# Patient Record
Sex: Female | Born: 1990 | Race: White | Hispanic: No | Smoking: Current every day smoker
Health system: Southern US, Community
[De-identification: ages and names within clinical notes are randomized; demographics above are authoritative.]

## PROBLEM LIST (undated history)

## (undated) ENCOUNTER — Inpatient Hospital Stay (HOSPITAL_COMMUNITY): Payer: Self-pay

## (undated) DIAGNOSIS — N390 Urinary tract infection, site not specified: Secondary | ICD-10-CM

## (undated) DIAGNOSIS — N898 Other specified noninflammatory disorders of vagina: Secondary | ICD-10-CM

## (undated) DIAGNOSIS — R519 Headache, unspecified: Secondary | ICD-10-CM

## (undated) DIAGNOSIS — Z3490 Encounter for supervision of normal pregnancy, unspecified, unspecified trimester: Secondary | ICD-10-CM

## (undated) DIAGNOSIS — O021 Missed abortion: Secondary | ICD-10-CM

## (undated) DIAGNOSIS — F419 Anxiety disorder, unspecified: Secondary | ICD-10-CM

## (undated) DIAGNOSIS — F32A Depression, unspecified: Secondary | ICD-10-CM

## (undated) DIAGNOSIS — R51 Headache: Secondary | ICD-10-CM

## (undated) HISTORY — DX: Missed abortion: O02.1

## (undated) HISTORY — PX: DILATION AND CURETTAGE OF UTERUS: SHX78

## (undated) HISTORY — DX: Depression, unspecified: F32.A

## (undated) HISTORY — DX: Other specified noninflammatory disorders of vagina: N89.8

## (undated) HISTORY — DX: Urinary tract infection, site not specified: N39.0

## (undated) HISTORY — DX: Encounter for supervision of normal pregnancy, unspecified, unspecified trimester: Z34.90

---
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
## 2011-04-16 ENCOUNTER — Emergency Department (HOSPITAL_COMMUNITY)
Admission: EM | Admit: 2011-04-16 | Discharge: 2011-04-17 | Disposition: A | Discharge status: Home / Self Care | Payer: Medicaid Other | Attending: Emergency Medicine | Admitting: Emergency Medicine

## 2011-04-16 DIAGNOSIS — N12 Tubulo-interstitial nephritis, not specified as acute or chronic: Secondary | ICD-10-CM | POA: Insufficient documentation

## 2011-04-16 DIAGNOSIS — F17200 Nicotine dependence, unspecified, uncomplicated: Secondary | ICD-10-CM | POA: Insufficient documentation

## 2011-04-16 LAB — DIFFERENTIAL
Basophils Absolute: 0 10*3/uL (ref 0.0–0.1)
Basophils Relative: 0 % (ref 0–1)
Eosinophils Absolute: 0.1 10*3/uL (ref 0.0–0.7)
Eosinophils Relative: 1 % (ref 0–5)
Lymphocytes Relative: 37 % (ref 12–46)
Lymphs Abs: 3.7 10*3/uL (ref 0.7–4.0)
Monocytes Absolute: 0.8 10*3/uL (ref 0.1–1.0)
Neutro Abs: 5.4 10*3/uL (ref 1.7–7.7)
Neutrophils Relative %: 54 % (ref 43–77)

## 2011-04-16 LAB — CBC
Hemoglobin: 14.6 g/dL (ref 12.0–15.0)
MCV: 87.7 fL (ref 78.0–100.0)
Platelets: 217 10*3/uL (ref 150–400)
RBC: 5.11 MIL/uL (ref 3.87–5.11)
RDW: 13.4 % (ref 11.5–15.5)
WBC: 10.1 10*3/uL (ref 4.0–10.5)

## 2011-04-16 LAB — BASIC METABOLIC PANEL
CO2: 24 mEq/L (ref 19–32)
Chloride: 103 mEq/L (ref 96–112)
Creatinine, Ser: 0.79 mg/dL (ref 0.50–1.10)
Potassium: 3.8 mEq/L (ref 3.5–5.1)
Sodium: 140 mEq/L (ref 135–145)

## 2011-04-16 LAB — URINALYSIS, ROUTINE W REFLEX MICROSCOPIC
Bilirubin Urine: NEGATIVE
Glucose, UA: NEGATIVE mg/dL
Ketones, ur: NEGATIVE mg/dL
Nitrite: POSITIVE — AB
Specific Gravity, Urine: 1.025 (ref 1.005–1.030)
pH: 7 (ref 5.0–8.0)

## 2011-04-16 LAB — PREGNANCY, URINE: Preg Test, Ur: NEGATIVE

## 2011-04-16 LAB — URINE MICROSCOPIC-ADD ON

## 2011-04-16 MED ORDER — CIPROFLOXACIN HCL 500 MG PO TABS: 500.0000 mg | ORAL_TABLET | Freq: Two times a day (BID) | ORAL | Status: AC

## 2011-04-16 MED ORDER — HYDROCODONE-ACETAMINOPHEN 5-325 MG PO TABS
1.0000 | ORAL_TABLET | Freq: Once | ORAL | Status: AC
  Administered 2011-04-16: 1 via ORAL

## 2011-04-16 MED ORDER — ONDANSETRON HCL 4 MG/2ML IJ SOLN
4.0000 mg | Freq: Once | INTRAMUSCULAR | Status: AC
  Administered 2011-04-16: 4 mg via INTRAVENOUS
  Filled 2011-04-16: qty 2

## 2011-04-16 MED ORDER — KETOROLAC TROMETHAMINE 30 MG/ML IJ SOLN
30.0000 mg | Freq: Once | INTRAMUSCULAR | Status: AC
  Administered 2011-04-16: 30 mg via INTRAVENOUS
  Filled 2011-04-16: qty 1

## 2011-04-16 MED ORDER — HYDROCODONE-ACETAMINOPHEN 5-325 MG PO TABS
1.0000 | ORAL_TABLET | Freq: Once | ORAL | Status: DC
  Filled 2011-04-16 (×2): qty 1

## 2011-04-16 MED ORDER — ONDANSETRON HCL 4 MG PO TABS: 4.0000 mg | ORAL_TABLET | Freq: Four times a day (QID) | ORAL | Status: AC

## 2011-04-16 MED ORDER — DEXTROSE 5 % IV SOLN
1.0000 g | Freq: Once | INTRAVENOUS | Status: AC
  Administered 2011-04-16: 21:00:00 via INTRAVENOUS
  Filled 2011-04-16: qty 10

## 2011-04-16 MED ORDER — SODIUM CHLORIDE 0.9 % IV BOLUS (SEPSIS)
1000.0000 mL | Freq: Once | INTRAVENOUS | Status: AC
  Administered 2011-04-16: 1000 mL via INTRAVENOUS

## 2011-04-16 MED ORDER — HYDROCODONE-ACETAMINOPHEN 5-325 MG PO TABS: 2.0000 | ORAL_TABLET | ORAL | Status: AC | PRN

## 2011-04-16 NOTE — ED Notes (Signed)
Patient was given a sprite to drink. Patient tolerated  Well.

## 2011-04-16 NOTE — ED Provider Notes (Signed)
History     CSN: [id number] Arrival date & time: 04/16/2011  7:54 PM   First MD Initiated Contact with Patient 04/16/11 2026      Chief Complaint  Patient presents with  . Abdominal Pain  . Flank Pain  . Dysuria    (Consider location/radiation/quality/duration/timing/severity/associated sxs/prior treatment) HPI Comments: Patient presents with one week of suprapubic pain that has been coming and going but has become more persistent in the past couple days. Today she first noticed some dysuria and pain radiating up to her left flank. She endorses nausea but has not had any vomiting. She denies any vaginal bleeding or discharge, and her last menstrual period was earlier this month. She has implanon birth control.  She is sexually active and uses protection occasionally. Denies any chest pain, shortness of breath, fever. Her abdominal pain is localized to the suprapubic region. There no provoking or palliative factors. She is using Tylenol at home for the pain.  The history is provided by the patient.    History reviewed. No pertinent past medical history.  History reviewed. No pertinent past surgical history.  No family history on file.  History  Substance Use Topics  . Smoking status: Current Everyday Smoker -- 0.5 packs/day  . Smokeless tobacco: Not on file  . Alcohol Use: No    OB History    Grav Para Term Preterm Abortions TAB SAB Ect Mult Living                  Review of Systems  Constitutional: Positive for chills, activity change and appetite change. Negative for fever.  HENT: Negative for congestion and rhinorrhea.   Respiratory: Negative for cough, chest tightness and shortness of breath.   Cardiovascular: Negative for chest pain.  Gastrointestinal: Positive for nausea and abdominal pain. Negative for vomiting and diarrhea.  Genitourinary: Positive for dysuria, urgency, frequency, flank pain and pelvic pain. Negative for vaginal bleeding and vaginal discharge.    Musculoskeletal: Positive for back pain.  Skin: Negative for rash.  Neurological: Negative for headaches.    Allergies  Codeine  Home Medications   Current Outpatient Rx  Name Route Sig Dispense Refill  . CIPROFLOXACIN HCL 500 MG PO TABS Oral Take 1 tablet (500 mg total) by mouth every 12 (twelve) hours. 28 tablet 0  . HYDROCODONE-ACETAMINOPHEN 5-325 MG PO TABS Oral Take 2 tablets by mouth every 4 (four) hours as needed for pain. 10 tablet 0  . ONDANSETRON HCL 4 MG PO TABS Oral Take 1 tablet (4 mg total) by mouth every 6 (six) hours. 12 tablet 0    BP 125/64  Pulse 88  Temp(Src) 98.2 F (36.8 C) (Oral)  Resp 18  Ht 5\' 3"  (1.6 m)  Wt 135 lb (61.236 kg)  BMI 23.91 kg/m2  SpO2 100%  LMP 03/05/2011  Physical Exam  Constitutional: She appears well-developed and well-nourished. No distress.  HENT:  Head: Normocephalic and atraumatic.  Mouth/Throat: Oropharynx is clear and moist. No oropharyngeal exudate.  Eyes: Conjunctivae are normal. Pupils are equal, round, and reactive to light.  Neck: Normal range of motion.  Cardiovascular: Normal rate, regular rhythm and normal heart sounds.   Pulmonary/Chest: Effort normal and breath sounds normal. No respiratory distress.  Abdominal: Soft. There is tenderness. There is no rebound and no guarding.       Suprapubic tenderness that is moderate. No guarding no rebound no pain at McBurney's point no pain or Murphy's point  Musculoskeletal: She exhibits tenderness.  Left CVA tenderness  Neurological: She is alert. No cranial nerve deficit.  Skin: Skin is warm.    ED Course  Procedures (including critical care time)  Labs Reviewed  URINALYSIS, ROUTINE W REFLEX MICROSCOPIC - Abnormal; Notable for the following:    Appearance CLOUDY (*)    Hgb urine dipstick TRACE (*)    Nitrite POSITIVE (*)    Leukocytes, UA LARGE (*)    All other components within normal limits  URINE MICROSCOPIC-ADD ON - Abnormal; Notable for the following:     Squamous Epithelial / LPF FEW (*)    Bacteria, UA MANY (*)    All other components within normal limits  PREGNANCY, URINE  CBC  DIFFERENTIAL  BASIC METABOLIC PANEL  WET PREP, GENITAL  GC/CHLAMYDIA PROBE AMP, GENITAL  URINE CULTURE   No results found.   1. Pyelonephritis       MDM  Suprapubic pain with flank pain and dysuria. Presentation is concerning for p pyelonephritis, kidney stone, urinary tract infection, cervicitis, PID.  Patient is refusing pelvic exam.  Urinalysis is grossly infected. With flank pain, will treat for pyelonephritis with IV Rocephin in the ED. She is tolerating by mouth and will be able to be discharged on by mouth antibiotics.        Glynn Octave, MD 04/16/11 [phone]

## 2011-04-16 NOTE — ED Notes (Signed)
Pt presents with abd pain, flank pain, and painful urination. Pt states painful urination started approx 2 days ago. Pt also states she is nauseated.

## 2011-04-16 NOTE — ED Notes (Signed)
Pt refusing pelvic exam.

## 2011-04-19 LAB — URINE CULTURE
Colony Count: >=100000
Culture  Setup Time: 201211191245

## 2011-04-20 NOTE — ED Notes (Signed)
Positive URNC- Treated per protocol  

## 2011-11-14 ENCOUNTER — Emergency Department (HOSPITAL_COMMUNITY)
Admission: EM | Admit: 2011-11-14 | Discharge: 2011-11-14 | Discharge status: Against Medical Advice | Payer: Self-pay | Attending: Emergency Medicine | Admitting: Emergency Medicine

## 2011-11-14 ENCOUNTER — Encounter (HOSPITAL_COMMUNITY): Payer: Self-pay

## 2011-11-14 DIAGNOSIS — R1011 Right upper quadrant pain: Secondary | ICD-10-CM | POA: Insufficient documentation

## 2011-11-14 DIAGNOSIS — R110 Nausea: Secondary | ICD-10-CM | POA: Insufficient documentation

## 2011-11-14 DIAGNOSIS — R109 Unspecified abdominal pain: Secondary | ICD-10-CM

## 2011-11-14 LAB — URINALYSIS, ROUTINE W REFLEX MICROSCOPIC
Bilirubin Urine: NEGATIVE
Glucose, UA: NEGATIVE mg/dL
Ketones, ur: NEGATIVE mg/dL
Protein, ur: NEGATIVE mg/dL
pH: 6 (ref 5.0–8.0)

## 2011-11-14 LAB — URINE MICROSCOPIC-ADD ON

## 2011-11-14 NOTE — ED Notes (Signed)
Pt instructed in getting an urine sample for lab, states upper abd pain for a month with nausea, denies vomiting or diarrhea

## 2011-11-14 NOTE — ED Notes (Signed)
Pt c/o upper abd pain x 1 month.  Says feels nauseated and has been up all night due to pain.  C/O feeling dizzy.

## 2011-11-14 NOTE — ED Notes (Signed)
Staff not aware that pt had left AMA, room empty, noted per EDP's note that he is aware of pt's leaving

## 2011-11-14 NOTE — ED Provider Notes (Signed)
History   This chart was scribed for Ann Jakes, MD by Shari Heritage. The patient was seen in room APA09/APA09. Patient's care was started at 1731.     CSN: [id number]  Arrival date & time 11/14/11  1731   First MD Initiated Contact with Patient 11/14/11 2204      Chief Complaint  Patient presents with  . Abdominal Pain    (Consider location/radiation/quality/duration/timing/severity/associated sxs/prior treatment) Patient is a 21 y.o. female presenting with abdominal pain. The history is provided by the patient. No language interpreter was used.  Abdominal Pain The primary symptoms of the illness include abdominal pain and nausea. The primary symptoms of the illness do not include fever, shortness of breath, vomiting, diarrhea or dysuria. The current episode started more than 2 days ago.  The abdominal pain began more than 2 days ago. The abdominal pain has been unchanged since its onset. The abdominal pain is located in the RUQ. The abdominal pain does not radiate. The severity of the abdominal pain is 6/10. The abdominal pain is relieved by nothing.  The patient states that she believes she is currently not pregnant. Additional symptoms associated with the illness include back pain. Symptoms associated with the illness do not include chills.   Ann Moore is a 21 y.o. female who presents to the Emergency Department complaining of constant, moderate to severe RUQ abdominal pain onset 1 month ago with associated nausea and dizziness onset several days ago. Patient ranks pain as 6/10. Patient says pain does not radiate. Patient has had no episodes of emesis or diarrhea. Patient has also experienced HA and back pain. Patient denies diarrhea, vomiting, fever, sore throat, congestion, SOB, chest pain, neck pain, rash, no problems with bleeding easily. Patient's LMS was 11/07/2011. Patient is a current everyday smoker. Patient is allergic to hydrocodone.  History reviewed. No  pertinent past medical history.  History reviewed. No pertinent past surgical history.  No family history on file.  History  Substance Use Topics  . Smoking status: Current Everyday Smoker -- 0.5 packs/day  . Smokeless tobacco: Not on file  . Alcohol Use: No    OB History    Grav Para Term Preterm Abortions TAB SAB Ect Mult Living                  Review of Systems  Constitutional: Negative for fever and chills.  HENT: Negative for congestion, sore throat and neck pain.   Eyes: Negative for visual disturbance.  Respiratory: Negative for shortness of breath.   Cardiovascular: Negative for chest pain.  Gastrointestinal: Positive for nausea and abdominal pain. Negative for vomiting and diarrhea.  Genitourinary: Negative for dysuria.  Musculoskeletal: Positive for back pain.  Skin: Negative for rash.  Neurological: Positive for headaches.   Patient is positive for HA and back pain.  Patient is negative for visual disturbance, diarrhea, vomiting, fever, sore throat, congestion, SOB, chest pain, neck pain, and rash.  Allergies  Codeine and Hydrocodone  Home Medications   Current Outpatient Rx  Name Route Sig Dispense Refill  . ACETAMINOPHEN 500 MG PO TABS Oral Take 1,000 mg by mouth as needed. For pain    . ETONOGESTREL 68 MG [place] IMPL Subcutaneous Inject 1 each into the skin once.      BP 136/87  Pulse 108  Temp 98.5 F (36.9 C) (Oral)  Resp 18  Ht 5\' 4"  (1.626 m)  Wt 140 lb (63.504 kg)  BMI 24.03 kg/m2  SpO2 100%  LMP  11/07/2011  Physical Exam  Nursing note and vitals reviewed. Constitutional: She is oriented to person, place, and time. She appears well-developed and well-nourished.  HENT:  Head: Normocephalic and atraumatic.  Eyes: Conjunctivae and EOM are normal. Pupils are equal, round, and reactive to light.  Neck: Normal range of motion. Neck supple.  Cardiovascular: Normal rate, regular rhythm and normal heart sounds.   No murmur  heard. Pulmonary/Chest: Effort normal and breath sounds normal. No respiratory distress. She has no wheezes. She has no rales.  Abdominal: Soft. Bowel sounds are normal. There is tenderness (RUQ). There is guarding.  Musculoskeletal: Normal range of motion.  Neurological: She is alert and oriented to person, place, and time.  Skin: Skin is warm and dry.  Psychiatric: She has a normal mood and affect.    ED Course  Procedures (including critical care time) DIAGNOSTIC STUDIES: Oxygen Saturation is 100% on room air, normal by my interpretation.    COORDINATION OF CARE: 10:12PM- Patient informed of current plan for treatment and evaluation and agrees with plan at this time. Patient has tenderness in RUQ above gallbladder. Patient is leaving ED against medical advice.   Results for orders placed during the hospital encounter of 11/14/11  URINALYSIS, ROUTINE W REFLEX MICROSCOPIC      Component Value Range   Color, Urine YELLOW  YELLOW   APPearance CLEAR  CLEAR   Specific Gravity, Urine 1.025  1.005 - 1.030   pH 6.0  5.0 - 8.0   Glucose, UA NEGATIVE  NEGATIVE mg/dL   Hgb urine dipstick SMALL (*) NEGATIVE   Bilirubin Urine NEGATIVE  NEGATIVE   Ketones, ur NEGATIVE  NEGATIVE mg/dL   Protein, ur NEGATIVE  NEGATIVE mg/dL   Urobilinogen, UA 0.2  0.0 - 1.0 mg/dL   Nitrite NEGATIVE  NEGATIVE   Leukocytes, UA NEGATIVE  NEGATIVE  PREGNANCY, URINE      Component Value Range   Preg Test, Ur NEGATIVE  NEGATIVE  URINE MICROSCOPIC-ADD ON      Component Value Range   Squamous Epithelial / LPF MANY (*) RARE   WBC, UA 0-2  <3 WBC/hpf   RBC / HPF 0-2  <3 RBC/hpf   Bacteria, UA FEW (*) RARE   No results found.   1. Abdominal pain       MDM  Patient insists on leaving AMA stated the patient at the concern is for acute cholecystitis she's had the upper quadrant abdominal pain for a month and now has a sniffing and tenderness in right upper quadrant with guarding symptoms could be related to  gallstones and now is gone into acute cholecystitis patient does know that she could die from this insists that she needs to go home which would come back another day. Was unable to talk patient into staying.      I personally performed the services described in this documentation, which was scribed in my presence. The recorded information has been reviewed and considered.     Ann Jakes, MD 11/14/11 2226

## 2011-11-14 NOTE — ED Notes (Signed)
MD at bedside. 

## 2011-11-18 ENCOUNTER — Encounter (HOSPITAL_COMMUNITY): Payer: Self-pay | Admitting: *Deleted

## 2011-11-18 ENCOUNTER — Emergency Department (HOSPITAL_COMMUNITY)
Admission: EM | Admit: 2011-11-18 | Discharge: 2011-11-18 | Disposition: A | Discharge status: Home / Self Care | Payer: Self-pay | Attending: Emergency Medicine | Admitting: Emergency Medicine

## 2011-11-18 DIAGNOSIS — R110 Nausea: Secondary | ICD-10-CM | POA: Insufficient documentation

## 2011-11-18 DIAGNOSIS — F17200 Nicotine dependence, unspecified, uncomplicated: Secondary | ICD-10-CM | POA: Insufficient documentation

## 2011-11-18 DIAGNOSIS — R197 Diarrhea, unspecified: Secondary | ICD-10-CM | POA: Insufficient documentation

## 2011-11-18 DIAGNOSIS — R109 Unspecified abdominal pain: Secondary | ICD-10-CM | POA: Insufficient documentation

## 2011-11-18 LAB — COMPREHENSIVE METABOLIC PANEL
ALT: 9 U/L (ref 0–35)
AST: 16 U/L (ref 0–37)
Alkaline Phosphatase: 89 U/L (ref 39–117)
CO2: 24 mEq/L (ref 19–32)
Calcium: 10.1 mg/dL (ref 8.4–10.5)
GFR calc Af Amer: >90 mL/min (ref >90)
GFR calc non Af Amer: >90 mL/min (ref >90)
Glucose, Bld: 98 mg/dL (ref 70–99)
Potassium: 3.6 mEq/L (ref 3.5–5.1)
Sodium: 136 mEq/L (ref 135–145)
Total Protein: 7.8 g/dL (ref 6.0–8.3)

## 2011-11-18 LAB — CBC
Hemoglobin: 15.1 g/dL — ABNORMAL HIGH (ref 12.0–15.0)
Platelets: 218 10*3/uL (ref 150–400)
RBC: 5.11 MIL/uL (ref 3.87–5.11)

## 2011-11-18 MED ORDER — ONDANSETRON 8 MG PO TBDP
8.0000 mg | ORAL_TABLET | Freq: Once | ORAL | Status: AC
  Administered 2011-11-18: 8 mg via ORAL
  Filled 2011-11-18: qty 1

## 2011-11-18 MED ORDER — OXYCODONE-ACETAMINOPHEN 5-325 MG PO TABS: 1.0000 | ORAL_TABLET | ORAL | Status: AC | PRN

## 2011-11-18 MED ORDER — OXYCODONE-ACETAMINOPHEN 5-325 MG PO TABS
2.0000 | ORAL_TABLET | Freq: Once | ORAL | Status: AC
  Administered 2011-11-18: 2 via ORAL
  Filled 2011-11-18: qty 2

## 2011-11-18 NOTE — Discharge Instructions (Signed)

## 2011-11-18 NOTE — ED Provider Notes (Signed)
History     CSN: [id number]  Arrival date & time 11/18/11  1309   First MD Initiated Contact with Patient 11/18/11 1504      Chief Complaint  Patient presents with  . Abdominal Pain     Patient is a 21 y.o. female presenting with abdominal pain. The history is provided by the patient.  Abdominal Pain The primary symptoms of the illness include abdominal pain, nausea and diarrhea. The primary symptoms of the illness do not include fever, shortness of breath, vomiting, hematemesis, hematochezia, dysuria, vaginal discharge or vaginal bleeding. The current episode started more than 2 days ago. The onset of the illness was gradual. The problem has been gradually worsening.  Symptoms associated with the illness do not include urgency, frequency or back pain.  pt reports abdominal pain for one month Not worse with food No h/o abdominal surgery Just developed diarrhea in past 24 hours Recent ER evaluation but left before full workup could be completed No cp/sob No cough  PMH - none History reviewed. No pertinent past surgical history.  No family history on file.  History  Substance Use Topics  . Smoking status: Current Everyday Smoker -- 0.5 packs/day  . Smokeless tobacco: Not on file  . Alcohol Use: No    OB History    Grav Para Term Preterm Abortions TAB SAB Ect Mult Living                  Review of Systems  Constitutional: Negative for fever.  Respiratory: Negative for shortness of breath.   Gastrointestinal: Positive for nausea, abdominal pain and diarrhea. Negative for vomiting, hematochezia and hematemesis.  Genitourinary: Negative for dysuria, urgency, frequency, vaginal bleeding and vaginal discharge.  Musculoskeletal: Negative for back pain.  All other systems reviewed and are negative.    Allergies  Codeine and Hydrocodone  Home Medications   Current Outpatient Rx  Name Route Sig Dispense Refill  . ACETAMINOPHEN 500 MG PO TABS Oral Take 1,000 mg by  mouth as needed. For pain    . ETONOGESTREL 68 MG [place] IMPL Subcutaneous Inject 1 each into the skin once.      BP 134/91  Pulse 105  Temp 98.2 F (36.8 C) (Oral)  Resp 16  Ht 5\' 4"  (1.626 m)  Wt 140 lb (63.504 kg)  BMI 24.03 kg/m2  SpO2 100%  LMP 11/07/2011  Physical Exam CONSTITUTIONAL: Well developed/well nourished HEAD AND FACE: Normocephalic/atraumatic EYES: EOMI/PERRL, no icterus ENMT: Mucous membranes moist NECK: supple no meningeal signs SPINE:entire spine nontender CV: S1/S2 noted, no murmurs/rubs/gallops noted LUNGS: Lungs are clear to auscultation bilaterally, no apparent distress ABDOMEN: soft, nontender, no rebound or guarding GU:no cva tenderness NEURO: Pt is awake/alert, moves all extremitiesx4 EXTREMITIES: pulses normal, full ROM SKIN: warm, color normal PSYCH: no abnormalities of mood noted  ED Course  Procedures   Labs Reviewed  CBC - Abnormal; Notable for the following:    Hemoglobin 15.1 (*)     All other components within normal limits  COMPREHENSIVE METABOLIC PANEL  LIPASE, BLOOD   Pt well appearing, abdomen soft with no focal tenderness on repeat exam, no vomiting abd pain for one month, doubt acute abd process.  Advised PCP followup and may need outpatient ultrasound but I doubt acute cholecystitis at this time  The patient appears reasonably screened and/or stabilized for discharge and I doubt any other medical condition or other [facility] requiring further screening, evaluation, or treatment in the ED at this time prior to discharge.  MDM  Nursing notes including past medical history and social history reviewed and considered in documentation All labs/vitals reviewed and considered Previous records reviewed and considered - recent ED evaluation, concern for cholecystitis at that time         Joya Gaskins, MD 11/18/11 1702

## 2011-11-18 NOTE — ED Notes (Signed)
Abdominal pain x 1 mo. Was here Tuesday for same but had to leave AMA , per pt. PT states diarrhea began yesterday. Denies vomiting but states nausea at times. NAD.

## 2013-01-03 ENCOUNTER — Telehealth: Payer: Self-pay | Admitting: Advanced Practice Midwife

## 2013-01-03 NOTE — Telephone Encounter (Signed)
Pt states period started last Thursday, usually does not have period on her BC, lo loestrin FE. Pt states was given abx for dental work thinks might have caused bleeding. Pt states given prescription for megace in the past. Pt informed to have pharmacy to send RX request. Pt verbalized understanding.

## 2013-01-03 NOTE — Telephone Encounter (Signed)
Error 01/03/13. sn

## 2013-01-06 ENCOUNTER — Other Ambulatory Visit: Payer: Self-pay | Admitting: *Deleted

## 2013-01-07 ENCOUNTER — Other Ambulatory Visit: Payer: Self-pay | Admitting: *Deleted

## 2013-01-08 MED ORDER — MEGESTROL ACETATE 40 MG PO TABS: 40.0000 mg | ORAL_TABLET | Freq: Every day | ORAL | Status: DC

## 2013-01-08 MED ORDER — MEGESTROL ACETATE 40 MG PO TABS: ORAL_TABLET | ORAL | Status: DC

## 2013-02-15 ENCOUNTER — Encounter (HOSPITAL_COMMUNITY): Payer: Self-pay | Admitting: Emergency Medicine

## 2013-02-15 ENCOUNTER — Emergency Department (HOSPITAL_COMMUNITY)
Admission: EM | Admit: 2013-02-15 | Discharge: 2013-02-15 | Disposition: A | Discharge status: Home / Self Care | Payer: Medicaid Other | Attending: Emergency Medicine | Admitting: Emergency Medicine

## 2013-02-15 DIAGNOSIS — R21 Rash and other nonspecific skin eruption: Secondary | ICD-10-CM | POA: Insufficient documentation

## 2013-02-15 DIAGNOSIS — J029 Acute pharyngitis, unspecified: Secondary | ICD-10-CM | POA: Insufficient documentation

## 2013-02-15 DIAGNOSIS — T7840XA Allergy, unspecified, initial encounter: Secondary | ICD-10-CM

## 2013-02-15 DIAGNOSIS — F17200 Nicotine dependence, unspecified, uncomplicated: Secondary | ICD-10-CM | POA: Insufficient documentation

## 2013-02-15 DIAGNOSIS — Z79899 Other long term (current) drug therapy: Secondary | ICD-10-CM | POA: Insufficient documentation

## 2013-02-15 MED ORDER — PREDNISONE 50 MG PO TABS
60.0000 mg | ORAL_TABLET | Freq: Once | ORAL | Status: AC
  Administered 2013-02-15: 60 mg via ORAL
  Filled 2013-02-15: qty 1

## 2013-02-15 MED ORDER — DIPHENHYDRAMINE HCL 25 MG PO CAPS
25.0000 mg | ORAL_CAPSULE | Freq: Once | ORAL | Status: AC
  Administered 2013-02-15: 25 mg via ORAL
  Filled 2013-02-15: qty 1

## 2013-02-15 MED ORDER — FAMOTIDINE 20 MG PO TABS
20.0000 mg | ORAL_TABLET | Freq: Once | ORAL | Status: AC
  Administered 2013-02-15: 20 mg via ORAL
  Filled 2013-02-15: qty 1

## 2013-02-15 MED ORDER — PREDNISONE 10 MG PO TABS: ORAL_TABLET | ORAL | Status: DC

## 2013-02-15 MED ORDER — FAMOTIDINE 20 MG PO TABS: 20.0000 mg | ORAL_TABLET | Freq: Two times a day (BID) | ORAL | Status: DC

## 2013-02-15 NOTE — ED Provider Notes (Signed)
CSN: [id number]     Arrival date & time 02/15/13  1149 History   First MD Initiated Contact with Patient 02/15/13 1218     Chief Complaint  Patient presents with  . Rash   (Consider location/radiation/quality/duration/timing/severity/associated sxs/prior Treatment) Patient is a 22 y.o. female presenting with rash. The history is provided by the patient.  Rash Location:  Full body Quality: itchiness   Severity:  Moderate Onset quality:  Gradual Duration:  4 days Timing:  Constant Progression:  Worsening Chronicity:  New Relieved by:  Nothing Associated symptoms: sore throat   Associated symptoms: no abdominal pain, no fever, no headaches, no nausea, no shortness of breath, not vomiting and not wheezing    Ann Moore is a 22 y.o. female who presents to the ED with a rash. She started a few days ago with a sore throat. She woke yesterday with itching and mild rash and swelling of her face. She took Benadryl and it helped some. Today the rash is worse and has spread to the arms and trunk and upper legs. She has had allergic reactions in the past but the Benadryl usually is all she needs.   History reviewed. No pertinent past medical history. History reviewed. No pertinent past surgical history. History reviewed. No pertinent family history. History  Substance Use Topics  . Smoking status: Current Every Day Smoker -- 0.50 packs/day  . Smokeless tobacco: Not on file  . Alcohol Use: No   OB History   Grav Para Term Preterm Abortions TAB SAB Ect Mult Living                 Review of Systems  Constitutional: Negative for fever and chills.  HENT: Positive for sore throat. Negative for congestion, trouble swallowing, neck pain and dental problem.   Eyes: Negative for redness.  Respiratory: Negative for shortness of breath and wheezing.   Gastrointestinal: Negative for nausea, vomiting and abdominal pain.  Genitourinary: Negative for frequency.  Musculoskeletal: Negative for  back pain.  Skin: Positive for rash.  Neurological: Negative for headaches.  Psychiatric/Behavioral: The patient is not nervous/anxious.     Allergies  Codeine and Hydrocodone  Home Medications   Current Outpatient Rx  Name  Route  Sig  Dispense  Refill  . megestrol (MEGACE) 40 MG tablet   Oral   Take 1 tablet (40 mg total) by mouth daily.   60 tablet   2   . Norethindrone-Ethinyl Estradiol-Fe Biphas (LO LOESTRIN FE) 1 MG-10 MCG / 10 MCG tablet   Oral   Take 1 tablet by mouth daily.          BP 127/92  Pulse 82  Temp(Src) 98 F (36.7 C) (Oral)  Resp 18  SpO2 100% Physical Exam  Nursing note and vitals reviewed. Constitutional: She is oriented to person, place, and time. She appears well-developed and well-nourished. No distress.  HENT:  Head: Normocephalic and atraumatic.  Mouth/Throat: Uvula is midline and mucous membranes are normal. Posterior oropharyngeal erythema present.  Eyes: Conjunctivae and EOM are normal.  Neck: Neck supple.  Cardiovascular: Normal rate, regular rhythm and normal heart sounds.   Pulmonary/Chest: Effort normal and breath sounds normal. She has no wheezes.  Abdominal: Soft. There is no tenderness.  Musculoskeletal: Normal range of motion.  Neurological: She is alert and oriented to person, place, and time. No cranial nerve deficit.  Skin:  Minimal facial swelling, raised red rash face, neck, arms and trunk.   Psychiatric: She has a normal  mood and affect. Her behavior is normal.    ED Course: Dr. Jerelyn Charles in to examine the patient. Will treat as allergic reaction.  Procedures  MDM  22 y.o. female with allergic dermatitis. Will treat with prednisone, Pepcid and she will continue Benadryl. She is stable for discharge home without any immediate complications. Vital signs stable  O2 SAT 100% on R/A.  Discussed with the patient and all questioned fully answered. She will return if any problems arise.    Medication List    TAKE these  medications       famotidine 20 MG tablet  Commonly known as:  PEPCID  Take 1 tablet (20 mg total) by mouth 2 (two) times daily.     predniSONE 10 MG tablet  Commonly known as:  DELTASONE  Take 2 tablets by mouth twice a day starting 02/16/13.      ASK your doctor about these medications       LO LOESTRIN FE 1 MG-10 MCG / 10 MCG tablet  Generic drug:  Norethindrone-Ethinyl Estradiol-Fe Biphas  Take 1 tablet by mouth daily.     megestrol 40 MG tablet  Commonly known as:  MEGACE  Take 1 tablet (40 mg total) by mouth daily.           [place], [place] 02/15/13 [phone]

## 2013-02-15 NOTE — ED Notes (Signed)
Pt presents with rash that spreads throughout entire body. Pt first notice red spots on her abdomen 3-4 days ago. Pt states symptoms has progressively worsened.

## 2013-02-16 NOTE — ED Provider Notes (Signed)
Medical screening examination/treatment/procedure(s) were conducted as a shared visit with non-physician practitioner(s) and myself.  I personally evaluated the patient during the encounter  Allergic appearing. Home with steroids, H1 and H2 blockade. No signs of anaphylaxis  Lyanne Co, MD 02/16/13 [phone]

## 2013-02-17 LAB — CULTURE, GROUP A STREP

## 2013-04-28 ENCOUNTER — Encounter (HOSPITAL_COMMUNITY): Payer: Self-pay | Admitting: Emergency Medicine

## 2013-04-28 ENCOUNTER — Emergency Department (HOSPITAL_COMMUNITY)
Admission: EM | Admit: 2013-04-28 | Discharge: 2013-04-28 | Disposition: A | Discharge status: Home / Self Care | Payer: Medicaid Other | Attending: Emergency Medicine | Admitting: Emergency Medicine

## 2013-04-28 DIAGNOSIS — R112 Nausea with vomiting, unspecified: Secondary | ICD-10-CM | POA: Insufficient documentation

## 2013-04-28 DIAGNOSIS — F17200 Nicotine dependence, unspecified, uncomplicated: Secondary | ICD-10-CM | POA: Insufficient documentation

## 2013-04-28 DIAGNOSIS — Z79899 Other long term (current) drug therapy: Secondary | ICD-10-CM | POA: Insufficient documentation

## 2013-04-28 DIAGNOSIS — R109 Unspecified abdominal pain: Secondary | ICD-10-CM

## 2013-04-28 DIAGNOSIS — N898 Other specified noninflammatory disorders of vagina: Secondary | ICD-10-CM | POA: Insufficient documentation

## 2013-04-28 LAB — CBC WITH DIFFERENTIAL/PLATELET
Basophils Absolute: 0 10*3/uL (ref 0.0–0.1)
Basophils Relative: 0 % (ref 0–1)
Eosinophils Relative: 2 % (ref 0–5)
HCT: 42.8 % (ref 36.0–46.0)
MCHC: 33.2 g/dL (ref 30.0–36.0)
MCV: 89.5 fL (ref 78.0–100.0)
Monocytes Absolute: 0.8 10*3/uL (ref 0.1–1.0)
Monocytes Relative: 10 % (ref 3–12)
Neutrophils Relative %: 52 % (ref 43–77)
RBC: 4.78 MIL/uL (ref 3.87–5.11)
RDW: 13.3 % (ref 11.5–15.5)

## 2013-04-28 LAB — BASIC METABOLIC PANEL
Calcium: 10.1 mg/dL (ref 8.4–10.5)
Creatinine, Ser: 0.81 mg/dL (ref 0.50–1.10)
GFR calc Af Amer: >90 mL/min (ref >90)
GFR calc non Af Amer: >90 mL/min (ref >90)

## 2013-04-28 LAB — URINALYSIS, ROUTINE W REFLEX MICROSCOPIC
Glucose, UA: NEGATIVE mg/dL
Ketones, ur: NEGATIVE mg/dL
Leukocytes, UA: NEGATIVE
Protein, ur: NEGATIVE mg/dL
Urobilinogen, UA: 0.2 mg/dL (ref 0.0–1.0)

## 2013-04-28 LAB — URINE MICROSCOPIC-ADD ON

## 2013-04-28 LAB — HCG, SERUM, QUALITATIVE: Preg, Serum: NEGATIVE

## 2013-04-28 MED ORDER — PROMETHAZINE HCL 25 MG PO TABS: 25.0000 mg | ORAL_TABLET | Freq: Four times a day (QID) | ORAL | Status: DC | PRN

## 2013-04-28 NOTE — ED Notes (Signed)
Nausea x2 weeks. Lower abd cramping x 1 week. Wants blood preg test. States urine preg test is always negative. Nad. No s/s of pain at this time

## 2013-04-28 NOTE — ED Provider Notes (Signed)
CSN: [id number]     Arrival date & time 04/28/13  1611 History   First MD Initiated Contact with Patient 04/28/13 1629     Chief Complaint  Patient presents with  . Abdominal Cramping   (Consider location/radiation/quality/duration/timing/severity/associated sxs/prior Treatment) Patient is a 22 y.o. female presenting with cramps. The history is provided by the patient.  Abdominal Cramping Associated symptoms include abdominal pain. Pertinent negatives include no chest pain, no headaches and no shortness of breath.   patient with complaint of lower abdominal intermittent crampy abdominal pain for a week and a half and nausea without vomiting for a week and a half. Patient is concerned that she's pregnant. Patient is gravida 1 para 1 from previous delivery. Last menstrual period was last week of October. Patient is requesting a blood pregnancy test because the urine once have been negative. Patient admits to a slight vaginal discharge but no vaginal pain no significant pelvic pain. Also admits to some to sure you. The abdominal crampy pain is 2/10 nonradiating. Made worse or better by anything.  History reviewed. No pertinent past medical history. History reviewed. No pertinent past surgical history. History reviewed. No pertinent family history. History  Substance Use Topics  . Smoking status: Current Every Day Smoker -- 0.50 packs/day  . Smokeless tobacco: Not on file  . Alcohol Use: Yes     Comment: occ   OB History   Grav Para Term Preterm Abortions TAB SAB Ect Mult Living                 Review of Systems  Constitutional: Negative for fever.  HENT: Negative for congestion.   Eyes: Negative for redness.  Respiratory: Negative for shortness of breath.   Cardiovascular: Negative for chest pain.  Gastrointestinal: Positive for nausea and abdominal pain. Negative for vomiting and diarrhea.  Endocrine: Negative for polydipsia and polyuria.  Genitourinary: Positive for dysuria and  vaginal discharge. Negative for vaginal bleeding and vaginal pain.  Musculoskeletal: Negative for back pain.  Skin: Negative for rash.  Neurological: Negative for headaches.  Hematological: Does not bruise/bleed easily.  Psychiatric/Behavioral: Negative for confusion.    Allergies  Hydrocodone  Home Medications   Current Outpatient Rx  Name  Route  Sig  Dispense  Refill  . megestrol (MEGACE) 40 MG tablet   Oral   Take 1 tablet (40 mg total) by mouth daily.   60 tablet   2   . promethazine (PHENERGAN) 25 MG tablet   Oral   Take 1 tablet (25 mg total) by mouth every 6 (six) hours as needed for nausea or vomiting.   12 tablet   0    BP 131/89  Pulse 79  Temp(Src) 97.8 F (36.6 C) (Oral)  Resp 17  SpO2 100%  LMP 03/25/2013 Physical Exam  Nursing note and vitals reviewed. Constitutional: She is oriented to person, place, and time. She appears well-developed and well-nourished. No distress.  HENT:  Head: Normocephalic and atraumatic.  Mouth/Throat: Oropharynx is clear and moist.  Eyes: Conjunctivae and EOM are normal. Pupils are equal, round, and reactive to light.  Neck: Normal range of motion. Neck supple.  Cardiovascular: Normal rate, regular rhythm and normal heart sounds.   No murmur heard. Pulmonary/Chest: Effort normal and breath sounds normal. No respiratory distress.  Abdominal: Soft. Bowel sounds are normal. She exhibits no mass. There is no tenderness. There is no guarding.  Musculoskeletal: Normal range of motion. She exhibits no edema.  Neurological: She is alert and oriented  to person, place, and time. No cranial nerve deficit. She exhibits abnormal muscle tone. Coordination normal.  Skin: Skin is warm. No rash noted. No erythema.    ED Course  Procedures (including critical care time) Labs Review Labs Reviewed  URINALYSIS, ROUTINE W REFLEX MICROSCOPIC - Abnormal; Notable for the following:    APPearance HAZY (*)    Hgb urine dipstick TRACE (*)     All other components within normal limits  URINE MICROSCOPIC-ADD ON - Abnormal; Notable for the following:    Squamous Epithelial / LPF MANY (*)    Bacteria, UA MANY (*)    All other components within normal limits  URINE CULTURE  CBC WITH DIFFERENTIAL  BASIC METABOLIC PANEL  HCG, SERUM, QUALITATIVE   Results for orders placed during the hospital encounter of 04/28/13  URINALYSIS, ROUTINE W REFLEX MICROSCOPIC      Result Value Range   Color, Urine YELLOW  YELLOW   APPearance HAZY (*) CLEAR   Specific Gravity, Urine 1.025  1.005 - 1.030   pH 6.0  5.0 - 8.0   Glucose, UA NEGATIVE  NEGATIVE mg/dL   Hgb urine dipstick TRACE (*) NEGATIVE   Bilirubin Urine NEGATIVE  NEGATIVE   Ketones, ur NEGATIVE  NEGATIVE mg/dL   Protein, ur NEGATIVE  NEGATIVE mg/dL   Urobilinogen, UA 0.2  0.0 - 1.0 mg/dL   Nitrite NEGATIVE  NEGATIVE   Leukocytes, UA NEGATIVE  NEGATIVE  CBC WITH DIFFERENTIAL      Result Value Range   WBC 7.9  4.0 - 10.5 K/uL   RBC 4.78  3.87 - 5.11 MIL/uL   Hemoglobin 14.2  12.0 - 15.0 g/dL   HCT 16.1  09.6 - 04.5 %   MCV 89.5  78.0 - 100.0 fL   MCH 29.7  26.0 - 34.0 pg   MCHC 33.2  30.0 - 36.0 g/dL   RDW 40.9  81.1 - 91.4 %   Platelets 211  150 - 400 K/uL   Neutrophils Relative % 52  43 - 77 %   Neutro Abs 4.1  1.7 - 7.7 K/uL   Lymphocytes Relative 37  12 - 46 %   Lymphs Abs 2.9  0.7 - 4.0 K/uL   Monocytes Relative 10  3 - 12 %   Monocytes Absolute 0.8  0.1 - 1.0 K/uL   Eosinophils Relative 2  0 - 5 %   Eosinophils Absolute 0.1  0.0 - 0.7 K/uL   Basophils Relative 0  0 - 1 %   Basophils Absolute 0.0  0.0 - 0.1 K/uL  BASIC METABOLIC PANEL      Result Value Range   Sodium 140  135 - 145 mEq/L   Potassium 4.0  3.5 - 5.1 mEq/L   Chloride 101  96 - 112 mEq/L   CO2 28  19 - 32 mEq/L   Glucose, Bld 94  70 - 99 mg/dL   BUN 7  6 - 23 mg/dL   Creatinine, Ser 7.82  0.50 - 1.10 mg/dL   Calcium 95.6  8.4 - 21.3 mg/dL   GFR calc non Af Amer >90  >90 mL/min   GFR calc Af Amer  >90  >90 mL/min  HCG, SERUM, QUALITATIVE      Result Value Range   Preg, Serum NEGATIVE  NEGATIVE  URINE MICROSCOPIC-ADD ON      Result Value Range   Squamous Epithelial / LPF MANY (*) RARE   WBC, UA 3-6  <3 WBC/hpf   RBC /  HPF 0-2  <3 RBC/hpf   Bacteria, UA MANY (*) RARE    Imaging Review No results found.  EKG Interpretation   None       MDM   1. Abdominal pain    Patient with crampy abdominal pain and some nausea for the past one to 2 weeks. Patient is concerned that she is pregnant states last menstrual period was the end of October. Patient also made mention of a slight discharge. No severe abdominal pain no back pain no dysuria.  Workup here without any significant findings no leukocytosis pregnancy test was done as a serum pregnancy test because she doesn't trust urine it was negative. Urinalysis was some contamination but no evidence urinary tract infection. Patient refused a pelvic exam that was offered. Patient will followup with her doctor also given referral to OB/GYN plus she states she has an OB/GYN she can followup with. Patient nontoxic no acute distress abdomen was soft nontender no significant findings on abdominal exam. Clinically can not completely rule out pelvic infection but since patient refuses pelvic examination.   a  Shelda Jakes, MD 04/28/13 [phone]

## 2013-04-30 LAB — URINE CULTURE: Colony Count: 70000

## 2013-05-01 NOTE — ED Notes (Signed)
+   Urine No treatment needed per Ann Moore

## 2013-05-29 HISTORY — PX: DILATION AND CURETTAGE OF UTERUS: SHX78

## 2013-06-04 ENCOUNTER — Emergency Department (HOSPITAL_COMMUNITY)
Admission: EM | Admit: 2013-06-04 | Discharge: 2013-06-04 | Disposition: A | Discharge status: Home / Self Care | Payer: Medicaid Other | Attending: Emergency Medicine | Admitting: Emergency Medicine

## 2013-06-04 ENCOUNTER — Encounter (HOSPITAL_COMMUNITY): Payer: Self-pay | Admitting: Emergency Medicine

## 2013-06-04 DIAGNOSIS — Z79899 Other long term (current) drug therapy: Secondary | ICD-10-CM | POA: Insufficient documentation

## 2013-06-04 DIAGNOSIS — R6889 Other general symptoms and signs: Secondary | ICD-10-CM

## 2013-06-04 DIAGNOSIS — R5383 Other fatigue: Secondary | ICD-10-CM

## 2013-06-04 DIAGNOSIS — B86 Scabies: Secondary | ICD-10-CM

## 2013-06-04 DIAGNOSIS — R5381 Other malaise: Secondary | ICD-10-CM | POA: Insufficient documentation

## 2013-06-04 DIAGNOSIS — IMO0001 Reserved for inherently not codable concepts without codable children: Secondary | ICD-10-CM | POA: Insufficient documentation

## 2013-06-04 DIAGNOSIS — Z3202 Encounter for pregnancy test, result negative: Secondary | ICD-10-CM | POA: Insufficient documentation

## 2013-06-04 DIAGNOSIS — F17200 Nicotine dependence, unspecified, uncomplicated: Secondary | ICD-10-CM | POA: Insufficient documentation

## 2013-06-04 DIAGNOSIS — J111 Influenza due to unidentified influenza virus with other respiratory manifestations: Secondary | ICD-10-CM | POA: Insufficient documentation

## 2013-06-04 DIAGNOSIS — R52 Pain, unspecified: Secondary | ICD-10-CM | POA: Insufficient documentation

## 2013-06-04 LAB — POCT PREGNANCY, URINE: PREG TEST UR: NEGATIVE

## 2013-06-04 MED ORDER — OSELTAMIVIR PHOSPHATE 75 MG PO CAPS: 75.0000 mg | ORAL_CAPSULE | Freq: Two times a day (BID) | ORAL | Status: DC

## 2013-06-04 MED ORDER — PERMETHRIN 5 % EX CREA: TOPICAL_CREAM | CUTANEOUS | Status: DC

## 2013-06-04 NOTE — Discharge Instructions (Signed)
Scabies Scabies are small bugs (mites) that burrow under the skin and cause red bumps and severe itching. These bugs can only be seen with a microscope. Scabies are highly contagious. They can spread easily from person to person by direct contact. They are also spread through sharing clothing or linens that have the scabies mites living in them. It is not unusual for an entire family to become infected through shared towels, clothing, or bedding.  HOME CARE INSTRUCTIONS   Your caregiver may prescribe a cream or lotion to kill the mites. If cream is prescribed, massage the cream into the entire body from the neck to the bottom of both feet. Also massage the cream into the scalp and face if your child is less than 22 year old. Avoid the eyes and mouth. Do not wash your hands after application.  Leave the cream on for 8 to 12 hours. Your child should bathe or shower after the 8 to 12 hour application period. Sometimes it is helpful to apply the cream to your child right before bedtime.  One treatment is usually effective and will eliminate approximately 95% of infestations. For severe cases, your caregiver may decide to repeat the treatment in 1 week. Everyone in your household should be treated with one application of the cream.  New rashes or burrows should not appear within 24 to 48 hours after successful treatment. However, the itching and rash may last for 2 to 4 weeks after successful treatment. Your caregiver may prescribe a medicine to help with the itching or to help the rash go away more quickly.  Scabies can live on clothing or linens for up to 3 days. All of your child's recently used clothing, towels, stuffed toys, and bed linens should be washed in hot water and then dried in a dryer for at least 20 minutes on high heat. Items that cannot be washed should be enclosed in a plastic bag for at least 3 days.  To help relieve itching, bathe your child in a cool bath or apply cool washcloths to the  affected areas.  Your child may return to school after treatment with the prescribed cream. SEEK MEDICAL CARE IF:   The itching persists longer than 4 weeks after treatment.  The rash spreads or becomes infected. Signs of infection include red blisters or yellow-tan crust. Document Released: 05/15/2005 Document Revised: 08/07/2011 Document Reviewed: 09/23/2008 [facility] Patient Information 2014 [place], [place].   Viral Infections A viral infection can be caused by different types of viruses.Most viral infections are not serious and resolve on their own. However, some infections may cause severe symptoms and may lead to further complications. SYMPTOMS Viruses can frequently cause:  Minor sore throat.  Aches and pains.  Headaches.  Runny nose.  Different types of rashes.  Watery eyes.  Tiredness.  Cough.  Loss of appetite.  Gastrointestinal infections, resulting in nausea, vomiting, and diarrhea. These symptoms do not respond to antibiotics because the infection is not caused by bacteria. However, you might catch a bacterial infection following the viral infection. This is sometimes called a "superinfection." Symptoms of such a bacterial infection may include:  Worsening sore throat with pus and difficulty swallowing.  Swollen neck glands.  Chills and a high or persistent fever.  Severe headache.  Tenderness over the sinuses.  Persistent overall ill feeling (malaise), muscle aches, and tiredness (fatigue).  Persistent cough.  Yellow, green, or brown mucus production with coughing. HOME CARE INSTRUCTIONS   Only take over-the-counter or prescription medicines  for pain, discomfort, diarrhea, or fever as directed by your caregiver.  Drink enough water and fluids to keep your urine clear or pale yellow. Sports drinks can provide valuable electrolytes, sugars, and hydration.  Get plenty of rest and maintain proper nutrition. Soups and broths with crackers or rice  are fine. SEEK IMMEDIATE MEDICAL CARE IF:   You have severe headaches, shortness of breath, chest pain, neck pain, or an unusual rash.  You have uncontrolled vomiting, diarrhea, or you are unable to keep down fluids.  You or your child has an oral temperature above 102 F (38.9 C), not controlled by medicine.  Your baby is older than 3 months with a rectal temperature of 102 F (38.9 C) or higher.  Your baby is 473 months old or younger with a rectal temperature of 100.4 F (38 C) or higher. MAKE SURE YOU:   Understand these instructions.  Will watch your condition.  Will get help right away if you are not doing well or get worse. Document Released: 02/22/2005 Document Revised: 08/07/2011 Document Reviewed: 09/19/2010 [facility] Patient Information 2014 [place], [place].

## 2013-06-04 NOTE — ED Provider Notes (Signed)
CSN: [id number]     Arrival date & time 06/04/13  1937 History   First MD Initiated Contact with Patient 06/04/13 2011     Chief Complaint  Patient presents with  . Rash   (Consider location/radiation/quality/duration/timing/severity/associated sxs/prior Treatment) HPI Comments: Ann Moore is a 23 y.o. Female presenting with 2 complaints.  She reports a 2 week history of pruritic rash along her waistline of abdomen and back,  On her wrists and groin creases.  Her son has a similar rash, although his is worse,  And he was just seen at a local urgent care and diagnosed with scabies.  Additionally,  He has flu like symptoms and tested positive for the flu today.  Ann Moore reports have generalized body aches, dry cough,  Nausea starting just today along with fatigue and mild sore throat.  She has felt feverish without measured temperature, denies abdominal pain or diarrhea.  She has had no medicines prior to arrival for these symptoms.  She denies sob, chest pain, headache.     The history is provided by the patient.    History reviewed. No pertinent past medical history. History reviewed. No pertinent past surgical history. History reviewed. No pertinent family history. History  Substance Use Topics  . Smoking status: Current Every Day Smoker -- 0.50 packs/day    Types: Cigarettes  . Smokeless tobacco: Not on file  . Alcohol Use: Yes     Comment: occ   OB History   Grav Para Term Preterm Abortions TAB SAB Ect Mult Living                 Review of Systems  Constitutional: Positive for fever.  HENT: Positive for sore throat. Negative for congestion.   Eyes: Negative.   Respiratory: Positive for cough. Negative for chest tightness, shortness of breath and wheezing.   Cardiovascular: Negative for chest pain.  Gastrointestinal: Negative for nausea and abdominal pain.  Genitourinary: Negative.   Musculoskeletal: Positive for myalgias. Negative for arthralgias, joint swelling and  neck pain.  Skin: Positive for rash. Negative for wound.  Neurological: Negative for dizziness, weakness, light-headedness, numbness and headaches.  Psychiatric/Behavioral: Negative.     Allergies  Hydrocodone  Home Medications   Current Outpatient Rx  Name  Route  Sig  Dispense  Refill  . megestrol (MEGACE) 40 MG tablet   Oral   Take 1 tablet (40 mg total) by mouth daily.   60 tablet   2   . oseltamivir (TAMIFLU) 75 MG capsule   Oral   Take 1 capsule (75 mg total) by mouth every 12 (twelve) hours.   10 capsule   0   . permethrin (ELIMITE) 5 % cream      Apply to affected area once,  Shower off after 10 hours.  May repeat x 1 in 10 days.   60 g   1   . promethazine (PHENERGAN) 25 MG tablet   Oral   Take 1 tablet (25 mg total) by mouth every 6 (six) hours as needed for nausea or vomiting.   12 tablet   0    BP 106/72  Pulse 101  Temp(Src) 98.2 F (36.8 C) (Oral)  Resp 24  Ht 5' 3.5" (1.613 m)  Wt 140 lb (63.504 kg)  BMI 24.41 kg/m2  SpO2 100%  LMP 05/03/2013 Physical Exam  Constitutional: She is oriented to person, place, and time. She appears well-developed and well-nourished.  HENT:  Head: Normocephalic and atraumatic.  Right Ear: Tympanic  membrane and ear canal normal.  Left Ear: Tympanic membrane and ear canal normal.  Nose: No mucosal edema or rhinorrhea.  Mouth/Throat: Uvula is midline and mucous membranes are normal. Posterior oropharyngeal erythema present. No oropharyngeal exudate, posterior oropharyngeal edema or tonsillar abscesses.  Eyes: Conjunctivae are normal.  Neck: Full passive range of motion without pain. Neck supple.  Cardiovascular: Normal rate and normal heart sounds.   Pulmonary/Chest: Effort normal. No respiratory distress. She has no decreased breath sounds. She has no wheezes. She has no rhonchi. She has no rales.  Abdominal: Soft. There is no tenderness.  Musculoskeletal: Normal range of motion.  Lymphadenopathy:    She has no  cervical adenopathy.  Neurological: She is alert and oriented to person, place, and time.  Skin: Skin is warm and dry. Rash noted. Rash is papular.  Papular rash along waistline, wrists, few lesions in fingerweb spaces. No drainage. No surrounding erythema.    Psychiatric: She has a normal mood and affect.    ED Course  Procedures (including critical care time) Labs Review Labs Reviewed  POCT PREGNANCY, URINE   Imaging Review No results found.  EKG Interpretation   None       MDM   1. Flu-like symptoms   2. Scabies    Pt prescribed tamiflu, permethrin.  Encouraged rest,  Fluids, motrin,  F/u with pcp for worsened sx.    Burgess AmorJulie Noreene Boreman, PA-C 06/04/13 2116

## 2013-06-04 NOTE — ED Notes (Signed)
Pt with rash x 2 weeks, c/o itching, benadryl for itching and hydrocortisone cream as well; states son dx with scabies today at Urgent Care

## 2013-06-05 NOTE — ED Provider Notes (Signed)
Medical screening examination/treatment/procedure(s) were performed by non-physician practitioner and as supervising physician I was immediately available for consultation/collaboration.  EKG Interpretation   None       Horace Wishon, MD, FACEP   Derold Dorsch L Goldie Dimmer, MD 06/05/13 0103 

## 2013-06-11 ENCOUNTER — Emergency Department (HOSPITAL_COMMUNITY)
Admission: EM | Admit: 2013-06-11 | Discharge: 2013-06-11 | Disposition: A | Discharge status: Home / Self Care | Payer: Medicaid Other | Attending: Emergency Medicine | Admitting: Emergency Medicine

## 2013-06-11 ENCOUNTER — Encounter (HOSPITAL_COMMUNITY): Payer: Self-pay | Admitting: Emergency Medicine

## 2013-06-11 DIAGNOSIS — F17200 Nicotine dependence, unspecified, uncomplicated: Secondary | ICD-10-CM | POA: Insufficient documentation

## 2013-06-11 DIAGNOSIS — Z79899 Other long term (current) drug therapy: Secondary | ICD-10-CM | POA: Insufficient documentation

## 2013-06-11 DIAGNOSIS — L259 Unspecified contact dermatitis, unspecified cause: Secondary | ICD-10-CM

## 2013-06-11 MED ORDER — PREDNISONE 50 MG PO TABS
60.0000 mg | ORAL_TABLET | Freq: Once | ORAL | Status: AC
  Administered 2013-06-11: 17:00:00 60 mg via ORAL
  Filled 2013-06-11 (×2): qty 1

## 2013-06-11 MED ORDER — PREDNISONE 10 MG PO TABS: ORAL_TABLET | ORAL | Status: DC

## 2013-06-11 MED ORDER — DIPHENHYDRAMINE HCL 25 MG PO CAPS
25.0000 mg | ORAL_CAPSULE | Freq: Once | ORAL | Status: AC
Start: 2013-06-11 — End: 2013-06-11
  Administered 2013-06-11: 25 mg via ORAL
  Filled 2013-06-11: qty 1

## 2013-06-11 NOTE — Discharge Instructions (Signed)
Contact Dermatitis Contact dermatitis is a rash that happens when something touches the skin. You touched something that irritates your skin, or you have allergies to something you touched. HOME CARE   Avoid the thing that caused your rash.  Keep your rash away from hot water, soap, sunlight, chemicals, and other things that might bother it.  Do not scratch your rash.  You can take cool baths to help stop itching.  Only take medicine as told by your doctor.  Keep all doctor visits as told. GET HELP RIGHT AWAY IF:   Your rash is not better after 3 days.  Your rash gets worse.  Your rash is puffy (swollen), tender, red, sore, or warm.  You have problems with your medicine. MAKE SURE YOU:   Understand these instructions.  Will watch your condition.  Will get help right away if you are not doing well or get worse. Document Released: 03/12/2009 Document Revised: 08/07/2011 Document Reviewed: 10/18/2010 [employer] Patient Information 2014 [employer].  

## 2013-06-11 NOTE — ED Notes (Signed)
Pt used cream for scabies yesterday. States woke up with spots all over her today. Pt has small red raised bumps noted to trunk area mostly and to arms.

## 2013-06-13 NOTE — ED Provider Notes (Signed)
CSN: [id number]     Arrival date & time 06/11/13  1524 History   First MD Initiated Contact with Patient 06/11/13 1611     Chief Complaint  Patient presents with  . Rash   (Consider location/radiation/quality/duration/timing/severity/associated sxs/prior Treatment) HPI Comments: Ann Moore is a 23 y.o. female who presents to the Emergency Department complaining of rash.  She states she was seen here recently for itching rash to her trunk.  Was advised the rash may be related to scabies and prescribed a "cream".  She woke up with worsening rash "all over my body" after applying the cream.  She states she has had an allergic reaction to some type of cream in the past as well.  She denies fever, chills, swelling, difficulty swallowing or breathing.  Patient is a 23 y.o. female presenting with rash. The history is provided by the patient.  Rash Associated symptoms: no fever, no headaches, no joint pain, no shortness of breath, no sore throat and not wheezing     History reviewed. No pertinent past medical history. History reviewed. No pertinent past surgical history. History reviewed. No pertinent family history. History  Substance Use Topics  . Smoking status: Current Every Day Smoker -- 0.50 packs/day    Types: Cigarettes  . Smokeless tobacco: Not on file  . Alcohol Use: Yes     Comment: occ   OB History   Grav Para Term Preterm Abortions TAB SAB Ect Mult Living                 Review of Systems  Constitutional: Negative for fever, chills, activity change and appetite change.  HENT: Negative for facial swelling, sore throat and trouble swallowing.   Respiratory: Negative for chest tightness, shortness of breath and wheezing.   Genitourinary: Negative for dysuria.  Musculoskeletal: Negative for arthralgias, neck pain and neck stiffness.  Skin: Positive for rash. Negative for wound.  Neurological: Negative for dizziness, weakness, numbness and headaches.  Hematological:  Negative for adenopathy.  All other systems reviewed and are negative.    Allergies  Hydrocodone  Home Medications   Current Outpatient Rx  Name  Route  Sig  Dispense  Refill  . ibuprofen (ADVIL,MOTRIN) 200 MG tablet   Oral   Take 800 mg by mouth every 8 (eight) hours as needed. pain         . megestrol (MEGACE) 40 MG tablet   Oral   Take 1 tablet (40 mg total) by mouth daily.   60 tablet   2   . predniSONE (DELTASONE) 10 MG tablet      Take 6 tablets day one, 5 tablets day two, 4 tablets day three, 3 tablets day four, 2 tablets day five, then 1 tablet day six   21 tablet   0    BP 130/86  Pulse 98  Temp(Src) 98.2 F (36.8 C) (Oral)  Resp 18  SpO2 98%  LMP 05/03/2013 Physical Exam  Nursing note and vitals reviewed. Constitutional: She is oriented to person, place, and time. She appears well-developed and well-nourished. No distress.  HENT:  Head: Normocephalic and atraumatic.  Mouth/Throat: Oropharynx is clear and moist.  Neck: Normal range of motion. Neck supple.  Cardiovascular: Normal rate, regular rhythm, normal heart sounds and intact distal pulses.   No murmur heard. Pulmonary/Chest: Effort normal and breath sounds normal. No respiratory distress.  Musculoskeletal: She exhibits no edema and no tenderness.  Lymphadenopathy:    She has no cervical adenopathy.  Neurological: She  is alert and oriented to person, place, and time. She exhibits normal muscle tone. Coordination normal.  Skin: Skin is warm. Rash noted. There is erythema.  Erythematous papules to the trunk and bilateral UE's.  No pustules, vesicles, edema or petechia.  Hands and feet are spared.    ED Course  Procedures (including critical care time) Labs Review Labs Reviewed - No data to display Imaging Review No results found.  EKG Interpretation   None       MDM   1. Contact dermatitis    Previous ed chart reviewed.  Rash is non-toxic appearing.  Airway patent, no edema.  Will  treat with benadryl and prednisone taper.  Patient appears stable for discharge. dermatology referral given.    Genea Rheaume L. Trisha Mangle, PA-C 06/13/13 1335

## 2013-06-14 NOTE — ED Provider Notes (Signed)
Medical screening examination/treatment/procedure(s) were performed by non-physician practitioner and as supervising physician I was immediately available for consultation/collaboration.  EKG Interpretation   None        Jaydi Bray, MD 06/14/13 1549 

## 2013-07-01 ENCOUNTER — Emergency Department (HOSPITAL_COMMUNITY)
Admission: EM | Admit: 2013-07-01 | Discharge: 2013-07-01 | Disposition: A | Discharge status: Home / Self Care | Payer: Medicaid Other | Attending: Emergency Medicine | Admitting: Emergency Medicine

## 2013-07-01 DIAGNOSIS — F17200 Nicotine dependence, unspecified, uncomplicated: Secondary | ICD-10-CM | POA: Insufficient documentation

## 2013-07-01 DIAGNOSIS — Z79899 Other long term (current) drug therapy: Secondary | ICD-10-CM | POA: Insufficient documentation

## 2013-07-01 DIAGNOSIS — N898 Other specified noninflammatory disorders of vagina: Secondary | ICD-10-CM | POA: Insufficient documentation

## 2013-07-01 DIAGNOSIS — Z3202 Encounter for pregnancy test, result negative: Secondary | ICD-10-CM | POA: Insufficient documentation

## 2013-07-01 DIAGNOSIS — N390 Urinary tract infection, site not specified: Secondary | ICD-10-CM | POA: Insufficient documentation

## 2013-07-01 LAB — URINALYSIS, ROUTINE W REFLEX MICROSCOPIC
Bilirubin Urine: NEGATIVE
Glucose, UA: NEGATIVE mg/dL
Hgb urine dipstick: NEGATIVE
KETONES UR: NEGATIVE mg/dL
NITRITE: NEGATIVE
PH: 5.5 (ref 5.0–8.0)
Protein, ur: 30 mg/dL — AB
Specific Gravity, Urine: >1.03 — ABNORMAL HIGH (ref 1.005–1.030)
Urobilinogen, UA: 0.2 mg/dL (ref 0.0–1.0)

## 2013-07-01 LAB — POCT PREGNANCY, URINE: PREG TEST UR: NEGATIVE

## 2013-07-01 LAB — URINE MICROSCOPIC-ADD ON

## 2013-07-01 MED ORDER — CEPHALEXIN 500 MG PO CAPS
500.0000 mg | ORAL_CAPSULE | Freq: Once | ORAL | Status: AC
  Administered 2013-07-01: 500 mg via ORAL
  Filled 2013-07-01: qty 1

## 2013-07-01 MED ORDER — CEPHALEXIN 500 MG PO CAPS: 500.0000 mg | ORAL_CAPSULE | Freq: Four times a day (QID) | ORAL | Status: DC

## 2013-07-01 MED ORDER — PHENAZOPYRIDINE HCL 200 MG PO TABS: 200.0000 mg | ORAL_TABLET | Freq: Three times a day (TID) | ORAL | Status: DC | PRN

## 2013-07-01 NOTE — Discharge Instructions (Signed)
Urinary Tract Infection  Urinary tract infections (UTIs) can develop anywhere along your urinary tract. Your urinary tract is your body's drainage system for removing wastes and extra water. Your urinary tract includes two kidneys, two ureters, a bladder, and a urethra. Your kidneys are a pair of bean-shaped organs. Each kidney is about the size of your fist. They are located below your ribs, one on each side of your spine.  CAUSES  Infections are caused by microbes, which are microscopic organisms, including fungi, viruses, and bacteria. These organisms are so small that they can only be seen through a microscope. Bacteria are the microbes that most commonly cause UTIs.  SYMPTOMS   Symptoms of UTIs may vary by age and gender of the patient and by the location of the infection. Symptoms in young women typically include a frequent and intense urge to urinate and a painful, burning feeling in the bladder or urethra during urination. Older women and men are more likely to be tired, shaky, and weak and have muscle aches and abdominal pain. A fever may mean the infection is in your kidneys. Other symptoms of a kidney infection include pain in your back or sides below the ribs, nausea, and vomiting.  DIAGNOSIS  To diagnose a UTI, your caregiver will ask you about your symptoms. Your caregiver also will ask to provide a urine sample. The urine sample will be tested for bacteria and white blood cells. White blood cells are made by your body to help fight infection.  TREATMENT   Typically, UTIs can be treated with medication. Because most UTIs are caused by a bacterial infection, they usually can be treated with the use of antibiotics. The choice of antibiotic and length of treatment depend on your symptoms and the type of bacteria causing your infection.  HOME CARE INSTRUCTIONS   If you were prescribed antibiotics, take them exactly as your caregiver instructs you. Finish the medication even if you feel better after you  have only taken some of the medication.   Drink enough water and fluids to keep your urine clear or pale yellow.   Avoid caffeine, tea, and carbonated beverages. They tend to irritate your bladder.   Empty your bladder often. Avoid holding urine for long periods of time.   Empty your bladder before and after sexual intercourse.   After a bowel movement, women should cleanse from front to back. Use each tissue only once.  SEEK MEDICAL CARE IF:    You have back pain.   You develop a fever.   Your symptoms do not begin to resolve within 3 days.  SEEK IMMEDIATE MEDICAL CARE IF:    You have severe back pain or lower abdominal pain.   You develop chills.   You have nausea or vomiting.   You have continued burning or discomfort with urination.  MAKE SURE YOU:    Understand these instructions.   Will watch your condition.   Will get help right away if you are not doing well or get worse.  Document Released: 02/22/2005 Document Revised: 11/14/2011 Document Reviewed: 06/23/2011  [employer] Patient Information 2014 [employer].

## 2013-07-01 NOTE — ED Provider Notes (Signed)
CSN: [id number]     Arrival date & time 07/01/13  2002 History  This chart was scribed for Ann Creasehristopher J. Turhan Chill, MD by Ardelia Memsylan Malpass, ED Scribe. This patient was seen in room APA10/APA10 and the patient's care was started at 9:19 PM.    Chief Complaint  Patient presents with  . Abdominal Pain  . uti symptoms     The history is provided by the patient. No language interpreter was used.    HPI Comments: Ann Moore is a 23 y.o. female with a history of a prior UTI who presents to the Emergency Department complaining of intermittent, "stabbing" suprapubic abdominal pain, and "dull" epigastric abdominal pain over the past week that worsened today. She reports associated nausea today, but denies any vomiting. She also reports that she has been having dysuria, urinary frequency and mild vaginal discharge over the past couple days. She states that this feels similar to a UTI she had in the past. She denies dyspnea, vaginal bleeding or any other symptoms.  No past medical history on file. No past surgical history on file. No family history on file. History  Substance Use Topics  . Smoking status: Current Every Day Smoker -- 0.50 packs/day    Types: Cigarettes  . Smokeless tobacco: Not on file  . Alcohol Use: Yes     Comment: occ   OB History   Grav Para Term Preterm Abortions TAB SAB Ect Mult Living                 Review of Systems  Respiratory: Negative for shortness of breath.   Gastrointestinal: Positive for nausea and abdominal pain. Negative for vomiting.  Genitourinary: Positive for dysuria, frequency and vaginal discharge. Negative for vaginal bleeding.  All other systems reviewed and are negative.   Allergies  Hydrocodone  Home Medications   Current Outpatient Rx  Name  Route  Sig  Dispense  Refill  . ibuprofen (ADVIL,MOTRIN) 200 MG tablet   Oral   Take 800 mg by mouth every 8 (eight) hours as needed. pain         . megestrol (MEGACE) 40 MG tablet   Oral  Take 1 tablet (40 mg total) by mouth daily.   60 tablet   2   . predniSONE (DELTASONE) 10 MG tablet      Take 6 tablets day one, 5 tablets day two, 4 tablets day three, 3 tablets day four, 2 tablets day five, then 1 tablet day six   21 tablet   0    Triage Vitals: Ht 5' 3.5" (1.613 m)  Wt 148 lb 7 oz (67.331 kg)  BMI 25.88 kg/m2  LMP 05/07/2013  Physical Exam  Constitutional: She is oriented to person, place, and time. She appears well-developed and well-nourished. No distress.  HENT:  Head: Normocephalic and atraumatic.  Right Ear: Hearing normal.  Left Ear: Hearing normal.  Nose: Nose normal.  Mouth/Throat: Oropharynx is clear and moist and mucous membranes are normal.  Eyes: Conjunctivae and EOM are normal. Pupils are equal, round, and reactive to light.  Neck: Normal range of motion. Neck supple.  Cardiovascular: Regular rhythm, S1 normal and S2 normal.  Exam reveals no gallop and no friction rub.   No murmur heard. Pulmonary/Chest: Effort normal and breath sounds normal. No respiratory distress. She exhibits no tenderness.  Abdominal: Soft. Normal appearance and bowel sounds are normal. There is no hepatosplenomegaly. There is tenderness (suprapubic and epigastric). There is no rebound, no guarding, no tenderness  at McBurney's point and negative Murphy's sign. No hernia.  Musculoskeletal: Normal range of motion.  Neurological: She is alert and oriented to person, place, and time. She has normal strength. No cranial nerve deficit or sensory deficit. Coordination normal. GCS eye subscore is 4. GCS verbal subscore is 5. GCS motor subscore is 6.  Skin: Skin is warm, dry and intact. No rash noted. No cyanosis.  Psychiatric: She has a normal mood and affect. Her speech is normal and behavior is normal. Thought content normal.    ED Course  Procedures (including critical care time)  DIAGNOSTIC STUDIES: COORDINATION OF CARE: 9:26 PM- Discussed plan to obtain a pregnancy test and  UA. Pt advised of plan for treatment and pt agrees.  Labs Review Labs Reviewed  URINALYSIS, ROUTINE W REFLEX MICROSCOPIC   Imaging Review No results found.  EKG Interpretation   None       MDM   1. UTI (lower urinary tract infection)    She presents to the ER with complaints of urinary frequency, dysuria and lower abdominal cramping with pain into her lower back. She reports that she has had similar symptoms in the past with UTI. Her abdominal exam was benign. Urinalysis does show signs of infection. Patient will be treated with Keflex, Pyridium.   I personally performed the services described in this documentation, which was scribed in my presence. The recorded information has been reviewed and is accurate.     Ann Crease, MD 07/01/13 2240

## 2013-07-01 NOTE — ED Notes (Signed)
Intermittent lower abd pain for a week, also burning with urination

## 2013-07-04 LAB — URINE CULTURE

## 2013-07-05 ENCOUNTER — Telehealth (HOSPITAL_COMMUNITY): Payer: Self-pay | Admitting: Emergency Medicine

## 2013-07-05 NOTE — ED Notes (Signed)
Post ED Visit - Positive Culture Follow-up  Culture report reviewed by antimicrobial stewardship pharmacist: []  Wes Dulaney, Pharm.D., BCPS [x]  Celedonio MiyamotoJeremy Frens, Pharm.D., BCPS []  Georgina PillionElizabeth Martin, Pharm.D., BCPS []  AdenaMinh Pham, [address] BoulevardPharm.D., BCPS, AAHIVP []  Estella HuskMichelle Turner, Pharm.D., BCPS, AAHIVP  Positive urine culture Treated with Keflex, organism sensitive to the same and no further patient follow-up is required at this time.  Marcelle OverlieHolland, Jenel LucksKylie 07/05/2013, 9:26 AM

## 2013-09-15 ENCOUNTER — Other Ambulatory Visit: Payer: Self-pay | Admitting: Adult Health

## 2013-09-23 ENCOUNTER — Encounter (HOSPITAL_COMMUNITY): Payer: Self-pay | Admitting: Emergency Medicine

## 2013-09-23 ENCOUNTER — Emergency Department (HOSPITAL_COMMUNITY)
Admission: EM | Admit: 2013-09-23 | Discharge: 2013-09-23 | Disposition: A | Discharge status: Home / Self Care | Payer: Medicaid Other | Attending: Emergency Medicine | Admitting: Emergency Medicine

## 2013-09-23 DIAGNOSIS — F17200 Nicotine dependence, unspecified, uncomplicated: Secondary | ICD-10-CM | POA: Insufficient documentation

## 2013-09-23 DIAGNOSIS — Z3202 Encounter for pregnancy test, result negative: Secondary | ICD-10-CM | POA: Insufficient documentation

## 2013-09-23 DIAGNOSIS — R109 Unspecified abdominal pain: Secondary | ICD-10-CM | POA: Insufficient documentation

## 2013-09-23 DIAGNOSIS — R102 Pelvic and perineal pain: Secondary | ICD-10-CM

## 2013-09-23 LAB — BASIC METABOLIC PANEL WITH GFR
BUN: 5 mg/dL — ABNORMAL LOW (ref 6–23)
CO2: 28 meq/L (ref 19–32)
Calcium: 9.7 mg/dL (ref 8.4–10.5)
Chloride: 101 meq/L (ref 96–112)
Creatinine, Ser: 0.7 mg/dL (ref 0.50–1.10)
GFR calc Af Amer: >90 mL/min
GFR calc non Af Amer: >90 mL/min
Glucose, Bld: 93 mg/dL (ref 70–99)
Potassium: 4.1 meq/L (ref 3.7–5.3)
Sodium: 141 meq/L (ref 137–147)

## 2013-09-23 LAB — URINALYSIS, ROUTINE W REFLEX MICROSCOPIC
BILIRUBIN URINE: NEGATIVE
Glucose, UA: NEGATIVE mg/dL
Hgb urine dipstick: NEGATIVE
Ketones, ur: NEGATIVE mg/dL
LEUKOCYTES UA: NEGATIVE
NITRITE: NEGATIVE
PH: 6 (ref 5.0–8.0)
Protein, ur: NEGATIVE mg/dL
SPECIFIC GRAVITY, URINE: 1.025 (ref 1.005–1.030)
UROBILINOGEN UA: 0.2 mg/dL (ref 0.0–1.0)

## 2013-09-23 LAB — WET PREP, GENITAL
Clue Cells Wet Prep HPF POC: NONE SEEN
TRICH WET PREP: NONE SEEN
WBC, Wet Prep HPF POC: NONE SEEN
Yeast Wet Prep HPF POC: NONE SEEN

## 2013-09-23 LAB — CBC WITH DIFFERENTIAL/PLATELET
Basophils Absolute: 0 K/uL (ref 0.0–0.1)
Basophils Relative: 0 % (ref 0–1)
Eosinophils Absolute: 0.1 K/uL (ref 0.0–0.7)
Eosinophils Relative: 1 % (ref 0–5)
HCT: 40.6 % (ref 36.0–46.0)
Hemoglobin: 13.8 g/dL (ref 12.0–15.0)
Lymphocytes Relative: 37 % (ref 12–46)
Lymphs Abs: 2.4 K/uL (ref 0.7–4.0)
MCH: 29.9 pg (ref 26.0–34.0)
MCHC: 34 g/dL (ref 30.0–36.0)
MCV: 88.1 fL (ref 78.0–100.0)
Monocytes Absolute: 0.5 K/uL (ref 0.1–1.0)
Monocytes Relative: 8 % (ref 3–12)
Neutro Abs: 3.5 K/uL (ref 1.7–7.7)
Neutrophils Relative %: 54 % (ref 43–77)
Platelets: 203 K/uL (ref 150–400)
RBC: 4.61 MIL/uL (ref 3.87–5.11)
RDW: 12.9 % (ref 11.5–15.5)
WBC: 6.5 K/uL (ref 4.0–10.5)

## 2013-09-23 LAB — POC URINE PREG, ED: Preg Test, Ur: NEGATIVE

## 2013-09-23 MED ORDER — TRAMADOL HCL 50 MG PO TABS: 50.0000 mg | ORAL_TABLET | Freq: Four times a day (QID) | ORAL | Status: DC | PRN

## 2013-09-23 NOTE — ED Provider Notes (Signed)
CSN: [id number]     Arrival date & time 09/23/13  1708 History   First MD Initiated Contact with Patient 09/23/13 1928     Chief Complaint  Patient presents with  . Abdominal Pain     (Consider location/radiation/quality/duration/timing/severity/associated sxs/prior Treatment) HPI Comments: Patient is a 23 year old female who presents with complaints of suprapubic abdominal pain which has been worsening over the past week. She denies any dysuria, vaginal discharge, vaginal bleeding, bowel complaints. She states she is sexually active with a new partner for the past 3 months.  Patient is a 23 y.o. female presenting with abdominal pain. The history is provided by the patient.  Abdominal Pain Pain location:  Suprapubic Pain quality: cramping   Pain radiates to:  Does not radiate Pain severity:  Moderate Onset quality:  Gradual Duration:  1 week Timing:  Constant Progression:  Worsening Chronicity:  New Relieved by:  Nothing Worsened by:  Nothing tried Ineffective treatments:  None tried Associated symptoms: no chills and no fever     History reviewed. No pertinent past medical history. History reviewed. No pertinent past surgical history. History reviewed. No pertinent family history. History  Substance Use Topics  . Smoking status: Current Every Day Smoker -- 0.50 packs/day    Types: Cigarettes  . Smokeless tobacco: Not on file  . Alcohol Use: Yes     Comment: occ   OB History   Grav Para Term Preterm Abortions TAB SAB Ect Mult Living                 Review of Systems  Constitutional: Negative for fever and chills.  Gastrointestinal: Positive for abdominal pain.  All other systems reviewed and are negative.     Allergies  Hydrocodone  Home Medications   Prior to Admission medications   Medication Sig Start Date End Date Taking? Authorizing Provider  ibuprofen (ADVIL,MOTRIN) 200 MG tablet Take 800 mg by mouth every 8 (eight) hours as needed. pain   Yes  Historical Provider, MD   BP 127/80  Pulse 91  Temp(Src) 98.6 F (37 C) (Oral)  Resp 18  Ht 5\' 3"  (1.6 m)  Wt 147 lb (66.679 kg)  BMI 26.05 kg/m2  SpO2 100%  LMP 09/12/2013 Physical Exam  Nursing note and vitals reviewed. Constitutional: She is oriented to person, place, and time. She appears well-developed and well-nourished. No distress.  HENT:  Head: Normocephalic and atraumatic.  Neck: Normal range of motion. Neck supple.  Cardiovascular: Normal rate and regular rhythm.  Exam reveals no gallop and no friction rub.   No murmur heard. Pulmonary/Chest: Effort normal and breath sounds normal. No respiratory distress. She has no wheezes.  Abdominal: Soft. Bowel sounds are normal. She exhibits no distension. There is no tenderness.  Genitourinary: Vagina normal and uterus normal. No vaginal discharge found.  There are no adnexal masses and no cervical motion tenderness.  Musculoskeletal: Normal range of motion.  Neurological: She is alert and oriented to person, place, and time.  Skin: Skin is warm and dry. She is not diaphoretic.    ED Course  Procedures (including critical care time) Labs Review Labs Reviewed  BASIC METABOLIC PANEL - Abnormal; Notable for the following:    BUN 5 (*)    All other components within normal limits  WET PREP, GENITAL  GC/CHLAMYDIA PROBE AMP  CBC WITH DIFFERENTIAL  URINALYSIS, ROUTINE W REFLEX MICROSCOPIC  HIV ANTIBODY (ROUTINE TESTING)  POC URINE PREG, ED    Imaging Review No results found.  EKG Interpretation None      MDM   Final diagnoses:  None    Patient is a 23 year old female who presents with complaints of suprapubic abdominal pain that has been worsening for the past week. She is tender to palpation in the suprapubic region with no rebound and no guarding. Pelvic examination reveals no acute abnormalities and wet prep is unremarkable. Urinalysis is clean and pregnancy test is negative. She has no white count and  laboratory studies are otherwise unremarkable. At this point her workup is completely normal and I have been unable to identify the exact cause of her discomfort. She could possibly have an ovarian cyst. I feel as though she is appropriate for discharge, but will bring her back tomorrow for an ultrasound when the staff is here to perform this.    Geoffery Lyonsouglas Niva Murren, MD 09/23/13 2059

## 2013-09-23 NOTE — Discharge Instructions (Signed)
Tramadol as prescribed as needed for pain.  Return tomorrow for an ultrasound.   Abdominal Pain, Women Abdominal (stomach, pelvic, or belly) pain can be caused by many things. It is important to tell your doctor:  The location of the pain.  Does it come and go or is it present all the time?  Are there things that start the pain (eating certain foods, exercise)?  Are there other symptoms associated with the pain (fever, nausea, vomiting, diarrhea)? All of this is helpful to know when trying to find the cause of the pain. CAUSES   Stomach: virus or bacteria infection, or ulcer.  Intestine: appendicitis (inflamed appendix), regional ileitis (Crohn's disease), ulcerative colitis (inflamed colon), irritable bowel syndrome, diverticulitis (inflamed diverticulum of the colon), or cancer of the stomach or intestine.  Gallbladder disease or stones in the gallbladder.  Kidney disease, kidney stones, or infection.  Pancreas infection or cancer.  Fibromyalgia (pain disorder).  Diseases of the female organs:  Uterus: fibroid (non-cancerous) tumors or infection.  Fallopian tubes: infection or tubal pregnancy.  Ovary: cysts or tumors.  Pelvic adhesions (scar tissue).  Endometriosis (uterus lining tissue growing in the pelvis and on the pelvic organs).  Pelvic congestion syndrome (female organs filling up with blood just before the menstrual period).  Pain with the menstrual period.  Pain with ovulation (producing an egg).  Pain with an IUD (intrauterine device, birth control) in the uterus.  Cancer of the female organs.  Functional pain (pain not caused by a disease, may improve without treatment).  Psychological pain.  Depression. DIAGNOSIS  Your doctor will decide the seriousness of your pain by doing an examination.  Blood tests.  X-rays.  Ultrasound.  CT scan (computed tomography, special type of X-ray).  MRI (magnetic resonance imaging).  Cultures, for  infection.  Barium enema (dye inserted in the large intestine, to better view it with X-rays).  Colonoscopy (looking in intestine with a lighted tube).  Laparoscopy (minor surgery, looking in abdomen with a lighted tube).  Major abdominal exploratory surgery (looking in abdomen with a large incision). TREATMENT  The treatment will depend on the cause of the pain.   Many cases can be observed and treated at home.  Over-the-counter medicines recommended by your caregiver.  Prescription medicine.  Antibiotics, for infection.  Birth control pills, for painful periods or for ovulation pain.  Hormone treatment, for endometriosis.  Nerve blocking injections.  Physical therapy.  Antidepressants.  Counseling with a psychologist or psychiatrist.  Minor or major surgery. HOME CARE INSTRUCTIONS   Do not take laxatives, unless directed by your caregiver.  Take over-the-counter pain medicine only if ordered by your caregiver. Do not take aspirin because it can cause an upset stomach or bleeding.  Try a clear liquid diet (broth or water) as ordered by your caregiver. Slowly move to a bland diet, as tolerated, if the pain is related to the stomach or intestine.  Have a thermometer and take your temperature several times a day, and record it.  Bed rest and sleep, if it helps the pain.  Avoid sexual intercourse, if it causes pain.  Avoid stressful situations.  Keep your follow-up appointments and tests, as your caregiver orders.  If the pain does not go away with medicine or surgery, you may try:  Acupuncture.  Relaxation exercises (yoga, meditation).  Group therapy.  Counseling. SEEK MEDICAL CARE IF:   You notice certain foods cause stomach pain.  Your home care treatment is not helping your pain.  You  need stronger pain medicine.  You want your IUD removed.  You feel faint or lightheaded.  You develop nausea and vomiting.  You develop a rash.  You are  having side effects or an allergy to your medicine. SEEK IMMEDIATE MEDICAL CARE IF:   Your pain does not go away or gets worse.  You have a fever.  Your pain is felt only in portions of the abdomen. The right side could possibly be appendicitis. The left lower portion of the abdomen could be colitis or diverticulitis.  You are passing blood in your stools (bright red or black tarry stools, with or without vomiting).  You have blood in your urine.  You develop chills, with or without a fever.  You pass out. MAKE SURE YOU:   Understand these instructions.  Will watch your condition.  Will get help right away if you are not doing well or get worse. Document Released: 03/12/2007 Document Revised: 08/07/2011 Document Reviewed: 04/01/2009 [facility] Patient Information 2014 [place], [place].

## 2013-09-23 NOTE — ED Notes (Signed)
abd pain for 1 week, N/v

## 2013-09-23 NOTE — ED Notes (Signed)
Discharge instructions and prescription given and reviewed with patient.  Patient verbalized understanding to take medication as directed and possible sedating effects.  Patient instructed to return tomorrow for outpatient ultrasound.  Patient ambulatory; discharged home in good condition.

## 2013-09-24 ENCOUNTER — Ambulatory Visit (HOSPITAL_COMMUNITY)
Admit: 2013-09-24 | Discharge: 2013-09-24 | Disposition: A | Discharge status: Home / Self Care | Payer: Medicaid Other | Source: Ambulatory Visit | Attending: Emergency Medicine | Admitting: Emergency Medicine

## 2013-09-24 ENCOUNTER — Other Ambulatory Visit (HOSPITAL_COMMUNITY): Payer: Self-pay | Admitting: Emergency Medicine

## 2013-09-24 DIAGNOSIS — R102 Pelvic and perineal pain: Secondary | ICD-10-CM

## 2013-09-24 DIAGNOSIS — N949 Unspecified condition associated with female genital organs and menstrual cycle: Secondary | ICD-10-CM | POA: Insufficient documentation

## 2013-09-24 LAB — HIV ANTIBODY (ROUTINE TESTING W REFLEX): HIV: NONREACTIVE

## 2013-09-24 NOTE — ED Provider Notes (Signed)
5:11 PM Patient aware of US results. She is in no distress, will f/u w PMD.   Gerhard Munchobert Itay Mella, MD 09/24/13 [phone]

## 2013-09-25 LAB — GC/CHLAMYDIA PROBE AMP
CT Probe RNA: POSITIVE — AB
GC PROBE AMP APTIMA: NEGATIVE

## 2013-09-26 ENCOUNTER — Telehealth (HOSPITAL_BASED_OUTPATIENT_CLINIC_OR_DEPARTMENT_OTHER): Payer: Self-pay | Admitting: Emergency Medicine

## 2013-09-26 NOTE — Telephone Encounter (Signed)
+  Chlamydia. Chart sent to EDP office for review. DHHS attached. 

## 2013-10-08 NOTE — Telephone Encounter (Signed)
Rx for Zithromax 1000 mg PO once, prescribed by Fayrene HelperBowie Tran PA-C, called [facility] to [facility] [phone]) by Norm ParcelShannon Gammons PFM.

## 2014-02-23 ENCOUNTER — Other Ambulatory Visit: Payer: Self-pay | Admitting: Obstetrics and Gynecology

## 2014-02-23 DIAGNOSIS — O3680X0 Pregnancy with inconclusive fetal viability, not applicable or unspecified: Secondary | ICD-10-CM

## 2014-02-24 ENCOUNTER — Ambulatory Visit (INDEPENDENT_AMBULATORY_CARE_PROVIDER_SITE_OTHER): Payer: Medicaid Other

## 2014-02-24 ENCOUNTER — Other Ambulatory Visit: Payer: Self-pay | Admitting: Adult Health

## 2014-02-24 DIAGNOSIS — O3680X0 Pregnancy with inconclusive fetal viability, not applicable or unspecified: Secondary | ICD-10-CM

## 2014-02-24 MED ORDER — DOXYLAMINE-PYRIDOXINE 10-10 MG PO TBEC: DELAYED_RELEASE_TABLET | ORAL | Status: DC

## 2014-02-24 NOTE — Progress Notes (Signed)
U/S-single IUP with +FCA noted, FHR-176 bpm, CRL c/w 8+0wks EDD 10/06/2014, cx appears closed, bilateral adnexa appears WNL

## 2014-03-09 ENCOUNTER — Other Ambulatory Visit (HOSPITAL_COMMUNITY)
Admission: RE | Admit: 2014-03-09 | Discharge: 2014-03-09 | Disposition: A | Discharge status: Home / Self Care | Payer: Medicaid Other | Source: Ambulatory Visit | Attending: Obstetrics & Gynecology | Admitting: Obstetrics & Gynecology

## 2014-03-09 ENCOUNTER — Encounter: Payer: Self-pay | Admitting: Women's Health

## 2014-03-09 ENCOUNTER — Ambulatory Visit (INDEPENDENT_AMBULATORY_CARE_PROVIDER_SITE_OTHER): Payer: Medicaid Other | Admitting: Women's Health

## 2014-03-09 VITALS — BP 130/74 | Wt 147.0 lb

## 2014-03-09 DIAGNOSIS — Z0184 Encounter for antibody response examination: Secondary | ICD-10-CM

## 2014-03-09 DIAGNOSIS — Z3401 Encounter for supervision of normal first pregnancy, first trimester: Secondary | ICD-10-CM

## 2014-03-09 DIAGNOSIS — Z1159 Encounter for screening for other viral diseases: Secondary | ICD-10-CM

## 2014-03-09 DIAGNOSIS — Z01419 Encounter for gynecological examination (general) (routine) without abnormal findings: Secondary | ICD-10-CM | POA: Diagnosis present

## 2014-03-09 DIAGNOSIS — N898 Other specified noninflammatory disorders of vagina: Secondary | ICD-10-CM

## 2014-03-09 DIAGNOSIS — Z331 Pregnant state, incidental: Secondary | ICD-10-CM

## 2014-03-09 DIAGNOSIS — Z113 Encounter for screening for infections with a predominantly sexual mode of transmission: Secondary | ICD-10-CM | POA: Diagnosis present

## 2014-03-09 DIAGNOSIS — Z0283 Encounter for blood-alcohol and blood-drug test: Secondary | ICD-10-CM

## 2014-03-09 DIAGNOSIS — Z114 Encounter for screening for human immunodeficiency virus [HIV]: Secondary | ICD-10-CM

## 2014-03-09 DIAGNOSIS — O2331 Infections of other parts of urinary tract in pregnancy, first trimester: Secondary | ICD-10-CM

## 2014-03-09 DIAGNOSIS — O210 Mild hyperemesis gravidarum: Secondary | ICD-10-CM

## 2014-03-09 DIAGNOSIS — Z3682 Encounter for antenatal screening for nuchal translucency: Secondary | ICD-10-CM

## 2014-03-09 DIAGNOSIS — Z3490 Encounter for supervision of normal pregnancy, unspecified, unspecified trimester: Secondary | ICD-10-CM | POA: Insufficient documentation

## 2014-03-09 DIAGNOSIS — Z3491 Encounter for supervision of normal pregnancy, unspecified, first trimester: Secondary | ICD-10-CM

## 2014-03-09 DIAGNOSIS — Z124 Encounter for screening for malignant neoplasm of cervix: Secondary | ICD-10-CM

## 2014-03-09 DIAGNOSIS — Z1371 Encounter for nonprocreative screening for genetic disease carrier status: Secondary | ICD-10-CM

## 2014-03-09 DIAGNOSIS — Z118 Encounter for screening for other infectious and parasitic diseases: Secondary | ICD-10-CM

## 2014-03-09 DIAGNOSIS — Z1383 Encounter for screening for respiratory disorder NEC: Secondary | ICD-10-CM

## 2014-03-09 LAB — CBC
HCT: 40 % (ref 36.0–46.0)
Hemoglobin: 13.9 g/dL (ref 12.0–15.0)
MCH: 29.9 pg (ref 26.0–34.0)
MCHC: 34.8 g/dL (ref 30.0–36.0)
MCV: 86 fL (ref 78.0–100.0)
Platelets: 212 10*3/uL (ref 150–400)
RBC: 4.65 MIL/uL (ref 3.87–5.11)
RDW: 13.7 % (ref 11.5–15.5)
WBC: 8.7 10*3/uL (ref 4.0–10.5)

## 2014-03-09 LAB — POCT WET PREP (WET MOUNT): CLUE CELLS WET PREP WHIFF POC: POSITIVE

## 2014-03-09 MED ORDER — CONCEPT DHA 53.5-38-1 MG PO CAPS: 1.0000 | ORAL_CAPSULE | Freq: Every day | ORAL | Status: DC

## 2014-03-09 NOTE — Progress Notes (Addendum)
Subjective:  Ann Moore is a 23 y.o. 472P1001 Caucasian female at 6942w6d by 8wk u/s, being seen today for her first obstetrical visit.  Her obstetrical history is significant for term uncomplicated SVB in 2010, previous smoker- quit w/ +PT.  Pregnancy history fully reviewed.  Thinks she may have some anxiety- never been dx, had panic attacks last pregnancy, and some now- are infrequent- resolved w/ breathing techniques. Denies depression, SI/HI. Will continue to observe- if worsens- she is to let us know.   Patient reports nausea getting better on diclegis. Also w/ some vag d/c and odor, no itching/irritation. Had recent UTI- has finished antbx. Unable to void yet today- will try before she leaves. Denies vb, cramping, uti s/s.   BP 130/74  Wt 147 lb (66.679 kg)  LMP 09/12/2013  HISTORY: OB History  Gravida Para Term Preterm AB SAB TAB Ectopic Multiple Living  2 1 1  0 0 0 0 0 0 1    # Outcome Date GA Lbr Len/2nd Weight Sex Delivery Anes PTL Lv  2 CUR           1 TRM 04/04/09 9048w5d 17:00 7 lb 2 oz (3.232 kg) M SVD EPI N Y     Past Medical History  Diagnosis Date  . Medical history non-contributory    Past Surgical History  Procedure Laterality Date  . No past surgeries     Family History  Problem Relation Age of Onset  . Hypertension Mother   . Diabetes Sister   . Diabetes Maternal Grandfather   . Cancer Maternal Grandfather     liver, lung    Exam   System:     General: Well developed & nourished, no acute distress   Skin: Warm & dry, normal coloration and turgor, no rashes   Neurologic: Alert & oriented, normal mood   Cardiovascular: Regular rate & rhythm   Respiratory: Effort & rate normal, LCTAB, acyanotic   Abdomen: Soft, non tender   Extremities: normal strength, tone   Pelvic Exam:    Perineum: Normal perineum   Vulva: Normal, no lesions   Vagina:  Normal mucosa, thin white slightly malodorous d/c   Cervix: Normal, bulbous, appears closed   Uterus:  Normal size/shape/contour for GA   Thin prep pap smear obtained w/ reflex high risk HPV cotesting FHR: unable to hear w/ doppler, will get tasha to check   Results for orders placed in visit on 03/09/14 (from the past 24 hour(s))  POCT WET PREP (WET MOUNT)     Status: Abnormal   Collection Time    03/09/14 11:21 AM      Result Value Ref Range   Source Wet Prep POC vaginal     WBC, Wet Prep HPF POC none     Bacteria Wet Prep HPF POC none     BACTERIA WET PREP MORPHOLOGY POC       Clue Cells Wet Prep HPF POC Moderate     Clue Cells Wet Prep Whiff POC Positive Whiff     Yeast Wet Prep HPF POC None     KOH Wet Prep POC       Trichomonas Wet Prep HPF POC none      Assessment:   Pregnancy: G2P1001 Patient Active Problem List   Diagnosis Date Noted  . Supervision of normal pregnancy 03/09/2014    Priority: High    3042w6d G2P1001 New OB visit BV N/V of pregnancy Previous smoker- quit w/ +PT   Plan:  Initial  labs drawn Continue prenatal vitamins Problem list reviewed and updated Reviewed n/v relief measures and warning s/s to report Reviewed recommended weight gain based on pre-gravid BMI Encouraged well-balanced diet Genetic Screening discussed Integrated Screen: requested Cystic fibrosis screening discussed requested Ultrasound discussed; fetal survey: requested Follow up in 3 weeks for 1st it/nt and visit [facility]NC completed Treat BV after 1st trimester Wants flu shot at next visit  Marge DuncansBooker, Zuley Lutter Randall CNM, WHNP-BC 03/09/2014 11:22 AM     FHR: 180 via informal u/s w/ Rodney Boozetasha

## 2014-03-09 NOTE — Patient Instructions (Signed)

## 2014-03-09 NOTE — Addendum Note (Signed)
Addended by: Gaylyn RongEVANS, Ibrahem Volkman A on: 03/09/2014 01:57 PM   Modules accepted: Orders

## 2014-03-10 ENCOUNTER — Encounter: Payer: Self-pay | Admitting: Women's Health

## 2014-03-10 DIAGNOSIS — O09899 Supervision of other high risk pregnancies, unspecified trimester: Secondary | ICD-10-CM | POA: Insufficient documentation

## 2014-03-10 DIAGNOSIS — Z283 Underimmunization status: Secondary | ICD-10-CM | POA: Insufficient documentation

## 2014-03-10 DIAGNOSIS — Z2839 Other underimmunization status: Secondary | ICD-10-CM

## 2014-03-10 DIAGNOSIS — O9989 Other specified diseases and conditions complicating pregnancy, childbirth and the puerperium: Secondary | ICD-10-CM

## 2014-03-10 HISTORY — DX: Other underimmunization status: Z28.39

## 2014-03-10 HISTORY — DX: Supervision of other high risk pregnancies, unspecified trimester: O09.899

## 2014-03-10 LAB — VARICELLA ZOSTER ANTIBODY, IGG: Varicella IgG: 2960 Index — ABNORMAL HIGH (ref <135.00)

## 2014-03-10 LAB — ABO AND RH: Rh Type: POSITIVE

## 2014-03-10 LAB — HIV ANTIBODY (ROUTINE TESTING W REFLEX): HIV 1&2 Ab, 4th Generation: NONREACTIVE

## 2014-03-10 LAB — RPR

## 2014-03-10 LAB — RUBELLA SCREEN: RUBELLA: 0.4 {index} (ref <0.90)

## 2014-03-10 LAB — HEPATITIS B SURFACE ANTIGEN: HEP B S AG: NEGATIVE

## 2014-03-10 LAB — ANTIBODY SCREEN: ANTIBODY SCREEN: NEGATIVE

## 2014-03-11 ENCOUNTER — Encounter: Payer: Medicaid Other | Admitting: Obstetrics & Gynecology

## 2014-03-11 ENCOUNTER — Encounter: Payer: Self-pay | Admitting: Women's Health

## 2014-03-11 LAB — CYSTIC FIBROSIS DIAGNOSTIC STUDY

## 2014-03-11 LAB — CYTOLOGY - PAP

## 2014-03-15 DIAGNOSIS — O021 Missed abortion: Secondary | ICD-10-CM

## 2014-03-15 HISTORY — DX: Missed abortion: O02.1

## 2014-03-16 ENCOUNTER — Encounter: Payer: Self-pay | Admitting: Women's Health

## 2014-03-23 ENCOUNTER — Ambulatory Visit (INDEPENDENT_AMBULATORY_CARE_PROVIDER_SITE_OTHER): Payer: Medicaid Other | Admitting: Women's Health

## 2014-03-23 ENCOUNTER — Encounter: Payer: Self-pay | Admitting: Women's Health

## 2014-03-23 VITALS — BP 128/90 | Ht 63.5 in | Wt 149.4 lb

## 2014-03-23 DIAGNOSIS — Z30018 Encounter for initial prescription of other contraceptives: Secondary | ICD-10-CM

## 2014-03-23 DIAGNOSIS — O039 Complete or unspecified spontaneous abortion without complication: Secondary | ICD-10-CM

## 2014-03-23 MED ORDER — NORELGESTROMIN-ETH ESTRADIOL 150-35 MCG/24HR TD PTWK: 1.0000 | MEDICATED_PATCH | TRANSDERMAL | Status: DC

## 2014-03-23 NOTE — Progress Notes (Signed)
Patient ID: Ann Moore Bottger, female   DOB: 10/20/1990, 23 y.o.   MRN: [id number]   [facility]  Patient name: Ann Moore Indelicato MRN [id number]  Date of birth: 06/07/1990  CC & HPI:  Ann Moore Reesman is a 23 y.o. Caucasian 312P1011 female presenting today for f/u after incomplete SAB w/ Moore&C [facility] 03/[facility] at 10wks. Had started cramping really bad, went to [facility] to get apap, came home and started 'pouring blood'. Wants to start contraception, interested in patch- her friend has it and she really likes it- not interested in nuva ring or pills. Does not smoke, no h/o HTN, DVT/PE, CVA, MI, or migraines w/ aura.  Did have BV at 1st pnv, no longer having any sx- so will not treat.   Pertinent History Reviewed:  Medical & Surgical Hx:   Past Medical History  Diagnosis Date  . Medical history non-contributory    Past Surgical History  Procedure Laterality Date  . No past surgeries     Medications: Reviewed & Updated - see associated section Social History: Reviewed -  reports that she has quit smoking. Her smoking use included Cigarettes. She smoked 0.50 packs per day. She does not have any smokeless tobacco history on file.  Objective Findings:  Vitals: BP 128/90  Ht 5' 3.5" (1.613 m)  Wt 149 lb 6.4 oz (67.767 kg)  BMI 26.05 kg/m2  LMP 09/12/2013  Breastfeeding? Unknown  Physical Examination: General appearance - alert, well appearing, and in no distress  No results found for this or any previous visit (from the past 24 hour(s)).   Assessment & Plan:  A:   S/p 10wk SAB w/ Moore&C  Contraception initiation P:  Rx ortho evra w/ 12RF   F/U 3 months for f/u   Marge DuncansBooker, Javius Sylla Randall CNM, [facility]NP-BC 03/23/2014 12:47 PM

## 2014-03-23 NOTE — Patient Instructions (Addendum)
Ethinyl Estradiol; Norelgestromin skin patches- Ortho Evra What is this medicine? ETHINYL ESTRADIOL;NORELGESTROMIN (ETH in il es tra DYE ole; nor el JES troe min) skin patch is used as a contraceptive (birth control method). This medicine combines two types of female hormones, an estrogen and a progestin. This patch is used to prevent ovulation and pregnancy. This medicine may be used for other purposes; ask your health care provider or pharmacist if you have questions. COMMON BRAND NAME(S): Ortho Christianne Borrow What should I tell my health care provider before I take this medicine? They need to know if you have or ever had any of these conditions: -abnormal vaginal bleeding -blood vessel disease or blood clots -breast, cervical, endometrial, ovarian, liver, or uterine cancer -diabetes -gallbladder disease -heart disease or recent heart attack -high blood pressure -high cholesterol -kidney disease -liver disease -migraine headaches -stroke -systemic lupus erythematosus (SLE) -tobacco smoker -an unusual or allergic reaction to estrogens, progestins, other medicines, foods, dyes, or preservatives -pregnant or trying to get pregnant -breast-feeding How should I use this medicine? This patch is applied to the skin. Follow the directions on the prescription label. Apply to clean, dry, healthy skin on the buttock, abdomen, upper outer arm or upper torso, in a place where it will not be rubbed by tight clothing. Do not use lotions or other cosmetics on the site where the patch will go. Press the patch firmly in place for 10 seconds to ensure good contact with the skin. Change the patch every 7 days on the same day of the week for 3 weeks. You will then have a break from the patch for 1 week, after which you will apply a new patch. Do not use your medicine more often than directed. Contact your pediatrician regarding the use of this medicine in children. Special care may be needed. This medicine has  been used in female children who have started having menstrual periods. A patient package insert for the product will be given with each prescription and refill. Read this sheet carefully each time. The sheet may change frequently. Overdosage: If you think you have taken too much of this medicine contact a poison control center or emergency room at once. NOTE: This medicine is only for you. Do not share this medicine with others. What if I miss a dose? You will need to replace your patch once a week as directed. If your patch is lost or falls off, contact your health care professional for advice. You may need to use another form of birth control if your patch has been off for more than 1 day. What may interact with this medicine? -acetaminophen -antibiotics or medicines for infections, especially rifampin, rifabutin, rifapentine, and griseofulvin, and possibly penicillins or tetracyclines -aprepitant -ascorbic acid (vitamin C) -atorvastatin -barbiturate medicines, such as phenobarbital -bosentan -carbamazepine -caffeine -clofibrate -cyclosporine -dantrolene -doxercalciferol -felbamate -grapefruit juice -hydrocortisone -medicines for anxiety or sleeping problems, such as diazepam or temazepam -medicines for diabetes, including pioglitazone -modafinil -mycophenolate -nefazodone -oxcarbazepine -phenytoin -prednisolone -ritonavir or other medicines for HIV infection or AIDS -rosuvastatin -selegiline -soy isoflavones supplements -St. John's wort -tamoxifen or raloxifene -theophylline -thyroid hormones -topiramate -warfarin This list may not describe all possible interactions. Give your health care provider a list of all the medicines, herbs, non-prescription drugs, or dietary supplements you use. Also tell them if you smoke, drink alcohol, or use illegal drugs. Some items may interact with your medicine. What should I watch for while using this medicine? Visit your doctor or  health care professional  for regular checks on your progress. You will need a regular breast and pelvic exam and Pap smear while on this medicine. Use an additional method of contraception during the first cycle that you use this patch. If you have any reason to think you are pregnant, stop using this medicine right away and contact your doctor or health care professional. If you are using this medicine for hormone related problems, it may take several cycles of use to see improvement in your condition. Smoking increases the risk of getting a blood clot or having a stroke while you are using hormonal birth control, especially if you are more than 22 years old. You are strongly advised not to smoke. This medicine can make your body retain fluid, making your fingers, hands, or ankles swell. Your blood pressure can go up. Contact your doctor or health care professional if you feel you are retaining fluid. This medicine can make you more sensitive to the sun. Keep out of the sun. If you cannot avoid being in the sun, wear protective clothing and use sunscreen. Do not use sun lamps or tanning beds/booths. If you wear contact lenses and notice visual changes, or if the lenses begin to feel uncomfortable, consult your eye care specialist. In some women, tenderness, swelling, or minor bleeding of the gums may occur. Notify your dentist if this happens. Brushing and flossing your teeth regularly may help limit this. See your dentist regularly and inform your dentist of the medicines you are taking. If you are going to have elective surgery or a MRI, you may need to stop using this medicine before the surgery or MRI. Consult your health care professional for advice. This medicine does not protect you against HIV infection (AIDS) or any other sexually transmitted diseases. What side effects may I notice from receiving this medicine? Side effects that you should report to your doctor or health care professional as  soon as possible: -breast tissue changes or discharge -changes in vaginal bleeding during your period or between your periods -chest pain -coughing up blood -dizziness or fainting spells -headaches or migraines -leg, arm or groin pain -severe or sudden headaches -stomach pain (severe) -sudden shortness of breath -sudden loss of coordination, especially on one side of the body -speech problems -symptoms of vaginal infection like itching, irritation or unusual discharge -tenderness in the upper abdomen -vomiting -weakness or numbness in the arms or legs, especially on one side of the body -yellowing of the eyes or skin Side effects that usually do not require medical attention (report to your doctor or health care professional if they continue or are bothersome): -breakthrough bleeding and spotting that continues beyond the 3 initial cycles of pills -breast tenderness -mood changes, anxiety, depression, frustration, anger, or emotional outbursts -increased sensitivity to sun or ultraviolet light -nausea -skin rash, acne, or brown spots on the skin -weight gain (slight) This list may not describe all possible side effects. Call your doctor for medical advice about side effects. You may report side effects to FDA at 1-800-FDA-1088. Where should I keep my medicine? Keep out of the reach of children. Store at room temperature between 15 and 30 degrees C (59 and 86 degrees F). Keep the patch in its pouch until time of use. Throw away any unused medicine after the expiration date. Dispose of used patches properly. Since a used patch may still contain active hormones, fold the patch in half so that it sticks to itself prior to disposal. Throw away in a place  where children or pets cannot reach. NOTE: This sheet is a summary. It may not cover all possible information. If you have questions about this medicine, talk to your doctor, pharmacist, or health care provider.  2015, Elsevier/Gold  Standard. (2008-04-30 12:06:24)  Miscarriage A miscarriage is the sudden loss of an unborn baby (fetus) before the 20th week of pregnancy. Most miscarriages happen in the first 3 months of pregnancy. Sometimes, it happens before a woman even knows she is pregnant. A miscarriage is also called a "spontaneous miscarriage" or "early pregnancy loss." Having a miscarriage can be an emotional experience. Talk with your caregiver about any questions you may have about miscarrying, the grieving process, and your future pregnancy plans. CAUSES   Problems with the fetal chromosomes that make it impossible for the baby to develop normally. Problems with the baby's genes or chromosomes are most often the result of errors that occur, by chance, as the embryo divides and grows. The problems are not inherited from the parents.  Infection of the cervix or uterus.   Hormone problems.   Problems with the cervix, such as having an incompetent cervix. This is when the tissue in the cervix is not strong enough to hold the pregnancy.   Problems with the uterus, such as an abnormally shaped uterus, uterine fibroids, or congenital abnormalities.   Certain medical conditions.   Smoking, drinking alcohol, or taking illegal drugs.   Trauma.  Often, the cause of a miscarriage is unknown.  SYMPTOMS   Vaginal bleeding or spotting, with or without cramps or pain.  Pain or cramping in the abdomen or lower back.  Passing fluid, tissue, or blood clots from the vagina. DIAGNOSIS  Your caregiver will perform a physical exam. You may also have an ultrasound to confirm the miscarriage. Blood or urine tests may also be ordered. TREATMENT   Sometimes, treatment is not necessary if you naturally pass all the fetal tissue that was in the uterus. If some of the fetus or placenta remains in the body (incomplete miscarriage), tissue left behind may become infected and must be removed. Usually, a dilation and curettage (D  and C) procedure is performed. During a D and C procedure, the cervix is widened (dilated) and any remaining fetal or placental tissue is gently removed from the uterus.  Antibiotic medicines are prescribed if there is an infection. Other medicines may be given to reduce the size of the uterus (contract) if there is a lot of bleeding.  If you have Rh negative blood and your baby was Rh positive, you will need a Rh immunoglobulin shot. This shot will protect any future baby from having Rh blood problems in future pregnancies. HOME CARE INSTRUCTIONS   Your caregiver may order bed rest or may allow you to continue light activity. Resume activity as directed by your caregiver.  Have someone help with home and family responsibilities during this time.   Keep track of the number of sanitary pads you use each day and how soaked (saturated) they are. Write down this information.   Do not use tampons. Do not douche or have sexual intercourse until approved by your caregiver.   Only take over-the-counter or prescription medicines for pain or discomfort as directed by your caregiver.   Do not take aspirin. Aspirin can cause bleeding.   Keep all follow-up appointments with your caregiver.   If you or your partner have problems with grieving, talk to your caregiver or seek counseling to help cope with the pregnancy  loss. Allow enough time to grieve before trying to get pregnant again.  SEEK IMMEDIATE MEDICAL CARE IF:   You have severe cramps or pain in your back or abdomen.  You have a fever.  You pass large blood clots (walnut-sized or larger) ortissue from your vagina. Save any tissue for your caregiver to inspect.   Your bleeding increases.   You have a thick, bad-smelling vaginal discharge.  You become lightheaded, weak, or you faint.   You have chills.  MAKE SURE YOU:  Understand these instructions.  Will watch your condition.  Will get help right away if you are not  doing well or get worse. Document Released: 11/08/2000 Document Revised: 09/09/2012 Document Reviewed: 07/04/2011 [facility] Patient Information 2015 [place], [place]. This information is not intended to replace advice given to you by your health care provider. Make sure you discuss any questions you have with your health care provider.

## 2014-03-30 ENCOUNTER — Encounter: Payer: Self-pay | Admitting: Women's Health

## 2014-03-30 ENCOUNTER — Encounter: Payer: Medicaid Other | Admitting: Women's Health

## 2014-03-30 ENCOUNTER — Other Ambulatory Visit: Payer: Medicaid Other

## 2014-03-31 ENCOUNTER — Encounter (HOSPITAL_COMMUNITY): Payer: Self-pay | Admitting: *Deleted

## 2014-03-31 ENCOUNTER — Emergency Department (HOSPITAL_COMMUNITY)
Admission: EM | Admit: 2014-03-31 | Discharge: 2014-03-31 | Disposition: A | Discharge status: Home / Self Care | Payer: Medicaid Other | Attending: Emergency Medicine | Admitting: Emergency Medicine

## 2014-03-31 DIAGNOSIS — Z8759 Personal history of other complications of pregnancy, childbirth and the puerperium: Secondary | ICD-10-CM | POA: Diagnosis not present

## 2014-03-31 DIAGNOSIS — Z79899 Other long term (current) drug therapy: Secondary | ICD-10-CM | POA: Insufficient documentation

## 2014-03-31 DIAGNOSIS — N938 Other specified abnormal uterine and vaginal bleeding: Secondary | ICD-10-CM | POA: Diagnosis not present

## 2014-03-31 DIAGNOSIS — N939 Abnormal uterine and vaginal bleeding, unspecified: Secondary | ICD-10-CM | POA: Diagnosis present

## 2014-03-31 DIAGNOSIS — Z87891 Personal history of nicotine dependence: Secondary | ICD-10-CM | POA: Diagnosis not present

## 2014-03-31 LAB — I-STAT BETA HCG BLOOD, ED (MC, WL, AP ONLY): I-stat hCG, quantitative: 86.5 m[IU]/mL — ABNORMAL HIGH (ref <5)

## 2014-03-31 NOTE — Discharge Instructions (Signed)
Abnormal Uterine Bleeding Call Dr. Rayna SextonFerguson's office tomorrow to arrange to be seen this week. Tell him that you had labwork done here. Your quantitative hCG is 86. Your blood should be redrawn to make sure that that number is steadily decreasing. Return here for fainting, lightheadedness, abdominal pain, fever or if you feel worse for any reason Abnormal uterine bleeding means bleeding from the vagina that is not your normal menstrual period. This can be:  Bleeding or spotting between periods.  Bleeding after sex (sexual intercourse).  Bleeding that is heavier or more than normal.  Periods that last longer than usual.  Bleeding after menopause. There are many problems that may cause this. Treatment will depend on the cause of the bleeding. Any kind of bleeding that is not normal should be reviewed by your doctor.  HOME CARE Watch your condition for any changes. These actions may lessen any discomfort you are having:  Do not use tampons or douches as told by your doctor.  Change your pads often. You should get regular pelvic exams and Pap tests. Keep all appointments for tests as told by your doctor. GET HELP IF:  You are bleeding for more than 1 week.  You feel dizzy at times. GET HELP RIGHT AWAY IF:   You pass out.  You have to change pads every 15 to 30 minutes.  You have belly pain.  You have a fever.  You become sweaty or weak.  You are passing large blood clots from the vagina.  You feel sick to your stomach (nauseous) and throw up (vomit). MAKE SURE YOU:  Understand these instructions.  Will watch your condition.  Will get help right away if you are not doing well or get worse. Document Released: 03/12/2009 Document Revised: 05/20/2013 Document Reviewed: 12/12/2012 [facility] Patient Information 2015 [place], [place]. This information is not intended to replace advice given to you by your health care provider. Make sure you discuss any questions you have with your  health care provider.

## 2014-03-31 NOTE — ED Notes (Signed)
Pt with vaginal bleeding today, recent miscarriage 2 weeks ago and has f/u with OB/GYN and was told she was on her period last week, denies pain

## 2014-03-31 NOTE — ED Provider Notes (Signed)
CSN: [id number]     Arrival date & time 03/31/14  1654 History   First MD Initiated Contact with Patient 03/31/14 2059     Chief Complaint  Patient presents with  . Vaginal Bleeding     (Consider location/radiation/quality/duration/timing/severity/associated sxs/prior Treatment) HPI Patient reports vaginal bleeding today  starting at 4 PMpatient had a miscarriage 12 days ago followed by a D&C and subsequently by normal menstrual period last week. She developed further vaginal bleeding today. She denies other associated symptoms. Denies pain denies lightheadedness no other associated symptoms.no other associated symptomsno treatment prior to coming here Past Medical History  Diagnosis Date  . Medical history non-contributory    Past Surgical History  Procedure Laterality Date  . No past surgeries    . Dilation and curettage of uterus     Family History  Problem Relation Age of Onset  . Hypertension Mother   . Diabetes Sister   . Diabetes Maternal Grandfather   . Cancer Maternal Grandfather     liver, lung   History  Substance Use Topics  . Smoking status: Former Smoker -- 0.50 packs/day    Types: Cigarettes  . Smokeless tobacco: Not on file  . Alcohol Use: No     Comment: occ   OB History    Gravida Para Term Preterm AB TAB SAB Ectopic Multiple Living   2 1 1  0 1 0 1 0 0 1     Review of Systems  Constitutional: Negative.   HENT: Negative.   Respiratory: Negative.   Cardiovascular: Negative.   Gastrointestinal: Negative.   Genitourinary: Positive for vaginal bleeding.  Musculoskeletal: Negative.   Skin: Negative.   Neurological: Negative.   Psychiatric/Behavioral: Negative.   All other systems reviewed and are negative.     Allergies  Hydrocodone  Home Medications   Prior to Admission medications   Medication Sig Start Date End Date Taking? Authorizing Provider  acetaminophen (TYLENOL) 325 MG tablet Take 650 mg by mouth every 6 (six) hours as needed.     Historical Provider, MD  Doxylamine-Pyridoxine (DICLEGIS) 10-10 MG TBEC Take as directed 02/24/14   Adline PotterJennifer A Griffin, NP  ibuprofen (ADVIL,MOTRIN) 200 MG tablet Take 800 mg by mouth every 8 (eight) hours as needed. pain    Historical Provider, MD  norelgestromin-ethinyl estradiol (ORTHO EVRA) 150-35 MCG/24HR transdermal patch Place 1 patch onto the skin once a week. 03/23/14   Marge DuncansKimberly Randall Booker, CNM  Prenat-FeFum-FePo-FA-Omega 3 (CONCEPT DHA) 53.5-38-1 MG CAPS Take 1 capsule by mouth daily. 03/09/14   Marge DuncansKimberly Randall Booker, CNM  Prenatal Vit-Min-FA-Fish Oil ([employer] PRENATAL GUMMY PO) Take by mouth.    Historical Provider, MD  traMADol (ULTRAM) 50 MG tablet Take 1 tablet (50 mg total) by mouth every 6 (six) hours as needed. 09/23/13   Geoffery Lyonsouglas Delo, MD   BP 127/83 mmHg  Pulse 87  Temp(Src) 98.9 F (37.2 C) (Oral)  Resp 18  Ht 5' 3.5" (1.613 m)  Wt 149 lb 3 oz (67.671 kg)  BMI 26.01 kg/m2  SpO2 97%  LMP 03/21/2014 Physical Exam  Constitutional: She appears well-developed and well-nourished.  HENT:  Head: Normocephalic and atraumatic.  Eyes: Conjunctivae are normal. Pupils are equal, round, and reactive to light.  Neck: Neck supple. No tracheal deviation present. No thyromegaly present.  Cardiovascular: Normal rate and regular rhythm.   No murmur heard. Pulmonary/Chest: Effort normal and breath sounds normal.  Abdominal: Soft. Bowel sounds are normal. She exhibits no distension. There is no tenderness.  Genitourinary:  No external lesion slight amount of blood in vault no active bleeding. Cervical os closed. No cervical motion tenderness no adnexal tenderness or masses  Musculoskeletal: Normal range of motion. She exhibits no edema or tenderness.  Neurological: She is alert. Coordination normal.  Skin: Skin is warm and dry. No rash noted.  Psychiatric: She has a normal mood and affect.  Nursing note and vitals reviewed.   ED Course  Procedures (including critical care  time) Labs Review Labs Reviewed - No data to display  Imaging Review No results found.   EKG Interpretation None     Results for orders placed or performed during the hospital encounter of 03/31/14  I-Stat beta hCG blood, ED  Result Value Ref Range   I-stat hCG, quantitative 86.5 (H) <5 mIU/mL   Comment 3           No results found.  MDM  In light of patient's recent D&C I suspect mildly elevated hCG is consistent with recent pregnancy. Possibility of retained products of conception exist the patient asymptomatic except for mild bleeding.ectopic pregnancy much less likely Plan contact Dr. Rayna SextonFerguson's office tomorrow to be seen this week. Suggest repeat hCG.no need for [place]. Patient's blood type O+ Final diagnoses:  None   WU:[id number]:dysfunctional uterine bleeding     Doug SouSam Jame Seelig, MD 03/31/14 2230

## 2014-04-03 ENCOUNTER — Encounter: Payer: Self-pay | Admitting: Obstetrics & Gynecology

## 2014-04-03 ENCOUNTER — Ambulatory Visit (INDEPENDENT_AMBULATORY_CARE_PROVIDER_SITE_OTHER): Payer: Medicaid Other | Admitting: Obstetrics & Gynecology

## 2014-04-03 VITALS — BP 130/84 | Ht 63.5 in | Wt 146.0 lb

## 2014-04-03 DIAGNOSIS — N938 Other specified abnormal uterine and vaginal bleeding: Secondary | ICD-10-CM

## 2014-04-03 DIAGNOSIS — O039 Complete or unspecified spontaneous abortion without complication: Secondary | ICD-10-CM

## 2014-04-03 NOTE — Progress Notes (Signed)
Patient ID: Ann Moore, female   DOB: 02/06/1991, 23 y.o.   MRN: [id number] Pt was seen in ER on 03/31/2014 for bleeding s/p incomplete SAB s/p D&C  No complaints today, minimal bleeding and minimal cramping Using ortho evra  Recommend continue patch and informed that she will probably have some unpredictable bleeding until she gets thryu the first month  Results for orders placed or performed during the hospital encounter of 03/31/14 (from the past 72 hour(s))  I-Stat beta hCG blood, ED     Status: Abnormal   Collection Time: 03/31/14 10:03 PM  Result Value Ref Range   I-stat hCG, quantitative 86.5 (H) <5 mIU/mL   Comment 3            Comment:   GEST. AGE      CONC.  (mIU/mL)   <=1 WEEK        5 - 50     2 WEEKS       50 - 500     3 WEEKS       100 - 10,000     4 WEEKS     1,000 - 30,000        FEMALE AND NON-PREGNANT FEMALE:     LESS THAN 5 mIU/mL

## 2014-05-19 IMAGING — US US PELVIS COMPLETE
1 series · 14 of 25 positions shown · non-contrast
Comparison: None

CLINICAL DATA: Mid pelvic pain

EXAM:
TRANSABDOMINAL AND TRANSVAGINAL ULTRASOUND OF PELVIS
TECHNIQUE: Both transabdominal and transvaginal ultrasound examinations of the
pelvis were performed. Transabdominal technique was performed for
global imaging of the pelvis including uterus, ovaries, adnexal
regions, and pelvic cul-de-sac. It was necessary to proceed with
endovaginal exam following the transabdominal exam to visualize the
uterus, endometrium, ovaries and adnexa .

[Series 1: us pelvis complete · 0.18mm/px · 14 of 75 slices shown]
[im 1/75]
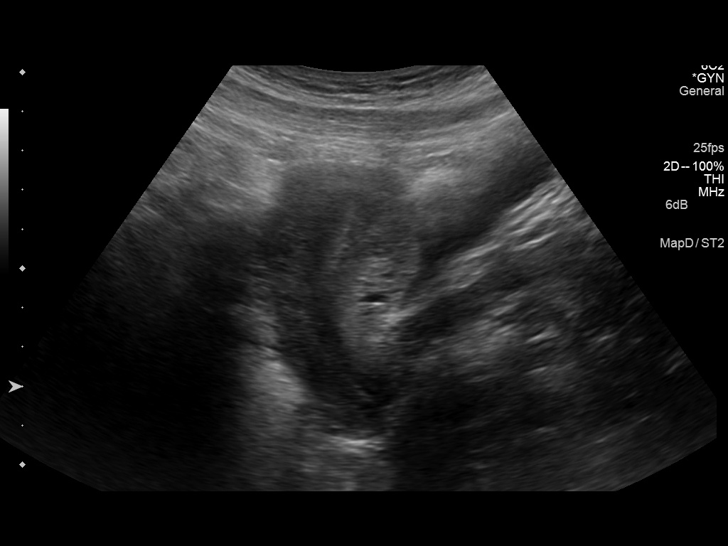
[im 7/75]
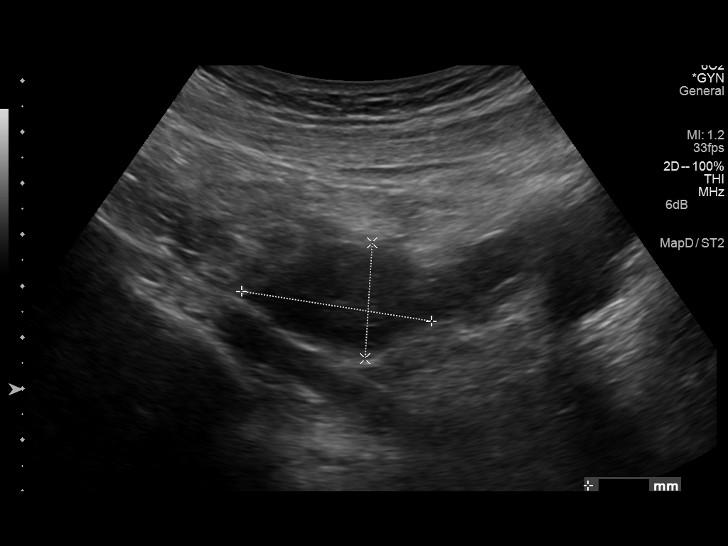
[im 13/75]
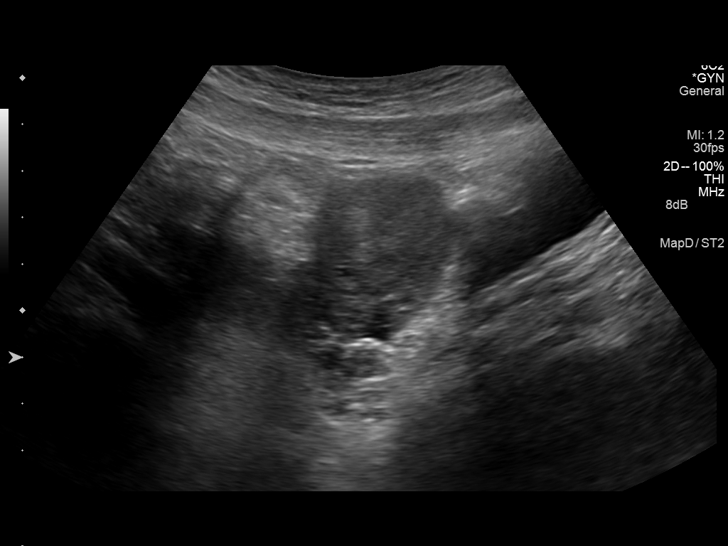
[im 19/75]
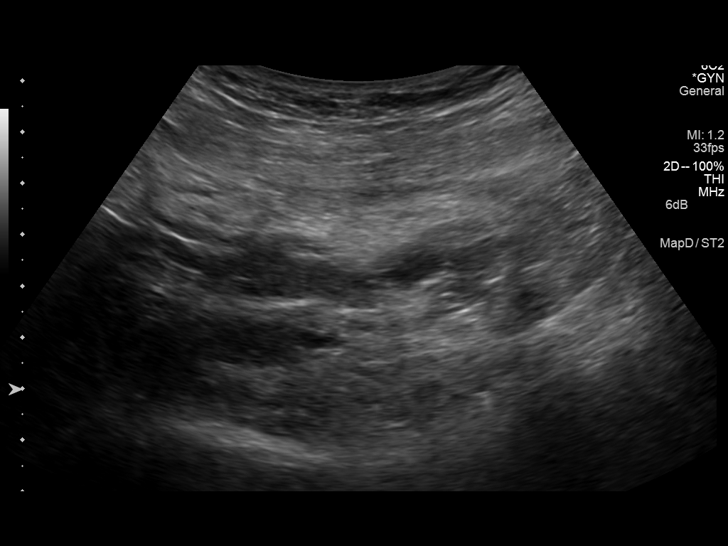
[im 25/75]
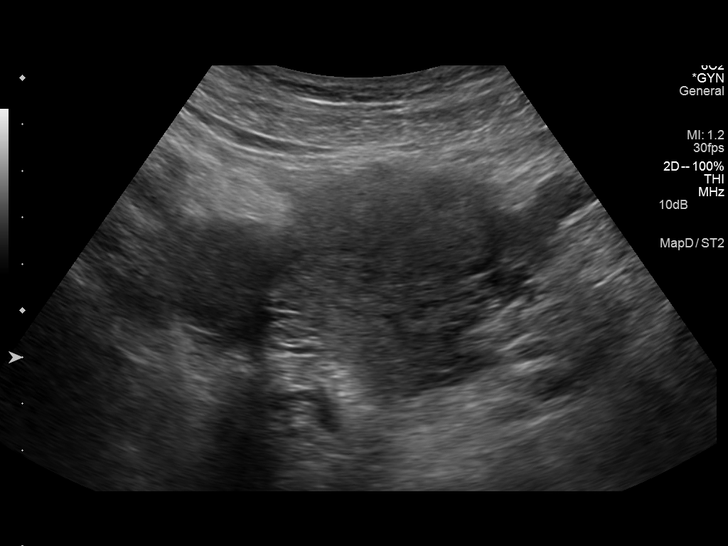
[im 28/75]
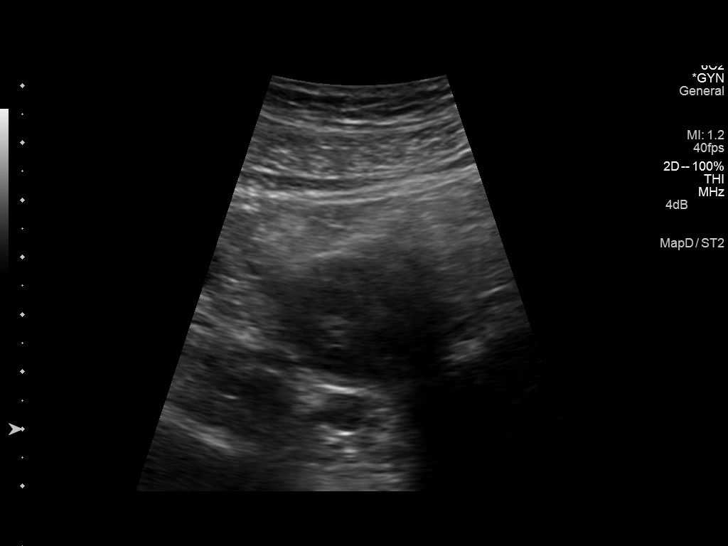
[im 34/75]
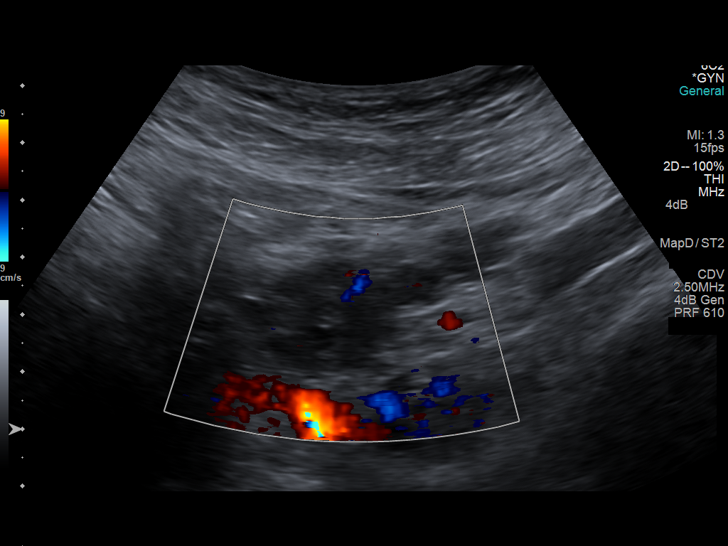
[im 41/75]
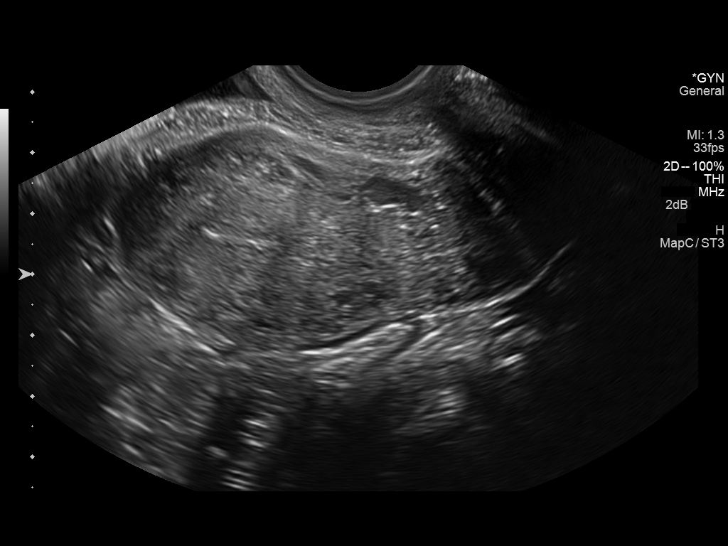
[im 47/75]
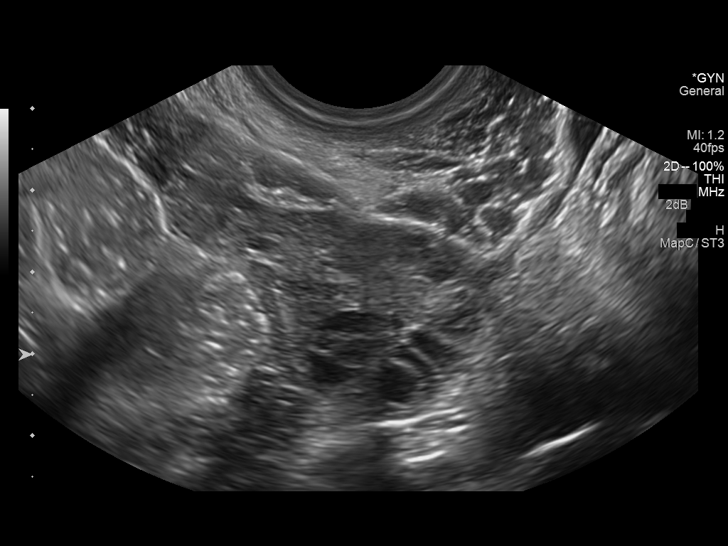
[im 50/75]
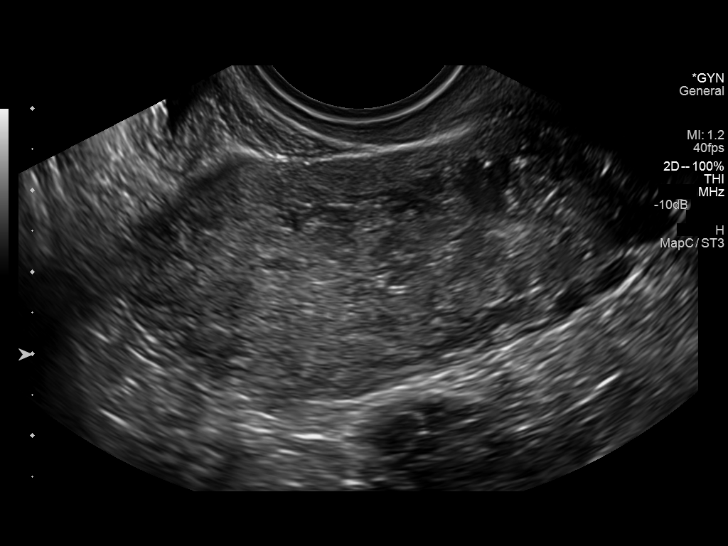
[im 56/75]
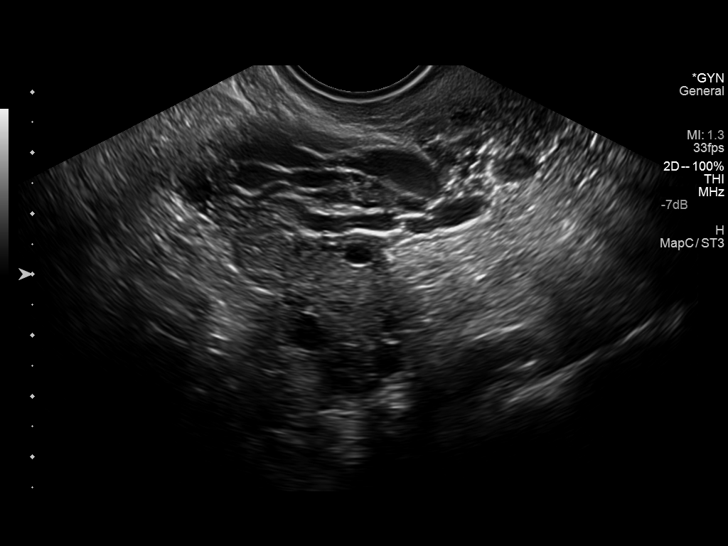
[im 62/75]
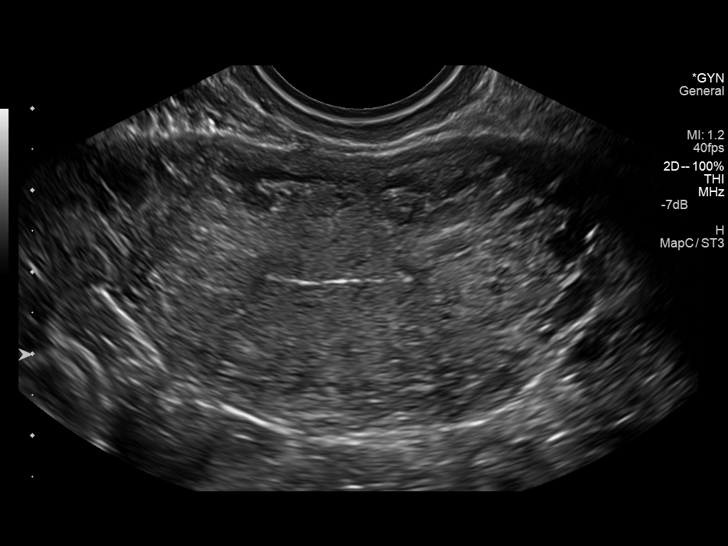
[im 68/75]
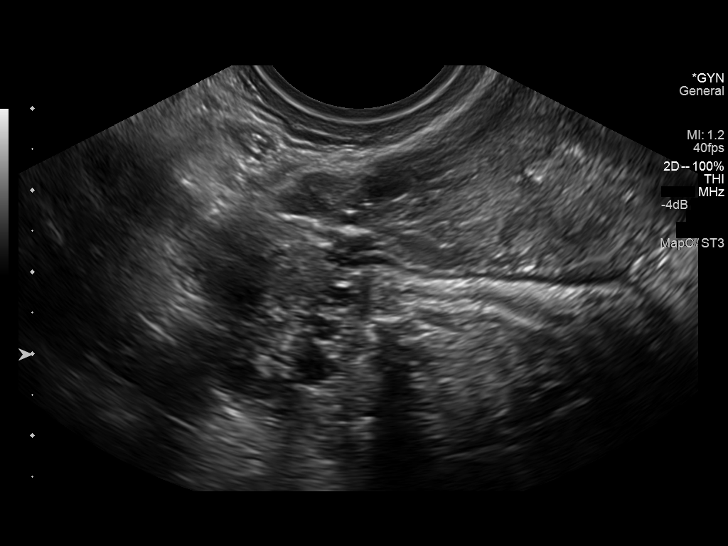
[im 75/75]
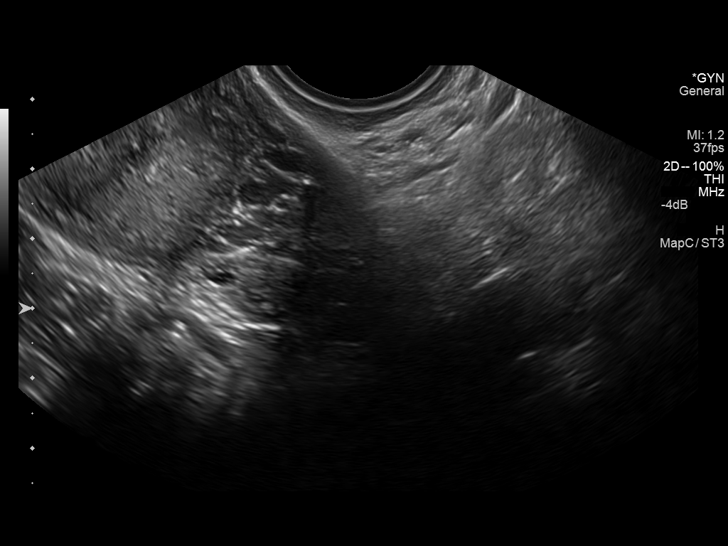

[14 of 25 positions shown; findings below may reference images not displayed]

FINDINGS: Uterus

Measurements: 8.8 x 4.0 x 6.5 cm. No fibroids or other mass
visualized.

Endometrium

Thickness: 3 mm.  No focal abnormality visualized.

Right ovary

Measurements: 2.8 x 2.5 x 2.0 cm. Multiple small follicles. Normal
appearance. No adnexal mass.

Left ovary

Measurements: 1.9 x 2.7 x 1.9 cm. Normal appearance/no adnexal mass.

Other findings

Small amount of free fluid in the pelvis.
IMPRESSION: Unremarkable pelvic ultrasound.

## 2014-06-09 ENCOUNTER — Ambulatory Visit (INDEPENDENT_AMBULATORY_CARE_PROVIDER_SITE_OTHER): Payer: Medicaid Other | Admitting: Women's Health

## 2014-06-09 ENCOUNTER — Encounter: Payer: Self-pay | Admitting: Women's Health

## 2014-06-09 DIAGNOSIS — B9689 Other specified bacterial agents as the cause of diseases classified elsewhere: Secondary | ICD-10-CM

## 2014-06-09 DIAGNOSIS — Z30011 Encounter for initial prescription of contraceptive pills: Secondary | ICD-10-CM

## 2014-06-09 DIAGNOSIS — A499 Bacterial infection, unspecified: Secondary | ICD-10-CM

## 2014-06-09 DIAGNOSIS — N760 Acute vaginitis: Secondary | ICD-10-CM

## 2014-06-09 MED ORDER — NORETHIN-ETH ESTRAD-FE BIPHAS 1 MG-10 MCG / 10 MCG PO TABS: 1.0000 | ORAL_TABLET | Freq: Every day | ORAL | Status: DC

## 2014-06-09 MED ORDER — METRONIDAZOLE 500 MG PO TABS: 500.0000 mg | ORAL_TABLET | Freq: Two times a day (BID) | ORAL | Status: DC

## 2014-06-09 NOTE — Progress Notes (Signed)
Patient ID: Ann Moore, female   DOB: 03/26/1991, 24 y.o.   MRN: [id number]   [facility]  Patient name: Ann Moore MRN [id number]  Date of birth: 10/27/1990  CC & HPI:  Ann Moore is a 24 y.o. Caucasian female presenting today w/ report of BTB on patch, wants to switch back to pills- was on Lo Loestrin in past and liked it. Always still notices malodorous d/c, dx w/ BV at beginning of pregnancy before miscarriage and d&c. No sex since Aug.   Pertinent History Reviewed:  Medical & Surgical Hx:   Past Medical History  Diagnosis Date  . Medical history non-contributory   . Missed abortion 03/15/2014   Past Surgical History  Procedure Laterality Date  . No past surgeries    . Dilation and curettage of uterus     Medications: Reviewed & Updated - see associated section Social History: Reviewed -  reports that she has quit smoking. Her smoking use included Cigarettes. She smoked 0.50 packs per day. She has never used smokeless tobacco.  Objective Findings:  Vitals: LMP 09/12/2013  Physical Examination: General appearance - alert, well appearing, and in no distress  No results found for this or any previous visit (from the past 24 hour(s)).   Assessment & Plan:  A:   Contraception management  BV P:  Rx LoLoestrin w/ 11RF  Rx flagyl 500mg  bid x 7d for bv, no etoh or sex while taking   F/U 3months for coc f/u   Marge DuncansBooker, Ree Alcalde Randall CNM, [facility] HospitalWHNP-BC 06/09/2014 3:24 PM

## 2014-06-23 ENCOUNTER — Ambulatory Visit: Payer: Medicaid Other | Admitting: Women's Health

## 2014-08-13 ENCOUNTER — Encounter (HOSPITAL_COMMUNITY): Payer: Self-pay | Admitting: *Deleted

## 2014-08-13 ENCOUNTER — Emergency Department (HOSPITAL_COMMUNITY)
Admission: EM | Admit: 2014-08-13 | Discharge: 2014-08-13 | Disposition: A | Discharge status: Home / Self Care | Payer: Medicaid Other | Attending: Emergency Medicine | Admitting: Emergency Medicine

## 2014-08-13 DIAGNOSIS — Z3202 Encounter for pregnancy test, result negative: Secondary | ICD-10-CM | POA: Insufficient documentation

## 2014-08-13 DIAGNOSIS — Z79899 Other long term (current) drug therapy: Secondary | ICD-10-CM | POA: Diagnosis not present

## 2014-08-13 DIAGNOSIS — R51 Headache: Secondary | ICD-10-CM | POA: Insufficient documentation

## 2014-08-13 DIAGNOSIS — Z720 Tobacco use: Secondary | ICD-10-CM | POA: Diagnosis not present

## 2014-08-13 DIAGNOSIS — R519 Headache, unspecified: Secondary | ICD-10-CM

## 2014-08-13 DIAGNOSIS — K529 Noninfective gastroenteritis and colitis, unspecified: Secondary | ICD-10-CM | POA: Insufficient documentation

## 2014-08-13 DIAGNOSIS — R42 Dizziness and giddiness: Secondary | ICD-10-CM | POA: Diagnosis present

## 2014-08-13 LAB — URINALYSIS, ROUTINE W REFLEX MICROSCOPIC
BILIRUBIN URINE: NEGATIVE
GLUCOSE, UA: NEGATIVE mg/dL
HGB URINE DIPSTICK: NEGATIVE
KETONES UR: NEGATIVE mg/dL
Nitrite: NEGATIVE
Protein, ur: NEGATIVE mg/dL
SPECIFIC GRAVITY, URINE: 1.025 (ref 1.005–1.030)
Urobilinogen, UA: 0.2 mg/dL (ref 0.0–1.0)
pH: 6.5 (ref 5.0–8.0)

## 2014-08-13 LAB — CBC WITH DIFFERENTIAL/PLATELET
BASOS PCT: 0 % (ref 0–1)
Basophils Absolute: 0 10*3/uL (ref 0.0–0.1)
EOS PCT: 1 % (ref 0–5)
Eosinophils Absolute: 0.1 10*3/uL (ref 0.0–0.7)
HEMATOCRIT: 44.7 % (ref 36.0–46.0)
Hemoglobin: 15 g/dL (ref 12.0–15.0)
LYMPHS ABS: 2.9 10*3/uL (ref 0.7–4.0)
LYMPHS PCT: 31 % (ref 12–46)
MCH: 30.1 pg (ref 26.0–34.0)
MCHC: 33.6 g/dL (ref 30.0–36.0)
MCV: 89.6 fL (ref 78.0–100.0)
MONO ABS: 0.9 10*3/uL (ref 0.1–1.0)
Monocytes Relative: 9 % (ref 3–12)
NEUTROS PCT: 59 % (ref 43–77)
Neutro Abs: 5.4 10*3/uL (ref 1.7–7.7)
PLATELETS: 226 10*3/uL (ref 150–400)
RBC: 4.99 MIL/uL (ref 3.87–5.11)
RDW: 13.3 % (ref 11.5–15.5)
WBC: 9.2 10*3/uL (ref 4.0–10.5)

## 2014-08-13 LAB — BASIC METABOLIC PANEL
ANION GAP: 10 (ref 5–15)
BUN: 9 mg/dL (ref 6–23)
CHLORIDE: 106 mmol/L (ref 96–112)
CO2: 25 mmol/L (ref 19–32)
Calcium: 9.8 mg/dL (ref 8.4–10.5)
Creatinine, Ser: 0.77 mg/dL (ref 0.50–1.10)
GFR calc Af Amer: >90 mL/min (ref >90)
Glucose, Bld: 70 mg/dL (ref 70–99)
POTASSIUM: 3.8 mmol/L (ref 3.5–5.1)
SODIUM: 141 mmol/L (ref 135–145)

## 2014-08-13 LAB — URINE MICROSCOPIC-ADD ON

## 2014-08-13 LAB — PREGNANCY, URINE: Preg Test, Ur: NEGATIVE

## 2014-08-13 LAB — CBG MONITORING, ED: Glucose-Capillary: 83 mg/dL (ref 70–99)

## 2014-08-13 MED ORDER — SODIUM CHLORIDE 0.9 % IV BOLUS (SEPSIS)
1000.0000 mL | Freq: Once | INTRAVENOUS | Status: AC
  Administered 2014-08-13: 1000 mL via INTRAVENOUS

## 2014-08-13 MED ORDER — DIPHENHYDRAMINE HCL 50 MG/ML IJ SOLN
25.0000 mg | Freq: Once | INTRAMUSCULAR | Status: AC
  Administered 2014-08-13: 25 mg via INTRAVENOUS
  Filled 2014-08-13: qty 1

## 2014-08-13 MED ORDER — ONDANSETRON HCL 4 MG/2ML IJ SOLN
4.0000 mg | Freq: Once | INTRAMUSCULAR | Status: AC
  Administered 2014-08-13: 4 mg via INTRAVENOUS
  Filled 2014-08-13: qty 2

## 2014-08-13 MED ORDER — KETOROLAC TROMETHAMINE 30 MG/ML IJ SOLN
30.0000 mg | Freq: Once | INTRAMUSCULAR | Status: AC
  Administered 2014-08-13: 30 mg via INTRAVENOUS
  Filled 2014-08-13: qty 1

## 2014-08-13 MED ORDER — SODIUM CHLORIDE 0.9 % IV BOLUS (SEPSIS): 1000.0000 mL | Freq: Once | INTRAVENOUS | Status: DC

## 2014-08-13 MED ORDER — PROMETHAZINE HCL 25 MG PO TABS: 25.0000 mg | ORAL_TABLET | Freq: Four times a day (QID) | ORAL | Status: DC | PRN

## 2014-08-13 MED ORDER — METOCLOPRAMIDE HCL 5 MG/ML IJ SOLN
10.0000 mg | Freq: Once | INTRAMUSCULAR | Status: AC
  Administered 2014-08-13: 10 mg via INTRAVENOUS
  Filled 2014-08-13: qty 2

## 2014-08-13 NOTE — Discharge Instructions (Signed)
Increase fluids. Avoid greasy foods. Medication for nausea.

## 2014-08-13 NOTE — ED Notes (Signed)
Pt states vomited x 10 yesterday. Pt states she is dizzy today, shaky. States that when she would stand up earlier, her heart would begin to race, denies this symptom at present and staets it was earlier today. NAD.

## 2014-08-13 NOTE — ED Provider Notes (Signed)
CSN: [id number]     Arrival date & time 08/13/14  1656 History   First MD Initiated Contact with Patient 08/13/14 1906     Chief Complaint  Patient presents with  . Dizziness     (Consider location/radiation/quality/duration/timing/severity/associated sxs/prior Treatment) HPI .Ann Moore.... Complains of vomiting 10 yesterday. Today she feels dizzy and shaky. No chronic health problems.  Review systems positive for headache. No stiff neck, fever, chills, chest pain, dyspnea, dysuria. Patient feels dehydrated. Severity is moderate. Past Medical History  Diagnosis Date  . Medical history non-contributory   . Missed abortion 03/15/2014   Past Surgical History  Procedure Laterality Date  . No past surgeries    . Dilation and curettage of uterus     Family History  Problem Relation Age of Onset  . Hypertension Mother   . Diabetes Sister   . Diabetes Maternal Grandfather   . Cancer Maternal Grandfather     liver, lung   History  Substance Use Topics  . Smoking status: Current Every Day Smoker -- 0.50 packs/day    Types: Cigarettes  . Smokeless tobacco: Never Used  . Alcohol Use: No     Comment: occ   OB History    Gravida Para Term Preterm AB TAB SAB Ectopic Multiple Living   2 1 1  0 1 0 1 0 0 1     Review of Systems  All other systems reviewed and are negative.     Allergies  Hydrocodone  Home Medications   Prior to Admission medications   Medication Sig Start Date End Date Taking? Authorizing Provider  Norethindrone-Ethinyl Estradiol-Fe Biphas (LO LOESTRIN FE) 1 MG-10 MCG / 10 MCG tablet Take 1 tablet by mouth daily. 06/09/14  Yes Cheral MarkerKimberly R Booker, CNM  acetaminophen (TYLENOL) 325 MG tablet Take 650 mg by mouth every 6 (six) hours as needed (pain).     Historical Provider, MD  Doxylamine-Pyridoxine (DICLEGIS) 10-10 MG TBEC Take as directed Patient not taking: Reported on 06/09/2014 02/24/14   Adline PotterJennifer A Griffin, NP  ibuprofen (ADVIL,MOTRIN) 200 MG tablet Take 800 mg by  mouth every 8 (eight) hours as needed. pain    Historical Provider, MD  metroNIDAZOLE (FLAGYL) 500 MG tablet Take 1 tablet (500 mg total) by mouth 2 (two) times daily. X 7 days Patient not taking: Reported on 08/13/2014 06/09/14   Cheral MarkerKimberly R Booker, CNM  Prenat-FeFum-FePo-FA-Omega 3 (CONCEPT DHA) 53.5-38-1 MG CAPS Take 1 capsule by mouth daily. Patient not taking: Reported on 06/09/2014 03/09/14   Cheral MarkerKimberly R Booker, CNM  promethazine (PHENERGAN) 25 MG tablet Take 1 tablet (25 mg total) by mouth every 6 (six) hours as needed. 08/13/14   Donnetta HutchingBrian Donnelle Rubey, MD  traMADol (ULTRAM) 50 MG tablet Take 1 tablet (50 mg total) by mouth every 6 (six) hours as needed. Patient not taking: Reported on 06/09/2014 09/23/13   Geoffery Lyonsouglas Delo, MD   BP 109/82 mmHg  Pulse 82  Temp(Src) 98.5 F (36.9 C) (Oral)  Resp 18  Ht 5\' 3"  (1.6 m)  Wt 147 lb 11.2 oz (66.996 kg)  BMI 26.17 kg/m2  SpO2 98%  LMP 09/09/2013 (LMP Unknown) Physical Exam  Constitutional: She is oriented to person, place, and time.  Looks slightly dehydrated  HENT:  Head: Normocephalic and atraumatic.  Eyes: Conjunctivae and EOM are normal. Pupils are equal, round, and reactive to light.  Neck: Normal range of motion. Neck supple.  Cardiovascular: Normal rate and regular rhythm.   Pulmonary/Chest: Effort normal and breath sounds normal.  Abdominal: Soft.  Bowel sounds are normal.  Musculoskeletal: Normal range of motion.  Neurological: She is alert and oriented to person, place, and time.  Skin: Skin is warm and dry.  Psychiatric: She has a normal mood and affect. Her behavior is normal.  Nursing note and vitals reviewed.   ED Course  Procedures (including critical care time) Labs Review Labs Reviewed  URINALYSIS, ROUTINE W REFLEX MICROSCOPIC - Abnormal; Notable for the following:    APPearance HAZY (*)    Leukocytes, UA SMALL (*)    All other components within normal limits  URINE MICROSCOPIC-ADD ON - Abnormal; Notable for the following:     Squamous Epithelial / LPF FEW (*)    Bacteria, UA MANY (*)    All other components within normal limits  CBC WITH DIFFERENTIAL/PLATELET  BASIC METABOLIC PANEL  PREGNANCY, URINE  CBG MONITORING, ED    Imaging Review No results found.   EKG Interpretation None      MDM   Final diagnoses:  Gastroenteritis  Headache, unspecified headache type    No neurological deficits. Patient feels better after IV fluids, IV Toradol, IV Reglan, IV Benadryl. Patient feels much better at discharge.    Donnetta Hutching, MD 08/19/14 [phone]

## 2014-08-13 NOTE — ED Notes (Signed)
Pt requesting to go home before fluids are completed.

## 2014-08-16 LAB — URINE CULTURE
Colony Count: 80000
Special Requests: NORMAL

## 2014-08-17 NOTE — Progress Notes (Signed)
ED Antimicrobial Stewardship Positive Culture Follow Up   Ann PiedraCourtney D Moore is an 24 y.o. female who presented [facility]2014 with a chief complaint of  Chief Complaint  Patient presents with  . Dizziness    Recent Results (from the past 720 hour(s))  Urine culture     Status: None   Collection Time: 08/13/14 10:05 PM  Result Value Ref Range Status   Specimen Description URINE, CLEAN CATCH  Final   Special Requests Normal  Final   Colony Count   Final    80,000 COLONIES/ML Performed at [facility]    Culture   Final    ESCHERICHIA COLI Performed at [facility]    Report Status 08/16/2014 FINAL  Final   Organism ID, Bacteria ESCHERICHIA COLI  Final      Susceptibility   Escherichia coli - MIC*    AMPICILLIN >=32 RESISTANT Resistant     CEFAZOLIN <=4 SENSITIVE Sensitive     CEFTRIAXONE <=1 SENSITIVE Sensitive     CIPROFLOXACIN <=0.25 SENSITIVE Sensitive     GENTAMICIN <=1 SENSITIVE Sensitive     LEVOFLOXACIN <=0.12 SENSITIVE Sensitive     NITROFURANTOIN <=16 SENSITIVE Sensitive     TOBRAMYCIN <=1 SENSITIVE Sensitive     TRIMETH/SULFA <=20 SENSITIVE Sensitive     PIP/TAZO <=4 SENSITIVE Sensitive     * ESCHERICHIA COLI    Urine micro indicates a contaminated specimen. Colony count is low.  No treatment required.   Sallee Provencalurner, Lean Jaeger S 08/17/2014, 9:05 AM Infectious Diseases Pharmacist Phone# [phone]

## 2014-09-24 ENCOUNTER — Encounter: Payer: Self-pay | Admitting: Adult Health

## 2014-09-24 ENCOUNTER — Ambulatory Visit: Payer: Medicaid Other | Admitting: Adult Health

## 2014-11-11 ENCOUNTER — Ambulatory Visit (INDEPENDENT_AMBULATORY_CARE_PROVIDER_SITE_OTHER): Payer: Medicaid Other | Admitting: Women's Health

## 2014-11-11 ENCOUNTER — Encounter: Payer: Self-pay | Admitting: Women's Health

## 2014-11-11 VITALS — BP 122/62 | HR 76 | Wt 145.0 lb

## 2014-11-11 DIAGNOSIS — Z3009 Encounter for other general counseling and advice on contraception: Secondary | ICD-10-CM | POA: Diagnosis not present

## 2014-11-11 DIAGNOSIS — F418 Other specified anxiety disorders: Secondary | ICD-10-CM | POA: Diagnosis not present

## 2014-11-11 MED ORDER — SERTRALINE HCL 25 MG PO TABS: 25.0000 mg | ORAL_TABLET | Freq: Every day | ORAL | Status: DC

## 2014-11-11 NOTE — Progress Notes (Signed)
Patient ID: [name], female   DOB: 1991-05-24, 24 y.o.   MRN: [id number]   [facility] Visit  Patient name: Ann Moore MRN [id number]  Date of birth: [birth year]  CC & HPI:  Ann Moore is a 24 y.o. Caucasian female presenting today for report of starting lo loestrin in [place]- had regulated periods- came off of it to see how periods were w/o it and are really heavy- wants to know if she should get back on it or change methods. Also is having terrible time w/ anxiety- has multiple anxiety attacks daily- heart races, feels she can't cope w/ stressors. Unable to calm herself down- just has to pass on it's own. Does feel down at time, not eating or sleeping well. Denies SI/HI. Discussed options- would like to try meds.  Pertinent History Reviewed:  Medical & Surgical Hx:   Past Medical History  Diagnosis Date  . Medical history non-contributory   . Missed abortion 03/15/2014   Past Surgical History  Procedure Laterality Date  . No past surgeries    . Dilation and curettage of uterus     Medications: Reviewed & Updated - see associated section Social History: Reviewed -  reports that she has been smoking Cigarettes.  She has been smoking about 0.25 packs per day. She has never used smokeless tobacco.  Objective Findings:  Vitals: BP 122/62 mmHg  Pulse 76  Wt 145 lb (65.772 kg)  LMP 09/27/2013  Physical Examination: General appearance - alert, well appearing, and in no distress  No results found for this or any previous visit (from the past 24 hour(s)).   Assessment & Plan:  A:   Contraception counseling  Depression/anxiety P:  Resume LoLoestrin as directed  Rx zoloft 25mg  daily, knows can take 3-4wks to see results   Offered counseling- declines at this time   F/U 4wks for f/u on meds   Marge Duncans CNM, [facility] 11/11/2014 12:39 PM

## 2014-11-11 NOTE — Patient Instructions (Signed)
Begin taking your birth control pills again Begin zoloft  Depression Depression refers to feeling sad, low, down in the dumps, blue, gloomy, or empty. In general, there are two kinds of depression:  Normal sadness or normal grief. This kind of depression is one that we all feel from time to time after upsetting life experiences, such as the loss of a job or the ending of a relationship. This kind of depression is considered normal, is short lived, and resolves within a few days to 2 weeks. Depression experienced after the loss of a loved one (bereavement) often lasts longer than 2 weeks but normally gets better with time.  Clinical depression. This kind of depression lasts longer than normal sadness or normal grief or interferes with your ability to function at home, at work, and in school. It also interferes with your personal relationships. It affects almost every aspect of your life. Clinical depression is an illness. Symptoms of depression can also be caused by conditions other than those mentioned above, such as:  Physical illness. Some physical illnesses, including underactive thyroid gland (hypothyroidism), severe anemia, specific types of cancer, diabetes, uncontrolled seizures, heart and lung problems, strokes, and chronic pain are commonly associated with symptoms of depression.  Side effects of some prescription medicine. In some people, certain types of medicine can cause symptoms of depression.  Substance abuse. Abuse of alcohol and illicit drugs can cause symptoms of depression. SYMPTOMS Symptoms of normal sadness and normal grief include the following:  Feeling sad or crying for short periods of time.  Not caring about anything (apathy).  Difficulty sleeping or sleeping too much.  No longer able to enjoy the things you used to enjoy.  Desire to be by oneself all the time (social isolation).  Lack of energy or motivation.  Difficulty concentrating or  remembering.  Change in appetite or weight.  Restlessness or agitation. Symptoms of clinical depression include the same symptoms of normal sadness or normal grief and also the following symptoms:  Feeling sad or crying all the time.  Feelings of guilt or worthlessness.  Feelings of hopelessness or helplessness.  Thoughts of suicide or the desire to harm yourself (suicidal ideation).  Loss of touch with reality (psychotic symptoms). Seeing or hearing things that are not real (hallucinations) or having false beliefs about your life or the people around you (delusions and paranoia). DIAGNOSIS  The diagnosis of clinical depression is usually based on how bad the symptoms are and how long they have lasted. Your health care provider will also ask you questions about your medical history and substance use to find out if physical illness, use of prescription medicine, or substance abuse is causing your depression. Your health care provider may also order blood tests. TREATMENT  Often, normal sadness and normal grief do not require treatment. However, sometimes antidepressant medicine is given for bereavement to ease the depressive symptoms until they resolve. The treatment for clinical depression depends on how bad the symptoms are but often includes antidepressant medicine, counseling with a mental health professional, or both. Your health care provider will help to determine what treatment is best for you. Depression caused by physical illness usually goes away with appropriate medical treatment of the illness. If prescription medicine is causing depression, talk with your health care provider about stopping the medicine, decreasing the dose, or changing to another medicine. Depression caused by the abuse of alcohol or illicit drugs goes away when you stop using these substances. Some adults need professional help in  order to stop drinking or using drugs. SEEK IMMEDIATE MEDICAL CARE IF:  You have  thoughts about hurting yourself or others.  You lose touch with reality (have psychotic symptoms).  You are taking medicine for depression and have a serious side effect. FOR MORE INFORMATION  National Alliance on Mental Illness: www.nami.[employer] of Mental Health: http://www.maynard.net/ Document Released: 05/12/2000 Document Revised: 09/29/2013 Document Reviewed: 08/14/2011 [facility] Patient Information 2015 [place], [place]. This information is not intended to replace advice given to you by your health care provider. Make sure you discuss any questions you have with your health care provider.  Generalized Anxiety Disorder Generalized anxiety disorder (GAD) is a mental disorder. It interferes with life functions, including relationships, work, and school. GAD is different from normal anxiety, which everyone experiences at some point in their lives in response to specific life events and activities. Normal anxiety actually helps [place] prepare for and get through these life events and activities. Normal anxiety goes away after the event or activity is over.  GAD causes anxiety that is not necessarily related to specific events or activities. It also causes excess anxiety in proportion to specific events or activities. The anxiety associated with GAD is also difficult to control. GAD can vary from mild to severe. People with severe GAD can have intense waves of anxiety with physical symptoms (panic attacks).  SYMPTOMS The anxiety and worry associated with GAD are difficult to control. This anxiety and worry are related to many life events and activities and also occur more days than not for 6 months or longer. People with GAD also have three or more of the following symptoms (one or more in children):  Restlessness.   Fatigue.  Difficulty concentrating.   Irritability.  Muscle tension.  Difficulty sleeping or unsatisfying sleep. DIAGNOSIS GAD is diagnosed through an assessment  by your health care provider. Your health care provider will ask you questions aboutyour mood,physical symptoms, and events in your life. Your health care provider may ask you about your medical history and use of alcohol or drugs, including prescription medicines. Your health care provider may also do a physical exam and blood tests. Certain medical conditions and the use of certain substances can cause symptoms similar to those associated with GAD. Your health care provider may refer you to a mental health specialist for further evaluation. TREATMENT The following therapies are usually used to treat GAD:   Medication. Antidepressant medication usually is prescribed for long-term daily control. Antianxiety medicines may be added in severe cases, especially when panic attacks occur.   Talk therapy (psychotherapy). Certain types of talk therapy can be helpful in treating GAD by providing support, education, and guidance. A form of talk therapy called cognitive behavioral therapy can teach you healthy ways to think about and react to daily life events and activities.  Stress managementtechniques. These include yoga, meditation, and exercise and can be very helpful when they are practiced regularly. A mental health specialist can help determine which treatment is best for you. Some people see improvement with one therapy. However, other people require a combination of therapies. Document Released: 09/09/2012 Document Revised: 09/29/2013 Document Reviewed: 09/09/2012 [facility] Patient Information 2015 [place], [place]. This information is not intended to replace advice given to you by your health care provider. Make sure you discuss any questions you have with your health care provider.

## 2014-12-09 ENCOUNTER — Encounter: Payer: Self-pay | Admitting: Women's Health

## 2014-12-09 ENCOUNTER — Ambulatory Visit (INDEPENDENT_AMBULATORY_CARE_PROVIDER_SITE_OTHER): Payer: Medicaid Other | Admitting: Women's Health

## 2014-12-09 VITALS — BP 130/88 | HR 107 | Ht 63.0 in | Wt 149.0 lb

## 2014-12-09 DIAGNOSIS — F4310 Post-traumatic stress disorder, unspecified: Secondary | ICD-10-CM | POA: Insufficient documentation

## 2014-12-09 DIAGNOSIS — F418 Other specified anxiety disorders: Secondary | ICD-10-CM | POA: Insufficient documentation

## 2014-12-09 DIAGNOSIS — Z3041 Encounter for surveillance of contraceptive pills: Secondary | ICD-10-CM | POA: Diagnosis not present

## 2014-12-09 DIAGNOSIS — F411 Generalized anxiety disorder: Secondary | ICD-10-CM

## 2014-12-09 MED ORDER — ESCITALOPRAM OXALATE 10 MG PO TABS: 10.0000 mg | ORAL_TABLET | Freq: Every day | ORAL | Status: DC

## 2014-12-09 NOTE — Patient Instructions (Signed)
Escitalopram tablets  What is this medicine?  ESCITALOPRAM (es sye TAL oh pram) is used to treat depression and certain types of anxiety.  This medicine may be used for other purposes; ask your health care provider or pharmacist if you have questions.  COMMON BRAND NAME(S): Lexapro  What should I tell my health care provider before I take this medicine?  They need to know if you have any of these conditions:  -bipolar disorder or a family history of bipolar disorder  -diabetes  -glaucoma  -heart disease  -kidney or liver disease  -receiving electroconvulsive therapy  -seizures (convulsions)  -suicidal thoughts, plans, or attempt by you or a family member  -an unusual or allergic reaction to escitalopram, the related drug citalopram, other medicines, foods, dyes, or preservatives  -pregnant or trying to become pregnant  -breast-feeding  How should I use this medicine?  Take this medicine by mouth with a glass of water. Follow the directions on the prescription label. You can take it with or without food. If it upsets your stomach, take it with food. Take your medicine at regular intervals. Do not take it more often than directed. Do not stop taking this medicine suddenly except upon the advice of your doctor. Stopping this medicine too quickly may cause serious side effects or your condition may worsen.  A special MedGuide will be given to you by the pharmacist with each prescription and refill. Be sure to read this information carefully each time.  Talk to your pediatrician regarding the use of this medicine in children. Special care may be needed.  Overdosage: If you think you have taken too much of this medicine contact a poison control center or emergency room at once.  NOTE: This medicine is only for you. Do not share this medicine with others.  What if I miss a dose?  If you miss a dose, take it as soon as you can. If it is almost time for your next dose, take only that dose. Do not take double or extra  doses.  What may interact with this medicine?  Do not take this medicine with any of the following medications:  -certain medicines for fungal infections like fluconazole, itraconazole, ketoconazole, posaconazole, voriconazole  -cisapride  -citalopram  -dofetilide  -dronedarone  -linezolid  -MAOIs like Carbex, Eldepryl, Marplan, Nardil, and Parnate  -methylene blue (injected into a vein)  -pimozide  -thioridazine  -ziprasidone  This medicine may also interact with the following medications:  -alcohol  -aspirin and aspirin-like medicines  -carbamazepine  -certain medicines for depression, anxiety, or psychotic disturbances  -certain medicines for migraine headache like almotriptan, eletriptan, frovatriptan, naratriptan, rizatriptan, sumatriptan, zolmitriptan  -certain medicines for sleep  -certain medicines that treat or prevent blood clots like warfarin, enoxaparin, dalteparin  -cimetidine  -diuretics  -fentanyl  -furazolidone  -isoniazid  -lithium  -metoprolol  -NSAIDs, medicines for pain and inflammation, like ibuprofen or naproxen  -other medicines that prolong the QT interval (cause an abnormal heart rhythm)  -procarbazine  -rasagiline  -supplements like St. John's wort, kava kava, valerian  -tramadol  -tryptophan  This list may not describe all possible interactions. Give your health care provider a list of all the medicines, herbs, non-prescription drugs, or dietary supplements you use. Also tell them if you smoke, drink alcohol, or use illegal drugs. Some items may interact with your medicine.  What should I watch for while using this medicine?  Tell your doctor if your symptoms do not get better or if they   get worse. Visit your doctor or health care professional for regular checks on your progress. Because it may take several weeks to see the full effects of this medicine, it is important to continue your treatment as prescribed by your doctor.  Patients and their families should watch out for new or  worsening thoughts of suicide or depression. Also watch out for sudden changes in feelings such as feeling anxious, agitated, panicky, irritable, hostile, aggressive, impulsive, severely restless, overly excited and hyperactive, or not being able to sleep. If this happens, especially at the beginning of treatment or after a change in dose, call your health care professional.  You may get drowsy or dizzy. Do not drive, use machinery, or do anything that needs mental alertness until you know how this medicine affects you. Do not stand or sit up quickly, especially if you are an older patient. This reduces the risk of dizzy or fainting spells. Alcohol may interfere with the effect of this medicine. Avoid alcoholic drinks.  Your mouth may get dry. Chewing sugarless gum or sucking hard candy, and drinking plenty of water may help. Contact your doctor if the problem does not go away or is severe.  What side effects may I notice from receiving this medicine?  Side effects that you should report to your doctor or health care professional as soon as possible:  -allergic reactions like skin rash, itching or hives, swelling of the face, lips, or tongue  -confusion  -feeling faint or lightheaded, falls  -fast talking and excited feelings or actions that are out of control  -hallucination, loss of contact with reality  -seizures  -suicidal thoughts or other mood changes  -unusual bleeding or bruising  Side effects that usually do not require medical attention (report to your doctor or health care professional if they continue or are bothersome):  -blurred vision  -changes in appetite  -change in sex drive or performance  -headache  -increased sweating  -nausea  This list may not describe all possible side effects. Call your doctor for medical advice about side effects. You may report side effects to FDA at 1-800-FDA-1088.  Where should I keep my medicine?  Keep out of reach of children.  Store at room temperature between 15 and  30 degrees C (59 and 86 degrees F). Throw away any unused medicine after the expiration date.  NOTE: This sheet is a summary. It may not cover all possible information. If you have questions about this medicine, talk to your doctor, pharmacist, or health care provider.  © 2015, Elsevier/Gold Standard. (2012-12-10 12:32:55)

## 2014-12-09 NOTE — Progress Notes (Signed)
Patient ID: Ann Moore, female   DOB: 09/15/1990, 24 y.o.   MRN: [id number]   [facility]  Patient name: Ann Moore MRN [id number]  Date of birth: 04/23/1991  CC & HPI:  Ann Moore is a 24 y.o. Caucasian female presenting today for 4wk f/u from zoloft 25mg  daily for anxiety. States she stopped it b/c it made her feel 'foggy' and like she was 'floating in the clouds'. Anxiety is still high, no depression. Denies SI/HI/II. Wants to try a different medicine. Doing well on Lo Loestrin, no period w/ 1st pack, is on 2nd pack. States this is normal for her.   Pertinent History Reviewed:  Medical & Surgical Hx:   Past Medical History  Diagnosis Date  . Medical history non-contributory   . Missed abortion 03/15/2014   Past Surgical History  Procedure Laterality Date  . No past surgeries    . Dilation and curettage of uterus     Medications: Reviewed & Updated - see associated section Social History: Reviewed -  reports that she has been smoking Cigarettes.  She has been smoking about 0.25 packs per day. She has never used smokeless tobacco.  Objective Findings:  Vitals: BP 130/88 mmHg  Pulse 107  Ht 5\' 3"  (1.6 m)  Wt 149 lb (67.586 kg)  BMI 26.40 kg/m2  LMP 09/28/2014  Physical Examination: General appearance - alert, well appearing, and in no distress  UPT: neg  Assessment & Plan:  A:   Generalized anxiety  Contraception follow-up P:  Lexapro 10mg  daily w/ 6RF, knows can take 3-4wks for results   Continue Lo Loestrin daily  F/U 4wks for f/u   Marge DuncansBooker, Kyndra Condron Randall CNM, [facility] 12/09/2014 10:25 AM

## 2015-04-28 ENCOUNTER — Telehealth: Payer: Self-pay | Admitting: *Deleted

## 2015-04-28 NOTE — Telephone Encounter (Signed)
Pt requesting medication for UTI or yeast infection. Pt informed would need an appt. Appt scheduled for 04/29/2015 for evaluation.

## 2015-04-29 ENCOUNTER — Encounter: Payer: Self-pay | Admitting: Advanced Practice Midwife

## 2015-04-29 ENCOUNTER — Ambulatory Visit (INDEPENDENT_AMBULATORY_CARE_PROVIDER_SITE_OTHER): Payer: Medicaid Other | Admitting: Advanced Practice Midwife

## 2015-04-29 VITALS — BP 120/78 | HR 94 | Ht 63.0 in | Wt 146.0 lb

## 2015-04-29 DIAGNOSIS — N898 Other specified noninflammatory disorders of vagina: Secondary | ICD-10-CM

## 2015-04-29 DIAGNOSIS — Z113 Encounter for screening for infections with a predominantly sexual mode of transmission: Secondary | ICD-10-CM | POA: Diagnosis not present

## 2015-04-29 DIAGNOSIS — A5901 Trichomonal vulvovaginitis: Secondary | ICD-10-CM

## 2015-04-29 DIAGNOSIS — L292 Pruritus vulvae: Secondary | ICD-10-CM

## 2015-04-29 HISTORY — DX: Trichomonal vulvovaginitis: A59.01

## 2015-04-29 MED ORDER — METRONIDAZOLE 500 MG PO TABS: 2000.0000 mg | ORAL_TABLET | Freq: Once | ORAL | Status: DC

## 2015-04-29 NOTE — Patient Instructions (Signed)
Trichomoniasis Trichomoniasis is an infection caused by an organism called Trichomonas. The infection can affect both women and men. In women, the outer female genitalia and the vagina are affected. In men, the penis is mainly affected, but the prostate and other reproductive organs can also be involved. Trichomoniasis is a sexually transmitted infection (STI) and is most often passed to another person through sexual contact.  RISK FACTORS  Having unprotected sexual intercourse.  Having sexual intercourse with an infected partner. SIGNS AND SYMPTOMS  Symptoms of trichomoniasis in women include:  Abnormal gray-green frothy vaginal discharge.  Itching and irritation of the vagina.  Itching and irritation of the area outside the vagina. Symptoms of trichomoniasis in men include:   Penile discharge with or without pain.  Pain during urination. This results from inflammation of the urethra. DIAGNOSIS  Trichomoniasis may be found during a Pap test or physical exam. Your health care provider may use one of the following methods to help diagnose this infection:  Testing the pH of the vagina with a test tape.  Using a vaginal swab test that checks for the Trichomonas organism. A test is available that provides results within a few minutes.  Examining a urine sample.  Testing vaginal secretions. Your health care provider may test you for other STIs, including HIV. TREATMENT   You may be given medicine to fight the infection. Women should inform their health care provider if they could be or are pregnant. Some medicines used to treat the infection should not be taken during pregnancy.  Your health care provider may recommend over-the-counter medicines or creams to decrease itching or irritation.  Your sexual partner will need to be treated if infected.  Your health care provider may test you for infection again 3 months after treatment. HOME CARE INSTRUCTIONS   Take medicines only as  directed by your health care provider.  Take over-the-counter medicine for itching or irritation as directed by your health care provider.  Do not have sexual intercourse while you have the infection.  Women should not douche or wear tampons while they have the infection.  Discuss your infection with your partner. Your partner may have gotten the infection from you, or you may have gotten it from your partner.  Have your sex partner get examined and treated if necessary.  Practice safe, informed, and protected sex.  See your health care provider for other STI testing. SEEK MEDICAL CARE IF:   You still have symptoms after you finish your medicine.  You develop abdominal pain.  You have pain when you urinate.  You have bleeding after sexual intercourse.  You develop a rash.  Your medicine makes you sick or makes you throw up (vomit). MAKE SURE YOU:  Understand these instructions.  Will watch your condition.  Will get help right away if you are not doing well or get worse.   This information is not intended to replace advice given to you by your health care provider. Make sure you discuss any questions you have with your health care provider.   Document Released: 11/08/2000 Document Revised: 06/05/2014 Document Reviewed: 02/24/2013 Elsevier Interactive Patient Education 2016 [employer]  

## 2015-04-29 NOTE — Progress Notes (Signed)
  CHIEF COMPLAINT/HPI:  24 y.o. female complains of white vaginal discharge for 7 day(s). She also c/o vaginal itching  Denies abnormal vaginal bleeding, significant pelvic pain or fever. No UTI symptoms. Sexually active, does not use condoms, Takes COC's daily. Does not have a steady partner.  Last unprotected intercourse about a month ago.  Denies history of known exposure to STD or symptoms in partner.  Patient's last menstrual period was 04/22/2015.  No history of STD's.  Review of Systems  Constitutional: Negative for fever and chills Eyes: Negative for visual disturbances Respiratory: Negative for shortness of breath, dyspnea Cardiovascular: Negative for chest pain or palpitations  Gastrointestinal: Negative for vomiting, diarrhea and constipation Genitourinary: Negative for dysuria and urgency Musculoskeletal: Negative for back pain, joint pain, myalgias  Neurological: Negative for dizziness and headaches    Past Medical History: Past Medical History  Diagnosis Date  . Medical history non-contributory   . Missed abortion 03/15/2014    Past Surgical History: Past Surgical History  Procedure Laterality Date  . No past surgeries    . Dilation and curettage of uterus      Obstetrical History: OB History    Gravida Para Term Preterm AB TAB SAB Ectopic Multiple Living   2 1 1  0 1 0 1 0 0 1       Social History: Social History   Social History  . Marital Status: Single    Spouse Name: N/A  . Number of Children: N/A  . Years of Education: N/A   Social History Main Topics  . Smoking status: Current Every Day Smoker -- 0.25 packs/day    Types: Cigarettes  . Smokeless tobacco: Never Used  . Alcohol Use: No  . Drug Use: No     Comment: denies use 03/31/14  . Sexual Activity: Not Currently    Birth Control/ Protection: Pill   Other Topics Concern  . None   Social History Narrative    Family History: Family History  Problem Relation Age of Onset  . Hypertension  Mother   . Diabetes Sister   . Diabetes Maternal Grandfather   . Cancer Maternal Grandfather     liver, lung    Allergies: Allergies  Allergen Reactions  . Hydrocodone Nausea And Vomiting and Rash     Physical Examination: General appearance - well appearing, and in no distress Mental status - alert, oriented to person, place, and time Chest:  Normal respiratory effort Heart - normal rate and regular rhythm Abdomen:  Soft, nontender Pelvic: Vulva red, SSE:  Frothy white discharge with no odor.  Wet prep neg yeast, neg clue, TNTC WBC, positive trich. Musculoskeletal:  Normal range of motion without pain Extremities:  No edema       Labs: No results found for this or any previous visit (from the past 24 hour(s)).   Assessment: Patient Active Problem List   Diagnosis Date Noted  . Generalized anxiety disorder 12/09/2014  . Miscarriage 03/23/2014  . Rubella non-immune status, antepartum 03/10/2014    Plan:  Orders Placed This Encounter  Procedures  . GC/Chlamydia Probe Amp  . HIV antibody   Treat trich with 2gm Flagyl PO Encouraged condom use POC 3 weeks  CRESENZO-DISHMAN,Geralda Baumgardner 04/29/2015 2:53 PM

## 2015-04-30 LAB — HIV ANTIBODY (ROUTINE TESTING W REFLEX): HIV SCREEN 4TH GENERATION: NONREACTIVE

## 2015-05-03 ENCOUNTER — Ambulatory Visit: Payer: Medicaid Other | Admitting: Women's Health

## 2015-05-19 ENCOUNTER — Encounter: Payer: Self-pay | Admitting: Women's Health

## 2015-05-19 ENCOUNTER — Ambulatory Visit: Payer: Medicaid Other | Admitting: Women's Health

## 2015-05-30 NOTE — L&D Delivery Note (Signed)
25 y.o. G3P1011 at 4556w3d delivered a viable female infant in cephalic, ROA position. No nuchal cord. Left anterior shoulder delivered with ease. Baby was vigorous at delivery. 60 sec delayed cord clamping. Cord clamped x2 and cut. Delivery performed by Dr. Omer Moore.  Cord avulsion at placental insertion, manual removal of parts of placenta, followed by sharp curettage with [place] until uterine cri, was performed by Dr. Nettie ElmMichael Moore. Fundus firm on exam with massage and pitocin. Good hemostasis noted.  Laceration: None Suture: N/A Good hemostasis noted.  Mom and baby recovering in LDR.    Apgars:  8/9 Weight:  Pending, skin to skin EBL: 200 cc   Ann MowElizabeth Mumaw, DO [facility] Fellow [facility] 02/20/2016, 2:35 AM

## 2015-06-13 ENCOUNTER — Encounter (HOSPITAL_COMMUNITY): Payer: Self-pay | Admitting: Emergency Medicine

## 2015-06-13 ENCOUNTER — Emergency Department (HOSPITAL_COMMUNITY)
Admission: EM | Admit: 2015-06-13 | Discharge: 2015-06-13 | Disposition: A | Discharge status: Home / Self Care | Payer: Medicaid Other | Attending: Emergency Medicine | Admitting: Emergency Medicine

## 2015-06-13 DIAGNOSIS — R112 Nausea with vomiting, unspecified: Secondary | ICD-10-CM | POA: Insufficient documentation

## 2015-06-13 DIAGNOSIS — R197 Diarrhea, unspecified: Secondary | ICD-10-CM | POA: Diagnosis not present

## 2015-06-13 DIAGNOSIS — F17210 Nicotine dependence, cigarettes, uncomplicated: Secondary | ICD-10-CM | POA: Diagnosis not present

## 2015-06-13 DIAGNOSIS — R1033 Periumbilical pain: Secondary | ICD-10-CM | POA: Diagnosis present

## 2015-06-13 DIAGNOSIS — Z793 Long term (current) use of hormonal contraceptives: Secondary | ICD-10-CM | POA: Insufficient documentation

## 2015-06-13 LAB — COMPREHENSIVE METABOLIC PANEL
ALT: 19 U/L (ref 14–54)
AST: 21 U/L (ref 15–41)
Albumin: 4.9 g/dL (ref 3.5–5.0)
Alkaline Phosphatase: 73 U/L (ref 38–126)
Anion gap: 8 (ref 5–15)
BUN: 12 mg/dL (ref 6–20)
CHLORIDE: 106 mmol/L (ref 101–111)
CO2: 26 mmol/L (ref 22–32)
CREATININE: 0.83 mg/dL (ref 0.44–1.00)
Calcium: 9.9 mg/dL (ref 8.9–10.3)
GFR calc Af Amer: >60 mL/min (ref >60)
Glucose, Bld: 138 mg/dL — ABNORMAL HIGH (ref 65–99)
Potassium: 4.1 mmol/L (ref 3.5–5.1)
Sodium: 140 mmol/L (ref 135–145)
Total Bilirubin: 1.2 mg/dL (ref 0.3–1.2)
Total Protein: 8.1 g/dL (ref 6.5–8.1)

## 2015-06-13 LAB — CBC WITH DIFFERENTIAL/PLATELET
Basophils Absolute: 0 10*3/uL (ref 0.0–0.1)
Basophils Relative: 0 %
EOS PCT: 0 %
Eosinophils Absolute: 0 10*3/uL (ref 0.0–0.7)
HCT: 46.8 % — ABNORMAL HIGH (ref 36.0–46.0)
Hemoglobin: 15.3 g/dL — ABNORMAL HIGH (ref 12.0–15.0)
Lymphocytes Relative: 8 %
Lymphs Abs: 1 10*3/uL (ref 0.7–4.0)
MCH: 28.2 pg (ref 26.0–34.0)
MCHC: 32.7 g/dL (ref 30.0–36.0)
MCV: 86.3 fL (ref 78.0–100.0)
Monocytes Absolute: 0.5 10*3/uL (ref 0.1–1.0)
Monocytes Relative: 5 %
NEUTROS ABS: 10 10*3/uL — AB (ref 1.7–7.7)
Neutrophils Relative %: 87 %
PLATELETS: 197 10*3/uL (ref 150–400)
RBC: 5.42 MIL/uL — ABNORMAL HIGH (ref 3.87–5.11)
RDW: 14 % (ref 11.5–15.5)
WBC: 11.5 10*3/uL — ABNORMAL HIGH (ref 4.0–10.5)

## 2015-06-13 MED ORDER — KETOROLAC TROMETHAMINE 30 MG/ML IJ SOLN
30.0000 mg | Freq: Once | INTRAMUSCULAR | Status: AC
  Administered 2015-06-13: 30 mg via INTRAVENOUS
  Filled 2015-06-13: qty 1

## 2015-06-13 MED ORDER — ONDANSETRON HCL 4 MG/2ML IJ SOLN
4.0000 mg | Freq: Once | INTRAMUSCULAR | Status: AC
  Administered 2015-06-13: 4 mg via INTRAVENOUS
  Filled 2015-06-13: qty 2

## 2015-06-13 MED ORDER — ONDANSETRON 4 MG PO TBDP: 4.0000 mg | ORAL_TABLET | Freq: Three times a day (TID) | ORAL | Status: DC | PRN

## 2015-06-13 MED ORDER — SODIUM CHLORIDE 0.9 % IV BOLUS (SEPSIS)
1000.0000 mL | Freq: Once | INTRAVENOUS | Status: AC
  Administered 2015-06-13: 1000 mL via INTRAVENOUS

## 2015-06-13 NOTE — Discharge Instructions (Signed)
Zofran for nausea Drink plenty of fluids REturn to ER if worsening  Please obtain all of your results from medical records or have your doctors office obtain the results - share them with your doctor - you should be seen at your doctors office in the next 2 days. Call today to arrange your follow up. Take the medications as prescribed. Please review all of the medicines and only take them if you do not have an allergy to them. Please be aware that if you are taking birth control pills, taking other prescriptions, ESPECIALLY ANTIBIOTICS may make the birth control ineffective - if this is the case, either do not engage in sexual activity or use alternative methods of birth control such as condoms until you have finished the medicine and your family doctor says it is OK to restart them. If you are on a blood thinner such as COUMADIN, be aware that any other medicine that you take may cause the coumadin to either work too much, or not enough - you should have your coumadin level rechecked in next 7 days if this is the case.  ?  It is also a possibility that you have an allergic reaction to any of the medicines that you have been prescribed - Everybody reacts differently to medications and while MOST people have no trouble with most medicines, you may have a reaction such as nausea, vomiting, rash, swelling, shortness of breath. If this is the case, please stop taking the medicine immediately and contact your physician.  ?  You should return to the ER if you develop severe or worsening symptoms.

## 2015-06-13 NOTE — ED Notes (Signed)
Pt unable to provide urine sample at this time. Pt states she needs to leave.

## 2015-06-13 NOTE — ED Notes (Signed)
Pt requesting to leave. Pt reports needing to pick up her son. MD Hyacinth MeekerMiller notified. MD requesting urine preg. Before d/c.

## 2015-06-13 NOTE — ED Notes (Signed)
Patient c/o mid abd pain with nausea, vomiting, and diarrhea that started last night. Unsure of fevers but reports chills.

## 2015-06-13 NOTE — ED Provider Notes (Signed)
CSN: [id number]     Arrival date & time 06/13/15  1232 History   First MD Initiated Contact with Patient 06/13/15 1355     Chief Complaint  Patient presents with  . Abdominal Pain     (Consider location/radiation/quality/duration/timing/severity/associated sxs/prior Treatment) HPI  The patient is a 25 year old female, she has no significant prior medical history, denies being pregnant, had a sick child last week with nausea vomiting and diarrhea, she reports that last night at approximately 11:00 PM she developed minimal abdominal cramping in the periumbilical area followed by multiple episodes of watery diarrhea and vomiting throughout the night. She has been unable to hold down fluids, states that she has minimal abdominal pain, no dysuria in fact she has not made much urine today. She denies fevers, think she may have had some chills. No coughing shortness of breath chest pain back pain headache or swelling of the legs.   Past Medical History  Diagnosis Date  . Medical history non-contributory   . Missed abortion 03/15/2014   Past Surgical History  Procedure Laterality Date  . No past surgeries    . Dilation and curettage of uterus     Family History  Problem Relation Age of Onset  . Hypertension Mother   . Diabetes Sister   . Diabetes Maternal Grandfather   . Cancer Maternal Grandfather     liver, lung   Social History  Substance Use Topics  . Smoking status: Current Every Day Smoker -- 0.25 packs/day    Types: Cigarettes  . Smokeless tobacco: Never Used  . Alcohol Use: No   OB History    Gravida Para Term Preterm AB TAB SAB Ectopic Multiple Living   2 1 1  0 1 0 1 0 0 1     Review of Systems  All other systems reviewed and are negative.     Allergies  Hydrocodone  Home Medications   Prior to Admission medications   Medication Sig Start Date End Date Taking? Authorizing Provider  Norethindrone-Ethinyl Estradiol-Fe Biphas (LO LOESTRIN FE) 1 MG-10 MCG / 10  MCG tablet Take 1 tablet by mouth daily. 06/09/14  Yes Cheral Marker, CNM  escitalopram (LEXAPRO) 10 MG tablet Take 1 tablet (10 mg total) by mouth daily. Patient not taking: Reported on 04/29/2015 12/09/14   Cheral Marker, CNM  metroNIDAZOLE (FLAGYL) 500 MG tablet Take 4 tablets (2,000 mg total) by mouth once. 04/29/15   Jacklyn Shell, CNM  ondansetron (ZOFRAN ODT) 4 MG disintegrating tablet Take 1 tablet (4 mg total) by mouth every 8 (eight) hours as needed for nausea. 06/13/15   Eber Hong, MD   BP 98/61 mmHg  Pulse 72  Temp(Src) 98.5 F (36.9 C) (Oral)  Resp 16  Ht 5\' 4"  (1.626 m)  Wt 145 lb (65.772 kg)  BMI 24.88 kg/m2  SpO2 97%  LMP 05/30/2015 Physical Exam  Constitutional: She appears well-developed and well-nourished. No distress.  HENT:  Head: Normocephalic and atraumatic.  Mouth/Throat: Oropharynx is clear and moist. No oropharyngeal exudate.  Eyes: Conjunctivae and EOM are normal. Pupils are equal, round, and reactive to light. Right eye exhibits no discharge. Left eye exhibits no discharge. No scleral icterus.  Neck: Normal range of motion. Neck supple. No JVD present. No thyromegaly present.  Cardiovascular: Normal rate, regular rhythm, normal heart sounds and intact distal pulses.  Exam reveals no gallop and no friction rub.   No murmur heard. Pulmonary/Chest: Effort normal and breath sounds normal. No respiratory distress. She has no  wheezes. She has no rales.  Abdominal: Soft. Bowel sounds are normal. She exhibits no distension and no mass. There is tenderness (minimal periumbilical tenderness, no pain in McBurney's point, no Murphy sign).  Overall the abdomen is benign without guarding peritoneal signs or tympanitic sounds to percussion  Musculoskeletal: Normal range of motion. She exhibits no edema or tenderness.  Lymphadenopathy:    She has no cervical adenopathy.  Neurological: She is alert. Coordination normal.  Skin: Skin is warm and dry. No rash  noted. No erythema.  Psychiatric: She has a normal mood and affect. Her behavior is normal.  Nursing note and vitals reviewed.   ED Course  Procedures (including critical care time) Labs Review Labs Reviewed  CBC WITH DIFFERENTIAL/PLATELET - Abnormal; Notable for the following:    WBC 11.5 (*)    RBC 5.42 (*)    Hemoglobin 15.3 (*)    HCT 46.8 (*)    Neutro Abs 10.0 (*)    All other components within normal limits  COMPREHENSIVE METABOLIC PANEL - Abnormal; Notable for the following:    Glucose, Bld 138 (*)    All other components within normal limits  URINALYSIS, ROUTINE W REFLEX MICROSCOPIC (NOT AT [facility])  POC URINE PREG, ED    Imaging Review No results found. I have personally reviewed and evaluated these images and lab results as part of my medical decision-making.    MDM   Final diagnoses:  Nausea vomiting and diarrhea    Likely gastroenteritis - she will need IVF, zofran, check UA and preg - labs ordered by RN prior to my eval show slight leukocytosis as expected given sx - CMP WNL other than mild hyperglycemia.  Pt in agreement to plan of IVF, meds, UA and preg.  Pt refuses urine sample - insistent on leaving without testing as she needs to pick her child up - no more v/d since arrival.  Filed Vitals:   06/13/15 1239 06/13/15 1503  BP: 133/85 98/61  Pulse: 111 72  Temp: 98.5 F (36.9 C)   TempSrc: Oral   Resp: 18 16  Height: 5\' 4"  (1.626 m)   Weight: 145 lb (65.772 kg)   SpO2: 98% 97%    Meds given in ED:  Medications  ketorolac (TORADOL) 30 MG/ML injection 30 mg (30 mg Intravenous Given 06/13/15 1423)  ondansetron (ZOFRAN) injection 4 mg (4 mg Intravenous Given 06/13/15 1423)  sodium chloride 0.9 % bolus 1,000 mL (1,000 mLs Intravenous New Bag/Given 06/13/15 1423)    New Prescriptions   ONDANSETRON (ZOFRAN ODT) 4 MG DISINTEGRATING TABLET    Take 1 tablet (4 mg total) by mouth every 8 (eight) hours as needed for nausea.      Eber HongBrian Kaelani Kendrick,  MD 06/13/15 [phone]

## 2015-06-13 NOTE — ED Notes (Signed)
Pt reminded that a urine specimen is needed. Pt verbalized understanding and will notify staff when one can be obtained.

## 2015-08-20 ENCOUNTER — Encounter: Payer: Self-pay | Admitting: Adult Health

## 2015-08-20 ENCOUNTER — Ambulatory Visit (INDEPENDENT_AMBULATORY_CARE_PROVIDER_SITE_OTHER): Payer: Medicaid Other | Admitting: Adult Health

## 2015-08-20 VITALS — BP 130/86 | HR 104 | Temp 98.3°F | Ht 63.0 in | Wt 143.5 lb

## 2015-08-20 DIAGNOSIS — N898 Other specified noninflammatory disorders of vagina: Secondary | ICD-10-CM | POA: Diagnosis not present

## 2015-08-20 DIAGNOSIS — Z3201 Encounter for pregnancy test, result positive: Secondary | ICD-10-CM | POA: Diagnosis not present

## 2015-08-20 DIAGNOSIS — N926 Irregular menstruation, unspecified: Secondary | ICD-10-CM | POA: Diagnosis not present

## 2015-08-20 DIAGNOSIS — N390 Urinary tract infection, site not specified: Secondary | ICD-10-CM | POA: Diagnosis not present

## 2015-08-20 DIAGNOSIS — R112 Nausea with vomiting, unspecified: Secondary | ICD-10-CM | POA: Diagnosis not present

## 2015-08-20 DIAGNOSIS — Z3490 Encounter for supervision of normal pregnancy, unspecified, unspecified trimester: Secondary | ICD-10-CM

## 2015-08-20 DIAGNOSIS — O3680X1 Pregnancy with inconclusive fetal viability, fetus 1: Secondary | ICD-10-CM

## 2015-08-20 DIAGNOSIS — O219 Vomiting of pregnancy, unspecified: Secondary | ICD-10-CM

## 2015-08-20 HISTORY — DX: Other specified noninflammatory disorders of vagina: N89.8

## 2015-08-20 HISTORY — DX: Urinary tract infection, site not specified: N39.0

## 2015-08-20 HISTORY — DX: Encounter for supervision of normal pregnancy, unspecified, unspecified trimester: Z34.90

## 2015-08-20 LAB — POCT URINALYSIS DIPSTICK
Glucose, UA: NEGATIVE
Nitrite, UA: POSITIVE

## 2015-08-20 LAB — POCT WET PREP (WET MOUNT)
CLUE CELLS WET PREP WHIFF POC: NEGATIVE
WBC, Wet Prep HPF POC: POSITIVE

## 2015-08-20 LAB — POCT URINE PREGNANCY: Preg Test, Ur: POSITIVE — AB

## 2015-08-20 MED ORDER — NITROFURANTOIN MONOHYD MACRO 100 MG PO CAPS: 100.0000 mg | ORAL_CAPSULE | Freq: Two times a day (BID) | ORAL | Status: DC

## 2015-08-20 MED ORDER — PROMETHAZINE HCL 25 MG PO TABS: 25.0000 mg | ORAL_TABLET | Freq: Four times a day (QID) | ORAL | Status: DC | PRN

## 2015-08-20 MED ORDER — PRENATAL PLUS 27-1 MG PO TABS: 1.0000 | ORAL_TABLET | Freq: Every day | ORAL | Status: DC

## 2015-08-20 NOTE — Progress Notes (Signed)
Subjective:     Patient ID: Ann Moore, female   DOB: 12/16/1990, 25 y.o.   MRN: [id number]  HPI Ann Moore is a 25 year old white female, in for missed period, strong odor in urine and vaginal discharge with some itching and nausea and vomiting. Has had trich in December.   Review of Systems Patient denies any headaches, hearing loss, fatigue, blurred vision, shortness of breath, chest pain, abdominal pain, problems with bowel movements,  or intercourse. No joint pain or mood swings.See HPI for positives. Reviewed past medical,surgical, social and family history. Reviewed medications and allergies.      Objective:   Physical Exam BP 130/86 mmHg  Pulse 104  Temp(Src) 98.3 F (36.8 C)  Ht 5\' 3"  (1.6 m)  Wt 143 lb 8 oz (65.091 kg)  BMI 25.43 kg/m2  LMP 05/25/2015 (Approximate)  Breastfeeding? No UPT +, about 12+3 weeks by LMP with EDD 02/29/16, +FHM with FR 168 and looks to be 8-10 weeks, medicaid form given. Urine dipstick + nitrates, 2+ leuks, 1+ protein and 1+blood and 2+ ketones Skin warm and dry. Neck: mid line trachea, normal thyroid, good ROM, no lymphadenopathy noted. Lungs: clear to ausculation bilaterally. Cardiovascular: regular rate and rhythm. Pelvic: external genitalia is normal in appearance no lesions, vagina: white discharge without odor,urethra has no lesions or masses noted, cervix:smooth and bulbous, uterus: 8-10 week size, shape and contour, non tender, no masses felt, adnexa: no masses or tenderness noted. Bladder is non tender and no masses felt. Wet prep: +WBCs.     Assessment:     UPT+ Pregnant  UTI Vaginal discharge Nausea and vomiting of pregnancy    Plan:     Rx prenatal plus #30 take 1 daily with 11 refills Rx phenergan 25 mg #30 take 1 every 6 hours prn with 1 refill Rx macrobid 1 bid x 7 days Return in 1 week for dating US Review handout on second trimester Push fluids

## 2015-08-20 NOTE — Patient Instructions (Signed)

## 2015-08-23 LAB — URINE CULTURE

## 2015-08-24 ENCOUNTER — Other Ambulatory Visit: Payer: Self-pay | Admitting: Adult Health

## 2015-08-24 DIAGNOSIS — O3680X0 Pregnancy with inconclusive fetal viability, not applicable or unspecified: Secondary | ICD-10-CM

## 2015-08-26 ENCOUNTER — Ambulatory Visit (INDEPENDENT_AMBULATORY_CARE_PROVIDER_SITE_OTHER): Payer: Medicaid Other

## 2015-08-26 DIAGNOSIS — Z3A10 10 weeks gestation of pregnancy: Secondary | ICD-10-CM

## 2015-08-26 DIAGNOSIS — O3680X0 Pregnancy with inconclusive fetal viability, not applicable or unspecified: Secondary | ICD-10-CM | POA: Diagnosis not present

## 2015-08-26 NOTE — Progress Notes (Signed)
US 10wks,single IUP pos fht 159 bpm,normal ov's bilat,crl 33.407mm

## 2015-09-01 ENCOUNTER — Encounter: Payer: Medicaid Other | Admitting: Women's Health

## 2015-09-14 ENCOUNTER — Encounter: Payer: Self-pay | Admitting: Advanced Practice Midwife

## 2015-09-14 ENCOUNTER — Ambulatory Visit (INDEPENDENT_AMBULATORY_CARE_PROVIDER_SITE_OTHER): Payer: Medicaid Other | Admitting: Advanced Practice Midwife

## 2015-09-14 VITALS — BP 124/74 | HR 100 | Wt 148.5 lb

## 2015-09-14 DIAGNOSIS — Z331 Pregnant state, incidental: Secondary | ICD-10-CM

## 2015-09-14 DIAGNOSIS — Z3490 Encounter for supervision of normal pregnancy, unspecified, unspecified trimester: Secondary | ICD-10-CM | POA: Insufficient documentation

## 2015-09-14 DIAGNOSIS — Z369 Encounter for antenatal screening, unspecified: Secondary | ICD-10-CM

## 2015-09-14 DIAGNOSIS — F411 Generalized anxiety disorder: Secondary | ICD-10-CM

## 2015-09-14 DIAGNOSIS — Z3491 Encounter for supervision of normal pregnancy, unspecified, first trimester: Secondary | ICD-10-CM

## 2015-09-14 DIAGNOSIS — Z3682 Encounter for antenatal screening for nuchal translucency: Secondary | ICD-10-CM

## 2015-09-14 DIAGNOSIS — Z0283 Encounter for blood-alcohol and blood-drug test: Secondary | ICD-10-CM

## 2015-09-14 DIAGNOSIS — Z1389 Encounter for screening for other disorder: Secondary | ICD-10-CM

## 2015-09-14 LAB — POCT URINALYSIS DIPSTICK
Blood, UA: NEGATIVE
GLUCOSE UA: NEGATIVE
KETONES UA: NEGATIVE
Leukocytes, UA: NEGATIVE
NITRITE UA: NEGATIVE
Protein, UA: NEGATIVE

## 2015-09-14 NOTE — Patient Instructions (Signed)
 First Trimester of Pregnancy The first trimester of pregnancy is from week 1 until the end of week 12 (months 1 through 3). A week after a sperm fertilizes an egg, the egg will implant on the wall of the uterus. This embryo will begin to develop into a baby. Genes from you and your partner are forming the baby. The female genes determine whether the baby is a boy or a girl. At 6-8 weeks, the eyes and face are formed, and the heartbeat can be seen on ultrasound. At the end of 12 weeks, all the baby's organs are formed.  Now that you are pregnant, you will want to do everything you can to have a healthy baby. Two of the most important things are to get good prenatal care and to follow your health care provider's instructions. Prenatal care is all the medical care you receive before the baby's birth. This care will help prevent, find, and treat any problems during the pregnancy and childbirth. BODY CHANGES Your body goes through many changes during pregnancy. The changes vary from woman to woman.   You may gain or lose a couple of pounds at first.  You may feel sick to your stomach (nauseous) and throw up (vomit). If the vomiting is uncontrollable, call your health care provider.  You may tire easily.  You may develop headaches that can be relieved by medicines approved by your health care provider.  You may urinate more often. Painful urination may mean you have a bladder infection.  You may develop heartburn as a result of your pregnancy.  You may develop constipation because certain hormones are causing the muscles that push waste through your intestines to slow down.  You may develop hemorrhoids or swollen, bulging veins (varicose veins).  Your breasts may begin to grow larger and become tender. Your nipples may stick out more, and the tissue that surrounds them (areola) may become darker.  Your gums may bleed and may be sensitive to brushing and flossing.  Dark spots or blotches  (chloasma, mask of pregnancy) may develop on your face. This will likely fade after the baby is born.  Your menstrual periods will stop.  You may have a loss of appetite.  You may develop cravings for certain kinds of food.  You may have changes in your emotions from day to day, such as being excited to be pregnant or being concerned that something may go wrong with the pregnancy and baby.  You may have more vivid and strange dreams.  You may have changes in your hair. These can include thickening of your hair, rapid growth, and changes in texture. Some women also have hair loss during or after pregnancy, or hair that feels dry or thin. Your hair will most likely return to normal after your baby is born. WHAT TO EXPECT AT YOUR PRENATAL VISITS During a routine prenatal visit:  You will be weighed to make sure you and the baby are growing normally.  Your blood pressure will be taken.  Your abdomen will be measured to track your baby's growth.  The fetal heartbeat will be listened to starting around week 10 or 12 of your pregnancy.  Test results from any previous visits will be discussed. Your health care provider may ask you:  How you are feeling.  If you are feeling the baby move.  If you have had any abnormal symptoms, such as leaking fluid, bleeding, severe headaches, or abdominal cramping.  If you have any questions. Other   tests that may be performed during your first trimester include:  Blood tests to find your blood type and to check for the presence of any previous infections. They will also be used to check for low iron levels (anemia) and Rh antibodies. Later in the pregnancy, blood tests for diabetes will be done along with other tests if problems develop.  Urine tests to check for infections, diabetes, or protein in the urine.  An ultrasound to confirm the proper growth and development of the baby.  An amniocentesis to check for possible genetic problems.  Fetal  screens for spina bifida and Down syndrome.  You may need other tests to make sure you and the baby are doing well. HOME CARE INSTRUCTIONS  Medicines  Follow your health care provider's instructions regarding medicine use. Specific medicines may be either safe or unsafe to take during pregnancy.  Take your prenatal vitamins as directed.  If you develop constipation, try taking a stool softener if your health care provider approves. Diet  Eat regular, well-balanced meals. Choose a variety of foods, such as meat or vegetable-based protein, fish, milk and low-fat dairy products, vegetables, fruits, and whole grain breads and cereals. Your health care provider will help you determine the amount of weight gain that is right for you.  Avoid raw meat and uncooked cheese. These carry germs that can cause birth defects in the baby.  Eating four or five small meals rather than three large meals a day may help relieve nausea and vomiting. If you start to feel nauseous, eating a few soda crackers can be helpful. Drinking liquids between meals instead of during meals also seems to help nausea and vomiting.  If you develop constipation, eat more high-fiber foods, such as fresh vegetables or fruit and whole grains. Drink enough fluids to keep your urine clear or pale yellow. Activity and Exercise  Exercise only as directed by your health care provider. Exercising will help you:  Control your weight.  Stay in shape.  Be prepared for labor and delivery.  Experiencing pain or cramping in the lower abdomen or low back is a good sign that you should stop exercising. Check with your health care provider before continuing normal exercises.  Try to avoid standing for long periods of time. Move your legs often if you must stand in one place for a long time.  Avoid heavy lifting.  Wear low-heeled shoes, and practice good posture.  You may continue to have sex unless your health care provider directs you  otherwise. Relief of Pain or Discomfort  Wear a good support bra for breast tenderness.   Take warm sitz baths to soothe any pain or discomfort caused by hemorrhoids. Use hemorrhoid cream if your health care provider approves.   Rest with your legs elevated if you have leg cramps or low back pain.  If you develop varicose veins in your legs, wear support hose. Elevate your feet for 15 minutes, 3-4 times a day. Limit salt in your diet. Prenatal Care  Schedule your prenatal visits by the twelfth week of pregnancy. They are usually scheduled monthly at first, then more often in the last 2 months before delivery.  Write down your questions. Take them to your prenatal visits.  Keep all your prenatal visits as directed by your health care provider. Safety  Wear your seat belt at all times when driving.  Make a list of emergency phone numbers, including numbers for family, friends, the hospital, and police and fire departments. General   Tips  Ask your health care provider for a referral to a local prenatal education class. Begin classes no later than at the beginning of month 6 of your pregnancy.  Ask for help if you have counseling or nutritional needs during pregnancy. Your health care provider can offer advice or refer you to specialists for help with various needs.  Do not use hot tubs, steam rooms, or saunas.  Do not douche or use tampons or scented sanitary pads.  Do not cross your legs for long periods of time.  Avoid cat litter boxes and soil used by cats. These carry germs that can cause birth defects in the baby and possibly loss of the fetus by miscarriage or stillbirth.  Avoid all smoking, herbs, alcohol, and medicines not prescribed by your health care provider. Chemicals in these affect the formation and growth of the baby.  Schedule a dentist appointment. At home, brush your teeth with a soft toothbrush and be gentle when you floss. SEEK MEDICAL CARE IF:   You have  dizziness.  You have mild pelvic cramps, pelvic pressure, or nagging pain in the abdominal area.  You have persistent nausea, vomiting, or diarrhea.  You have a bad smelling vaginal discharge.  You have pain with urination.  You notice increased swelling in your face, hands, legs, or ankles. SEEK IMMEDIATE MEDICAL CARE IF:   You have a fever.  You are leaking fluid from your vagina.  You have spotting or bleeding from your vagina.  You have severe abdominal cramping or pain.  You have rapid weight gain or loss.  You vomit blood or material that looks like coffee grounds.  You are exposed to German measles and have never had them.  You are exposed to fifth disease or chickenpox.  You develop a severe headache.  You have shortness of breath.  You have any kind of trauma, such as from a fall or a car accident. Document Released: 05/09/2001 Document Revised: 09/29/2013 Document Reviewed: 03/25/2013 [employer] Patient Information 2015 [employer]. This information is not intended to replace advice given to you by your health care provider. Make sure you discuss any questions you have with your health care provider.   Nausea & Vomiting  Have saltine crackers or pretzels by your bed and eat a few bites before you raise your head out of bed in the morning  Eat small frequent meals throughout the day instead of large meals  Drink plenty of fluids throughout the day to stay hydrated, just don't drink a lot of fluids with your meals.  This can make your stomach fill up faster making you feel sick  Do not brush your teeth right after you eat  Products with real ginger are good for nausea, like ginger ale and ginger hard candy Make sure it says made with real ginger!  Sucking on sour candy like lemon heads is also good for nausea  If your prenatal vitamins make you nauseated, take them at night so you will sleep through the nausea  Sea Bands  If you feel like you need  medicine for the nausea & vomiting please let us know  If you are unable to keep any fluids or food down please let us know   Constipation  Drink plenty of fluid, preferably water, throughout the day  Eat foods high in fiber such as fruits, vegetables, and grains  Exercise, such as walking, is a good way to keep your bowels regular  Drink warm fluids, especially warm   prune juice, or decaf coffee  Eat a 1/2 cup of real oatmeal (not instant), 1/2 cup applesauce, and 1/2-1 cup warm prune juice every day  If needed, you may take Colace (docusate sodium) stool softener once or twice a day to help keep the stool soft. If you are pregnant, wait until you are out of your first trimester (12-14 weeks of pregnancy)  If you still are having problems with constipation, you may take Miralax once daily as needed to help keep your bowels regular.  If you are pregnant, wait until you are out of your first trimester (12-14 weeks of pregnancy)  Safe Medications in Pregnancy   Acne: Benzoyl Peroxide Salicylic Acid  Backache/Headache: Tylenol: 2 regular strength every 4 hours OR              2 Extra strength every 6 hours  Colds/Coughs/Allergies: Benadryl (alcohol free) 25 mg every 6 hours as needed Breath right strips Claritin Cepacol throat lozenges Chloraseptic throat spray Cold-Eeze- up to three times per day Cough drops, alcohol free Flonase (by prescription only) Guaifenesin Mucinex Robitussin DM (plain only, alcohol free) Saline nasal spray/drops Sudafed (pseudoephedrine) & Actifed ** use only after [redacted] weeks gestation and if you do not have high blood pressure Tylenol Vicks Vaporub Zinc lozenges Zyrtec   Constipation: Colace Ducolax suppositories Fleet enema Glycerin suppositories Metamucil Milk of magnesia Miralax Senokot Smooth move tea  Diarrhea: Kaopectate Imodium A-D  *NO pepto Bismol  Hemorrhoids: Anusol Anusol HC Preparation  H Tucks  Indigestion: Tums Maalox Mylanta Zantac  Pepcid  Insomnia: Benadryl (alcohol free) 25mg every 6 hours as needed Tylenol PM Unisom, no Gelcaps  Leg Cramps: Tums MagGel  Nausea/Vomiting:  Bonine Dramamine Emetrol Ginger extract Sea bands Meclizine  Nausea medication to take during pregnancy:  Unisom (doxylamine succinate 25 mg tablets) Take one tablet daily at bedtime. If symptoms are not adequately controlled, the dose can be increased to a maximum recommended dose of two tablets daily (1/2 tablet in the morning, 1/2 tablet mid-afternoon and one at bedtime). Vitamin B6 100mg tablets. Take one tablet twice a day (up to 200 mg per day).  Skin Rashes: Aveeno products Benadryl cream or 25mg every 6 hours as needed Calamine Lotion 1% cortisone cream  Yeast infection: Gyne-lotrimin 7 Monistat 7   **If taking multiple medications, please check labels to avoid duplicating the same active ingredients **take medication as directed on the label ** Do not exceed 4000 mg of tylenol in 24 hours **Do not take medications that contain aspirin or ibuprofen      

## 2015-09-14 NOTE — Progress Notes (Signed)
  Subjective:    Terri PiedraCourtney D Hartsough is a [id number] 195w5d being seen today for her first obstetrical visit.  Her obstetrical history is significant for uncompllicated term SVD.  Pregnancy history fully reviewed.  Patient reports no complaints.  Filed Vitals:   09/14/15 1510  BP: 124/74  Pulse: 100  Weight: 148 lb 8 oz (67.359 kg)    HISTORY: OB History  Gravida Para Term Preterm AB SAB TAB Ectopic Multiple Living  3 1 1  0 1 1 0 0 0 1    # Outcome Date GA Lbr Len/2nd Weight Sex Delivery Anes PTL Lv  3 Current           2 Term 04/04/09 8849w5d 17:00 7 lb 2 oz (3.232 kg) M Vag-Spont EPI N Y  1 SAB              Comments: 10wks w/ D&C @ [facility]     Past Medical History  Diagnosis Date  . Medical history non-contributory   . Missed abortion 03/15/2014  . Pregnant 08/20/2015  . UTI (lower urinary tract infection) 08/20/2015  . Vaginal discharge 08/20/2015   Past Surgical History  Procedure Laterality Date  . Dilation and curettage of uterus     Family History  Problem Relation Age of Onset  . Hypertension Mother   . Diabetes Sister   . Diabetes Maternal Grandfather   . Cancer Maternal Grandfather     liver, lung     Exam                                      System:     Skin: normal coloration and turgor, no rashes    Neurologic: oriented, normal, normal mood   Extremities: normal strength, tone, and muscle mass   HEENT PERRLA   Mouth/Teeth mucous membranes moist, normal dentition   Neck supple and no masses   Cardiovascular: regular rate and rhythm   Respiratory:  appears well, vitals normal, no respiratory distress, acyanotic   Abdomen: soft, non-tender;  FHR: 150us          Assessment:    Pregnancy: G3P1011 Patient Active Problem List   Diagnosis Date Noted  . Supervision of normal pregnancy 09/14/2015  . Pregnant 08/20/2015  . UTI (lower urinary tract infection) 08/20/2015  . Vaginal discharge 08/20/2015  . Trichomonal vaginitis 04/29/2015  .  Generalized anxiety disorder 12/09/2014  . Miscarriage 03/23/2014  . Rubella non-immune status, antepartum 03/10/2014        Plan:     Initial labs drawn. Continue prenatal vitamins  Problem list reviewed and updated  Reviewed n/v relief measures and warning s/s to report  Reviewed recommended weight gain based on pre-gravid BMI  Encouraged well-balanced diet Genetic Screening discussed Integrated Screen: requested.  Ultrasound discussed; fetal survey: requested.  Return in about 1 day (around 09/15/2015) for NT/IT only; 4 weeks for LROB/2nd IT.  CRESENZO-DISHMAN,Ohn Bostic 09/14/2015

## 2015-09-15 ENCOUNTER — Encounter: Payer: Self-pay | Admitting: *Deleted

## 2015-09-15 ENCOUNTER — Encounter: Payer: Self-pay | Admitting: Obstetrics & Gynecology

## 2015-09-15 ENCOUNTER — Other Ambulatory Visit: Payer: Medicaid Other

## 2015-09-15 LAB — PMP SCREEN PROFILE (10S), URINE
AMPHETAMINE SCRN UR: NEGATIVE ng/mL
Barbiturate Screen, Ur: NEGATIVE ng/mL
Benzodiazepine Screen, Urine: NEGATIVE ng/mL
CREATININE(CRT), U: 61.4 mg/dL (ref 20.0–300.0)
Cannabinoids Ur Ql Scn: POSITIVE ng/mL
Cocaine(Metab.)Screen, Urine: NEGATIVE ng/mL
METHADONE SCREEN, URINE: NEGATIVE ng/mL
OPIATE SCRN UR: NEGATIVE ng/mL
OXYCODONE+OXYMORPHONE UR QL SCN: NEGATIVE ng/mL
PCP SCRN UR: NEGATIVE ng/mL
PH UR, DRUG SCRN: 7.9 (ref 4.5–8.9)
PROPOXYPHENE SCREEN: NEGATIVE ng/mL

## 2015-09-15 LAB — MICROSCOPIC EXAMINATION
CASTS: NONE SEEN /LPF
Epithelial Cells (non renal): >10 /hpf — AB (ref 0–10)

## 2015-09-15 LAB — URINALYSIS, ROUTINE W REFLEX MICROSCOPIC
BILIRUBIN UA: NEGATIVE
Glucose, UA: NEGATIVE
KETONES UA: NEGATIVE
Nitrite, UA: NEGATIVE
RBC UA: NEGATIVE
SPEC GRAV UA: 1.019 (ref 1.005–1.030)
Urobilinogen, Ur: 0.2 mg/dL (ref 0.2–1.0)
pH, UA: 7.5 (ref 5.0–7.5)

## 2015-09-15 LAB — GC/CHLAMYDIA PROBE AMP
Chlamydia trachomatis, NAA: NEGATIVE
Neisseria gonorrhoeae by PCR: NEGATIVE

## 2015-09-15 LAB — RPR: RPR: NONREACTIVE

## 2015-09-15 LAB — HEPATITIS B SURFACE ANTIGEN: HEP B S AG: NEGATIVE

## 2015-09-15 LAB — VARICELLA ZOSTER ANTIBODY, IGG: VARICELLA: 2913 {index} (ref >165)

## 2015-09-15 LAB — CBC
HEMATOCRIT: 39.1 % (ref 34.0–46.6)
HEMOGLOBIN: 13.1 g/dL (ref 11.1–15.9)
MCH: 29.1 pg (ref 26.6–33.0)
MCHC: 33.5 g/dL (ref 31.5–35.7)
MCV: 87 fL (ref 79–97)
Platelets: 203 10*3/uL (ref 150–379)
RBC: 4.5 x10E6/uL (ref 3.77–5.28)
RDW: 14.7 % (ref 12.3–15.4)
WBC: 11.5 10*3/uL — AB (ref 3.4–10.8)

## 2015-09-15 LAB — ANTIBODY SCREEN: Antibody Screen: NEGATIVE

## 2015-09-15 LAB — HIV ANTIBODY (ROUTINE TESTING W REFLEX): HIV Screen 4th Generation wRfx: NONREACTIVE

## 2015-09-16 LAB — URINE CULTURE

## 2015-09-20 ENCOUNTER — Encounter: Payer: Self-pay | Admitting: Women's Health

## 2015-09-20 ENCOUNTER — Ambulatory Visit (INDEPENDENT_AMBULATORY_CARE_PROVIDER_SITE_OTHER): Payer: Medicaid Other | Admitting: Women's Health

## 2015-09-20 ENCOUNTER — Telehealth: Payer: Self-pay | Admitting: *Deleted

## 2015-09-20 VITALS — BP 122/70 | HR 68 | Wt 149.0 lb

## 2015-09-20 DIAGNOSIS — F1290 Cannabis use, unspecified, uncomplicated: Secondary | ICD-10-CM | POA: Insufficient documentation

## 2015-09-20 DIAGNOSIS — Z3491 Encounter for supervision of normal pregnancy, unspecified, first trimester: Secondary | ICD-10-CM

## 2015-09-20 DIAGNOSIS — Z1389 Encounter for screening for other disorder: Secondary | ICD-10-CM

## 2015-09-20 DIAGNOSIS — A5901 Trichomonal vulvovaginitis: Secondary | ICD-10-CM

## 2015-09-20 DIAGNOSIS — Z3492 Encounter for supervision of normal pregnancy, unspecified, second trimester: Secondary | ICD-10-CM

## 2015-09-20 DIAGNOSIS — O368910 Maternal care for other specified fetal problems, first trimester, not applicable or unspecified: Secondary | ICD-10-CM

## 2015-09-20 DIAGNOSIS — F1210 Cannabis abuse, uncomplicated: Secondary | ICD-10-CM

## 2015-09-20 DIAGNOSIS — O468X1 Other antepartum hemorrhage, first trimester: Secondary | ICD-10-CM

## 2015-09-20 DIAGNOSIS — O418X90 Other specified disorders of amniotic fluid and membranes, unspecified trimester, not applicable or unspecified: Secondary | ICD-10-CM | POA: Insufficient documentation

## 2015-09-20 DIAGNOSIS — O468X9 Other antepartum hemorrhage, unspecified trimester: Secondary | ICD-10-CM

## 2015-09-20 DIAGNOSIS — O418X10 Other specified disorders of amniotic fluid and membranes, first trimester, not applicable or unspecified: Secondary | ICD-10-CM

## 2015-09-20 DIAGNOSIS — Z331 Pregnant state, incidental: Secondary | ICD-10-CM

## 2015-09-20 NOTE — Patient Instructions (Signed)
No sex for at least 7 days from last time you saw any bleeding  Subchorionic Hematoma A subchorionic hematoma is a gathering of blood between the outer wall of the placenta and the inner wall of the womb (uterus). The placenta is the organ that connects the fetus to the wall of the uterus. The placenta performs the feeding, breathing (oxygen to the fetus), and waste removal (excretory work) of the fetus.  Subchorionic hematoma is the most common abnormality found on a result from ultrasonography done during the first trimester or early second trimester of pregnancy. If there has been little or no vaginal bleeding, early small hematomas usually shrink on their own and do not affect your baby or pregnancy. The blood is gradually absorbed over 1-2 weeks. When bleeding starts later in pregnancy or the hematoma is larger or occurs in an older pregnant woman, the outcome may not be as good. Larger hematomas may get bigger, which increases the chances for miscarriage. Subchorionic hematoma also increases the risk of premature detachment of the placenta from the uterus, preterm (premature) labor, and stillbirth. HOME CARE INSTRUCTIONS  Stay on bed rest if your health care provider recommends this. Although bed rest will not prevent more bleeding or prevent a miscarriage, your health care provider may recommend bed rest until you are advised otherwise.  Avoid heavy lifting (more than 10 lb [4.5 kg]), exercise, sexual intercourse, or douching as directed by your health care provider.  Keep track of the number of pads you use each day and how soaked (saturated) they are. Write down this information.  Do not use tampons.  Keep all follow-up appointments as directed by your health care provider. Your health care provider may ask you to have follow-up blood tests or ultrasound tests or both. SEEK IMMEDIATE MEDICAL CARE IF:  You have severe cramps in your stomach, back, abdomen, or pelvis.  You have a  fever.  You pass large clots or tissue. Save any tissue for your health care provider to look at.  Your bleeding increases or you become lightheaded, feel weak, or have fainting episodes.   This information is not intended to replace advice given to you by your health care provider. Make sure you discuss any questions you have with your health care provider.   Document Released: 08/30/2006 Document Revised: 06/05/2014 Document Reviewed: 12/12/2012 Elsevier Interactive Patient Education [employer]2016 [employer] Inc.

## 2015-09-20 NOTE — Progress Notes (Signed)
Work-in F/U from ED visit Low-risk OB appointment G3P1011 6172w4d Estimated Date of Delivery: 03/23/16 BP 122/70 mmHg  Pulse 68  Wt 149 lb (67.586 kg)  LMP 05/25/2015 (Approximate)  BP, weight, and urine reviewed.  Refer to obstetrical flow sheet for FH & FHR.  No fm yet. Denies cramping, lof, or uti s/s. Woke up Sat am w/ blood in underwear, went to br to void, no further bleeding since. Was visiting family in [place] so went to closest hospital- [facility]  [facility]. Discussed warning s/s to report. No sex til at least 7d from any vb.  Plan:  Continue routine obstetrical care  F/U as scheduled for OB appointment and 2nd IT, will add [facility]

## 2015-09-20 NOTE — Telephone Encounter (Signed)
Pt [facility] due to vaginal bleeding was told had a hemorrhage between uterus and placenta. Pt was given an appt to be evaluated today.

## 2015-09-29 ENCOUNTER — Telehealth: Payer: Self-pay | Admitting: *Deleted

## 2015-09-29 NOTE — Telephone Encounter (Signed)
Left message letting pt know nightmares are from pregnant hormones per Selena BattenKim, CNM. They may get better after 1st trimester. Pt was advised to call if she had further questions. JSY

## 2015-10-05 ENCOUNTER — Telehealth: Payer: Self-pay | Admitting: *Deleted

## 2015-10-05 NOTE — Telephone Encounter (Signed)
Pt c/o brownish discharge, states she had been told 3 weeks ago she had hemorrhage between placenta and uterus but was concerned because she had stopped having the brownish discharge and now it is reoccurring. Pt informed per Cyril MourningJennifer Griffin, NP continue to monitor brown blood can be normal but if bright red bleeding, cramping, or pain occurs to call our office back. Pt states she has had some back pain, pt advised to take Tylenol for back pain and push water if no improvement call our office back. Pt verbalized understanding.

## 2015-10-08 ENCOUNTER — Encounter: Payer: Medicaid Other | Admitting: Obstetrics & Gynecology

## 2015-10-08 ENCOUNTER — Encounter (HOSPITAL_COMMUNITY): Payer: Self-pay | Admitting: *Deleted

## 2015-10-08 ENCOUNTER — Inpatient Hospital Stay (HOSPITAL_COMMUNITY)
Admission: AD | Admit: 2015-10-08 | Discharge: 2015-10-08 | Disposition: A | Discharge status: Home / Self Care | Payer: Medicaid Other | Source: Ambulatory Visit | Attending: Obstetrics & Gynecology | Admitting: Obstetrics & Gynecology

## 2015-10-08 DIAGNOSIS — Z3A16 16 weeks gestation of pregnancy: Secondary | ICD-10-CM

## 2015-10-08 DIAGNOSIS — O418X90 Other specified disorders of amniotic fluid and membranes, unspecified trimester, not applicable or unspecified: Secondary | ICD-10-CM

## 2015-10-08 DIAGNOSIS — O98312 Other infections with a predominantly sexual mode of transmission complicating pregnancy, second trimester: Secondary | ICD-10-CM | POA: Diagnosis not present

## 2015-10-08 DIAGNOSIS — A5901 Trichomonal vulvovaginitis: Secondary | ICD-10-CM | POA: Diagnosis not present

## 2015-10-08 DIAGNOSIS — O4692 Antepartum hemorrhage, unspecified, second trimester: Secondary | ICD-10-CM | POA: Diagnosis present

## 2015-10-08 DIAGNOSIS — O468X9 Other antepartum hemorrhage, unspecified trimester: Secondary | ICD-10-CM

## 2015-10-08 DIAGNOSIS — R1030 Lower abdominal pain, unspecified: Secondary | ICD-10-CM | POA: Insufficient documentation

## 2015-10-08 HISTORY — DX: Anxiety disorder, unspecified: F41.9

## 2015-10-08 HISTORY — DX: Headache, unspecified: R51.9

## 2015-10-08 HISTORY — DX: Headache: R51

## 2015-10-08 LAB — WET PREP, GENITAL
CLUE CELLS WET PREP: NONE SEEN
Sperm: NONE SEEN
YEAST WET PREP: NONE SEEN

## 2015-10-08 MED ORDER — METRONIDAZOLE 500 MG PO TABS
2000.0000 mg | ORAL_TABLET | Freq: Once | ORAL | Status: AC
  Administered 2015-10-08: 2000 mg via ORAL
  Filled 2015-10-08: qty 4

## 2015-10-08 NOTE — MAU Provider Note (Signed)
MAU HISTORY AND PHYSICAL  Chief Complaint:  Abdominal Pain and Vaginal Bleeding   Ann Moore is a 25 y.o.  G3P1011 with IUP at [redacted]w[redacted]d presenting for Abdominal Pain and Vaginal Bleeding  Known subchorionic hemorrhage. Had small amount of bleeding last week, stopped Wednesday. This morning bleeding like period bright red. Now stopped. No fever, dysuria, or back pain. Having some some lower abdominal cramping.    Past Medical History  Diagnosis Date  . Medical history non-contributory   . Missed abortion 03/15/2014  . Pregnant 08/20/2015  . UTI (lower urinary tract infection) 08/20/2015  . Vaginal discharge 08/20/2015  . Headache   . Anxiety     Past Surgical History  Procedure Laterality Date  . Dilation and curettage of uterus      Family History  Problem Relation Age of Onset  . Hypertension Mother   . Diabetes Sister   . Diabetes Maternal Grandfather   . Cancer Maternal Grandfather     liver, lung    Social History  Substance Use Topics  . Smoking status: Former Smoker -- 0.00 packs/day for 7 years    Types: Cigarettes    Quit date: 08/28/2015  . Smokeless tobacco: Never Used     Comment: quit with preg confirmation  . Alcohol Use: No    Allergies  Allergen Reactions  . Hydrocodone Nausea And Vomiting and Rash    Prescriptions prior to admission  Medication Sig Dispense Refill Last Dose  . prenatal vitamin w/FE, FA (PRENATAL 1 + 1) 27-1 MG TABS tablet Take 1 tablet by mouth daily at 12 noon. 30 each 11 10/07/2015 at Unknown time  . promethazine (PHENERGAN) 25 MG tablet Take 1 tablet (25 mg total) by mouth every 6 (six) hours as needed for nausea or vomiting. 30 tablet 1 09/17/2015    Review of Systems - Negative except for what is mentioned in HPI.  Physical Exam  Blood pressure 128/83, pulse 105, temperature 98.4 F (36.9 C), temperature source Oral, resp. rate 18, weight 148 lb 3.2 oz (67.223 kg), last menstrual period 05/25/2015. GENERAL:  Well-developed, well-nourished female in no acute distress.  LUNGS: Clear to auscultation bilaterally.  HEART: Regular rate and rhythm. ABDOMEN: Soft, mild suprapubic ttp, nondistended, gravid.  EXTREMITIES: Nontender, no edema, 2+ distal pulses. Cervical Exam: deferred, visually closed on SSE. On SSE scant tan discharge, no visible blood, normal appearing gravid cervix. No lacerations, no hemorrhoids. FHT:  150s   Labs: Results for orders placed or performed during the hospital encounter of 10/08/15 (from the past 24 hour(s))  Wet prep, genital   Collection Time: 10/08/15 10:35 AM  Result Value Ref Range   Yeast Wet Prep HPF POC NONE SEEN NONE SEEN   Trich, Wet Prep PRESENT (A) NONE SEEN   Clue Cells Wet Prep HPF POC NONE SEEN NONE SEEN   WBC, Wet Prep HPF POC MANY (A) NONE SEEN   Sperm NONE SEEN     Imaging Studies:  No results found.  Assessment: Ann Moore is  25 y.o. G3P1011 at [redacted]w[redacted]d presents with Abdominal Pain and Vaginal Bleeding Known subchorionic hemorrhage may be all or partial cause, but no significant bleeding seen on exam so no concern for maternal hemodynamic complications, and positive FHTs. Hx trich this pregnancy and wet positive, so that may very well be the sole cause. Treating patient as well as partner (confirmed telephonically no allergies) both with metronidazole. Counseled no sex for 1 week after both are treated, and strongly recommended  HD f/u for partner for further STD testing, with no sex until those results available. Bleeding return precautions discussed.   Cherrie Gauzeoah B Wouk 5/12/201711:16 AM

## 2015-10-08 NOTE — Discharge Instructions (Signed)
Subchorionic Hematoma A subchorionic hematoma is a gathering of blood between the outer wall of the placenta and the inner wall of the womb (uterus). The placenta is the organ that connects the fetus to the wall of the uterus. The placenta performs the feeding, breathing (oxygen to the fetus), and waste removal (excretory work) of the fetus.  Subchorionic hematoma is the most common abnormality found on a result from ultrasonography done during the first trimester or early second trimester of pregnancy. If there has been little or no vaginal bleeding, early small hematomas usually shrink on their own and do not affect your baby or pregnancy. The blood is gradually absorbed over 1-2 weeks. When bleeding starts later in pregnancy or the hematoma is larger or occurs in an older pregnant woman, the outcome may not be as good. Larger hematomas may get bigger, which increases the chances for miscarriage. Subchorionic hematoma also increases the risk of premature detachment of the placenta from the uterus, preterm (premature) labor, and stillbirth. HOME CARE INSTRUCTIONS  Stay on bed rest if your health care provider recommends this. Although bed rest will not prevent more bleeding or prevent a miscarriage, your health care provider may recommend bed rest until you are advised otherwise.  Avoid heavy lifting (more than 10 lb [4.5 kg]), exercise, sexual intercourse, or douching as directed by your health care provider.  Keep track of the number of pads you use each day and how soaked (saturated) they are. Write down this information.  Do not use tampons.  Keep all follow-up appointments as directed by your health care provider. Your health care provider may ask you to have follow-up blood tests or ultrasound tests or both. SEEK IMMEDIATE MEDICAL CARE IF:  You have severe cramps in your stomach, back, abdomen, or pelvis.  You have a fever.  You pass large clots or tissue. Save any tissue for your health  care provider to look at.  Your bleeding increases or you become lightheaded, feel weak, or have fainting episodes.   This information is not intended to replace advice given to you by your health care provider. Make sure you discuss any questions you have with your health care provider.   Document Released: 08/30/2006 Document Revised: 06/05/2014 Document Reviewed: 12/12/2012 Elsevier Interactive Patient Education 2016 [employer]. Trichomoniasis Trichomoniasis is an infection caused by an organism called Trichomonas. The infection can affect both women and men. In women, the outer female genitalia and the vagina are affected. In men, the penis is mainly affected, but the prostate and other reproductive organs can also be involved. Trichomoniasis is a sexually transmitted infection (STI) and is most often passed to another person through sexual contact.  RISK FACTORS  Having unprotected sexual intercourse.  Having sexual intercourse with an infected partner. SIGNS AND SYMPTOMS  Symptoms of trichomoniasis in women include:  Abnormal gray-green frothy vaginal discharge.  Itching and irritation of the vagina.  Itching and irritation of the area outside the vagina. Symptoms of trichomoniasis in men include:   Penile discharge with or without pain.  Pain during urination. This results from inflammation of the urethra. DIAGNOSIS  Trichomoniasis may be found during a Pap test or physical exam. Your health care provider may use one of the following methods to help diagnose this infection:  Testing the pH of the vagina with a test tape.  Using a vaginal swab test that checks for the Trichomonas organism. A test is available that provides results within a few minutes.  Examining a  urine sample.  Testing vaginal secretions. Your health care provider may test you for other STIs, including HIV. TREATMENT   You may be given medicine to fight the infection. Women should inform their  health care provider if they could be or are pregnant. Some medicines used to treat the infection should not be taken during pregnancy.  Your health care provider may recommend over-the-counter medicines or creams to decrease itching or irritation.  Your sexual partner will need to be treated if infected.  Your health care provider may test you for infection again 3 months after treatment. HOME CARE INSTRUCTIONS   Take medicines only as directed by your health care provider.  Take over-the-counter medicine for itching or irritation as directed by your health care provider.  Do not have sexual intercourse while you have the infection.  Women should not douche or wear tampons while they have the infection.  Discuss your infection with your partner. Your partner may have gotten the infection from you, or you may have gotten it from your partner.  Have your sex partner get examined and treated if necessary.  Practice safe, informed, and protected sex.  See your health care provider for other STI testing. SEEK MEDICAL CARE IF:   You still have symptoms after you finish your medicine.  You develop abdominal pain.  You have pain when you urinate.  You have bleeding after sexual intercourse.  You develop a rash.  Your medicine makes you sick or makes you throw up (vomit). MAKE SURE YOU:  Understand these instructions.  Will watch your condition.  Will get help right away if you are not doing well or get worse.   This information is not intended to replace advice given to you by your health care provider. Make sure you discuss any questions you have with your health care provider.   Document Released: 11/08/2000 Document Revised: 06/05/2014 Document Reviewed: 02/24/2013 Elsevier Interactive Patient Education [employer]2016 Elsevier Inc.

## 2015-10-08 NOTE — MAU Note (Signed)
Hx of subchorionic hemorrhage.  Hurting a lot worse this morning.  Woke up with bright red bleeding, more like her period was spotting.  When last checked it, was still bright red, spotting.

## 2015-10-11 LAB — GC/CHLAMYDIA PROBE AMP (~~LOC~~) NOT AT ARMC
Chlamydia: NEGATIVE
NEISSERIA GONORRHEA: NEGATIVE

## 2015-10-12 ENCOUNTER — Encounter: Payer: Self-pay | Admitting: Women's Health

## 2015-10-12 ENCOUNTER — Ambulatory Visit (INDEPENDENT_AMBULATORY_CARE_PROVIDER_SITE_OTHER): Payer: Medicaid Other | Admitting: Women's Health

## 2015-10-12 ENCOUNTER — Ambulatory Visit (INDEPENDENT_AMBULATORY_CARE_PROVIDER_SITE_OTHER): Payer: Medicaid Other

## 2015-10-12 VITALS — BP 128/74 | HR 80 | Wt 150.0 lb

## 2015-10-12 DIAGNOSIS — O99342 Other mental disorders complicating pregnancy, second trimester: Secondary | ICD-10-CM

## 2015-10-12 DIAGNOSIS — O368921 Maternal care for other specified fetal problems, second trimester, fetus 1: Secondary | ICD-10-CM

## 2015-10-12 DIAGNOSIS — Z3492 Encounter for supervision of normal pregnancy, unspecified, second trimester: Secondary | ICD-10-CM

## 2015-10-12 DIAGNOSIS — Z3A17 17 weeks gestation of pregnancy: Secondary | ICD-10-CM

## 2015-10-12 DIAGNOSIS — O43892 Other placental disorders, second trimester: Secondary | ICD-10-CM

## 2015-10-12 DIAGNOSIS — O418X10 Other specified disorders of amniotic fluid and membranes, first trimester, not applicable or unspecified: Secondary | ICD-10-CM

## 2015-10-12 DIAGNOSIS — Z363 Encounter for antenatal screening for malformations: Secondary | ICD-10-CM

## 2015-10-12 DIAGNOSIS — Z3682 Encounter for antenatal screening for nuchal translucency: Secondary | ICD-10-CM

## 2015-10-12 DIAGNOSIS — Z331 Pregnant state, incidental: Secondary | ICD-10-CM

## 2015-10-12 DIAGNOSIS — O468X1 Other antepartum hemorrhage, first trimester: Secondary | ICD-10-CM

## 2015-10-12 DIAGNOSIS — Z1389 Encounter for screening for other disorder: Secondary | ICD-10-CM

## 2015-10-12 DIAGNOSIS — Z3482 Encounter for supervision of other normal pregnancy, second trimester: Secondary | ICD-10-CM

## 2015-10-12 LAB — POCT URINALYSIS DIPSTICK
Glucose, UA: NEGATIVE
Ketones, UA: NEGATIVE
LEUKOCYTES UA: NEGATIVE
Nitrite, UA: NEGATIVE
PROTEIN UA: NEGATIVE

## 2015-10-12 MED ORDER — SERTRALINE HCL 25 MG PO TABS: 25.0000 mg | ORAL_TABLET | Freq: Every day | ORAL | Status: DC

## 2015-10-12 NOTE — Progress Notes (Signed)
Low-risk OB appointment G3P1011 2445w5d Estimated Date of Delivery: 03/23/16 BP 128/74 mmHg  Pulse 80  Wt 150 lb (68.04 kg)  LMP 05/25/2015 (Approximate)  BP, weight, and urine reviewed.  Refer to obstetrical flow sheet for FH & FHR.  No fm yet. Denies cramping, lof,or uti s/s. Still spotting, not daily, just sporadically and brown when wiping. Rh +. Angry w/ bad mood swings all the time, feels depressed at times- denies SI/HI, sleeping and eating ok, still finds joy in things she used to- tried lexapro during last pregnancy but made her feel like she 'wasn't there'- does want to try getting back on meds. Declines counseling. Will try zoloft 25mg  daily.  [facility].6[facility] (also discussed w/ JVF)- discussed increased r/f SAB, continue pelvic rest until further notice- will recheck in 2wks.Rh+. Plan:  Continue routine obstetrical care  F/U in 2wks for OB appointment and anatomy u/s & [facility]

## 2015-10-12 NOTE — Patient Instructions (Signed)

## 2015-10-12 NOTE — Progress Notes (Signed)
US 16+5 wks,measurements c/w dates,normal ov's bilat, large post subchorionic hemorrhage that covers the internal os to the tip of the ant pl. 9.1 x 3.6 x 9 cm (very difficult to measure) pos fht 141 bpm,svp of fluid 5.3cm,efw 186 g

## 2015-10-14 ENCOUNTER — Telehealth: Payer: Self-pay | Admitting: Advanced Practice Midwife

## 2015-10-14 NOTE — Telephone Encounter (Signed)
Spoke with pt. Pt is constipated. I advised to eat 4 prunes daily. Drink prune juice and plenty of water. Pt also has a sore throat and congestion. I advised to gargle warm salt water and take Benadryl. Pt voiced understanding. JSY

## 2015-10-15 LAB — AFP, QUAD SCREEN
DIA MOM VALUE: 1.49
DIA VALUE (EIA): 262.19 pg/mL
DSR (By Age)    1 IN: 1060
DSR (SECOND TRIMESTER) 1 IN: 10000
Gestational Age: 16.7 WEEKS
MATERNAL AGE AT EDD: 24.3 a
MSAFP Mom: 1.92
MSAFP: 69.1 ng/mL
MSHCG Mom: 0.35
MSHCG: 12332 m[IU]/mL
Osb Risk: 965
T18 (By Age): 1:4128 {titer}
TEST RESULTS AFP: NEGATIVE
WEIGHT: 150 [lb_av]
uE3 Mom: 1.03
uE3 Value: 1.01 ng/mL

## 2015-10-18 ENCOUNTER — Ambulatory Visit (INDEPENDENT_AMBULATORY_CARE_PROVIDER_SITE_OTHER): Payer: Medicaid Other | Admitting: Obstetrics & Gynecology

## 2015-10-18 VITALS — BP 126/80 | HR 100 | Wt 147.5 lb

## 2015-10-18 DIAGNOSIS — O468X2 Other antepartum hemorrhage, second trimester: Secondary | ICD-10-CM

## 2015-10-18 DIAGNOSIS — O418X20 Other specified disorders of amniotic fluid and membranes, second trimester, not applicable or unspecified: Secondary | ICD-10-CM

## 2015-10-18 NOTE — Progress Notes (Signed)
[employer] in [employer] appt:  [facility] bleeding She is known to have a subchorionic hemorrhage 2 on ultrasound which are smaller but are still pretty significant Her ultrasounds of otherwise been normal Her bleeding is pretty much stopped now Fetal heart rate 165 Abdomen soft No blood in the vault at this point  Impression 17 weeks and 4 days gestation with subchorionic hemorrhage hemorrhage with an active bleed Fetal status is reassuring  Plan minimize activity today no intercourse scheduled appointment next week unless significant bleeding recurs patient and family are reassured     Face to face time:  15 minutes  Greater than 50% of the visit time was spent in counseling and coordination of care with the patient.  The summary and outline of the counseling and care coordination is summarized in the note above.   All questions were answered.   Return for keep appt.

## 2015-10-26 ENCOUNTER — Ambulatory Visit (INDEPENDENT_AMBULATORY_CARE_PROVIDER_SITE_OTHER): Payer: Medicaid Other | Admitting: Women's Health

## 2015-10-26 ENCOUNTER — Encounter: Payer: Self-pay | Admitting: Women's Health

## 2015-10-26 ENCOUNTER — Ambulatory Visit (INDEPENDENT_AMBULATORY_CARE_PROVIDER_SITE_OTHER): Payer: Medicaid Other

## 2015-10-26 VITALS — BP 122/68 | HR 76 | Wt 150.0 lb

## 2015-10-26 DIAGNOSIS — F411 Generalized anxiety disorder: Secondary | ICD-10-CM

## 2015-10-26 DIAGNOSIS — O43892 Other placental disorders, second trimester: Secondary | ICD-10-CM | POA: Diagnosis not present

## 2015-10-26 DIAGNOSIS — A599 Trichomoniasis, unspecified: Secondary | ICD-10-CM | POA: Diagnosis not present

## 2015-10-26 DIAGNOSIS — Z3492 Encounter for supervision of normal pregnancy, unspecified, second trimester: Secondary | ICD-10-CM

## 2015-10-26 DIAGNOSIS — Z1389 Encounter for screening for other disorder: Secondary | ICD-10-CM

## 2015-10-26 DIAGNOSIS — Z3A19 19 weeks gestation of pregnancy: Secondary | ICD-10-CM | POA: Diagnosis not present

## 2015-10-26 DIAGNOSIS — Z331 Pregnant state, incidental: Secondary | ICD-10-CM

## 2015-10-26 DIAGNOSIS — O418X20 Other specified disorders of amniotic fluid and membranes, second trimester, not applicable or unspecified: Secondary | ICD-10-CM

## 2015-10-26 DIAGNOSIS — Z36 Encounter for antenatal screening of mother: Secondary | ICD-10-CM | POA: Diagnosis not present

## 2015-10-26 DIAGNOSIS — O468X2 Other antepartum hemorrhage, second trimester: Secondary | ICD-10-CM

## 2015-10-26 DIAGNOSIS — Z363 Encounter for antenatal screening for malformations: Secondary | ICD-10-CM

## 2015-10-26 LAB — POCT WET PREP (WET MOUNT): Clue Cells Wet Prep Whiff POC: NEGATIVE

## 2015-10-26 LAB — POCT URINALYSIS DIPSTICK
GLUCOSE UA: NEGATIVE
Ketones, UA: NEGATIVE
Nitrite, UA: NEGATIVE
Protein, UA: NEGATIVE
RBC UA: NEGATIVE

## 2015-10-26 MED ORDER — MICONAZOLE NITRATE 2 % VA CREA: 1.0000 | TOPICAL_CREAM | Freq: Every day | VAGINAL | Status: DC

## 2015-10-26 NOTE — Progress Notes (Signed)
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

## 2015-10-26 NOTE — Progress Notes (Signed)
Low-risk OB appointment G3P1011 7976w5d Estimated Date of Delivery: 03/23/16 BP 122/68 mmHg  Pulse 76  Wt 150 lb (68.04 kg)  LMP 05/25/2015 (Approximate)  BP, weight, and urine reviewed.  Refer to obstetrical flow sheet for FH & FHR.  Reports good fm.  Denies regular uc's, lof, or uti s/s. Occ spotting, mostly brownish. Denies abnormal d/c, odor. Some vulvar itching. +trich 5/12 in mau (tx that day) hasn't had sex since d/t pelvic rest.  Spec exam cx visually closed, thick clumpy white d/c adherent to walls, small amt light brown nonodorous d/c. Wet prep many yeast hyphae, neg trich. Rx miconazole 7 per request.  Reviewed warning s/s to report, todays f/u u/s- female w/ CP cyst, otherwise normal anatomy, genetic screening was neg. [facility] to be 2 separate areas, (#1) anterior, 8.1x3x2.5, (#2) 5.8x2x1.7 Plan:  Continue routine obstetrical care  F/U in 2wks for OB appointment and u[facility]s [facility]

## 2015-10-26 NOTE — Patient Instructions (Signed)
[place] Pediatricians/Family Doctors:  [facility] [phone]                 [place] [phone] (usually not accepting new patients unless you have family there already, you are always welcome to call and ask)            [facility] ([place]) [phone]   [facility] Pediatricians/Family Doctors:   [place]: [phone]  [facility]: [phone]   Second Trimester of Pregnancy The second trimester is from week 13 through week 28, months 4 through 6. The second trimester is often a time when you feel your best. Your body has also adjusted to being pregnant, and you begin to feel better physically. Usually, morning sickness has lessened or quit completely, you may have more energy, and you may have an increase in appetite. The second trimester is also a time when the fetus is growing rapidly. At the end of the sixth month, the fetus is about 9 inches long and weighs about 1 pounds. You will likely begin to feel the baby move (quickening) between 18 and 20 weeks of the pregnancy. BODY CHANGES Your body goes through many changes during pregnancy. The changes vary from woman to woman.   Your weight will continue to increase. You will notice your lower abdomen bulging out.  You may begin to get stretch marks on your hips, abdomen, and breasts.  You may develop headaches that can be relieved by medicines approved by your health care provider.  You may urinate more often because the fetus is pressing on your bladder.  You may develop or continue to have heartburn as a result of your pregnancy.  You may develop constipation because certain hormones are causing the muscles that push waste through your intestines to slow down.  You may develop hemorrhoids or swollen, bulging veins (varicose veins).  You may have back pain because of the weight  gain and pregnancy hormones relaxing your joints between the bones in your pelvis and as a result of a shift in weight and the muscles that support your balance.  Your breasts will continue to grow and be tender.  Your gums may bleed and may be sensitive to brushing and flossing.  Dark spots or blotches (chloasma, mask of pregnancy) may develop on your face. This will likely fade after the baby is born.  A dark line from your belly button to the pubic area (linea nigra) may appear. This will likely fade after the baby is born.  You may have changes in your hair. These can include thickening of your hair, rapid growth, and changes in texture. Some women also have hair loss during or after pregnancy, or hair that feels dry or thin. Your hair will most likely return to normal after your baby is born. WHAT TO EXPECT AT YOUR PRENATAL VISITS During a routine prenatal visit:  You will be weighed to make sure you and the fetus are growing normally.  Your blood pressure will be taken.  Your abdomen will be measured to track your baby's growth.  The fetal heartbeat will be listened to.  Any test results from the previous visit will be discussed. Your health care provider may ask you:  How you are feeling.  If you are feeling the baby move.  If you have had any abnormal symptoms, such as leaking fluid, bleeding, severe  headaches, or abdominal cramping.  If you are using any tobacco products, including cigarettes, chewing tobacco, and electronic cigarettes.  If you have any questions. Other tests that may be performed during your second trimester include:  Blood tests that check for:  Low iron levels (anemia).  Gestational diabetes (between 24 and 28 weeks).  Rh antibodies.  Urine tests to check for infections, diabetes, or protein in the urine.  An ultrasound to confirm the proper growth and development of the baby.  An amniocentesis to check for possible genetic  problems.  Fetal screens for spina bifida and Down syndrome.  HIV (human immunodeficiency virus) testing. Routine prenatal testing includes screening for HIV, unless you choose not to have this test. HOME CARE INSTRUCTIONS   Avoid all smoking, herbs, alcohol, and unprescribed drugs. These chemicals affect the formation and growth of the baby.  Do not use any tobacco products, including cigarettes, chewing tobacco, and electronic cigarettes. If you need help quitting, ask your health care provider. You may receive counseling support and other resources to help you quit.  Follow your health care provider's instructions regarding medicine use. There are medicines that are either safe or unsafe to take during pregnancy.  Exercise only as directed by your health care provider. Experiencing uterine cramps is a good sign to stop exercising.  Continue to eat regular, healthy meals.  Wear a good support bra for breast tenderness.  Do not use hot tubs, steam rooms, or saunas.  Wear your seat belt at all times when driving.  Avoid raw meat, uncooked cheese, cat litter boxes, and soil used by cats. These carry germs that can cause birth defects in the baby.  Take your prenatal vitamins.  Take 1500-2000 mg of calcium daily starting at the 20th week of pregnancy until you deliver your baby.  Try taking a stool softener (if your health care provider approves) if you develop constipation. Eat more high-fiber foods, such as fresh vegetables or fruit and whole grains. Drink plenty of fluids to keep your urine clear or pale yellow.  Take warm sitz baths to soothe any pain or discomfort caused by hemorrhoids. Use hemorrhoid cream if your health care provider approves.  If you develop varicose veins, wear support hose. Elevate your feet for 15 minutes, 3-4 times a day. Limit salt in your diet.  Avoid heavy lifting, wear low heel shoes, and practice good posture.  Rest with your legs elevated if you  have leg cramps or low back pain.  Visit your dentist if you have not gone yet during your pregnancy. Use a soft toothbrush to brush your teeth and be gentle when you floss.  A sexual relationship may be continued unless your health care provider directs you otherwise.  Continue to go to all your prenatal visits as directed by your health care provider. SEEK MEDICAL CARE IF:   You have dizziness.  You have mild pelvic cramps, pelvic pressure, or nagging pain in the abdominal area.  You have persistent nausea, vomiting, or diarrhea.  You have a bad smelling vaginal discharge.  You have pain with urination. SEEK IMMEDIATE MEDICAL CARE IF:   You have a fever.  You are leaking fluid from your vagina.  You have spotting or bleeding from your vagina.  You have severe abdominal cramping or pain.  You have rapid weight gain or loss.  You have shortness of breath with chest pain.  You notice sudden or extreme swelling of your face, hands, ankles, feet, or legs.  You have not felt your baby move in over an hour.  You have severe headaches that do not go away with medicine.  You have vision changes.   This information is not intended to replace advice given to you by your health care provider. Make sure you discuss any questions you have with your health care provider.   Document Released: 05/09/2001 Document Revised: 06/05/2014 Document Reviewed: 07/16/2012 Elsevier Interactive Patient Education 2016 Elsevier Inc.  

## 2015-10-28 ENCOUNTER — Inpatient Hospital Stay (HOSPITAL_COMMUNITY)
Admission: AD | Admit: 2015-10-28 | Discharge: 2015-10-29 | Disposition: A | Discharge status: Home / Self Care | Payer: Medicaid Other | Source: Ambulatory Visit | Attending: Obstetrics & Gynecology | Admitting: Obstetrics & Gynecology

## 2015-10-28 ENCOUNTER — Encounter (HOSPITAL_COMMUNITY): Payer: Self-pay | Admitting: *Deleted

## 2015-10-28 ENCOUNTER — Inpatient Hospital Stay (HOSPITAL_COMMUNITY): Payer: Medicaid Other

## 2015-10-28 DIAGNOSIS — Z87891 Personal history of nicotine dependence: Secondary | ICD-10-CM | POA: Insufficient documentation

## 2015-10-28 DIAGNOSIS — O4692 Antepartum hemorrhage, unspecified, second trimester: Secondary | ICD-10-CM | POA: Diagnosis present

## 2015-10-28 DIAGNOSIS — O468X2 Other antepartum hemorrhage, second trimester: Secondary | ICD-10-CM

## 2015-10-28 DIAGNOSIS — O418X20 Other specified disorders of amniotic fluid and membranes, second trimester, not applicable or unspecified: Secondary | ICD-10-CM

## 2015-10-28 DIAGNOSIS — Z3A19 19 weeks gestation of pregnancy: Secondary | ICD-10-CM | POA: Diagnosis not present

## 2015-10-28 LAB — URINALYSIS, ROUTINE W REFLEX MICROSCOPIC
Bilirubin Urine: NEGATIVE
Glucose, UA: NEGATIVE mg/dL
Ketones, ur: NEGATIVE mg/dL
Nitrite: NEGATIVE
PH: 6 (ref 5.0–8.0)
Protein, ur: 30 mg/dL — AB
Specific Gravity, Urine: 1.01 (ref 1.005–1.030)

## 2015-10-28 LAB — URINE MICROSCOPIC-ADD ON

## 2015-10-28 LAB — CBC
HCT: 35 % — ABNORMAL LOW (ref 36.0–46.0)
Hemoglobin: 12.2 g/dL (ref 12.0–15.0)
MCH: 29.7 pg (ref 26.0–34.0)
MCHC: 34.9 g/dL (ref 30.0–36.0)
MCV: 85.2 fL (ref 78.0–100.0)
PLATELETS: 184 10*3/uL (ref 150–400)
RBC: 4.11 MIL/uL (ref 3.87–5.11)
RDW: 14.2 % (ref 11.5–15.5)
WBC: 13.9 10*3/uL — AB (ref 4.0–10.5)

## 2015-10-28 NOTE — MAU Provider Note (Signed)
History     CSN: [id number]  Arrival date and time: 10/28/15 2154   First Provider Initiated Contact with Patient 10/28/15 2227      Chief Complaint  Patient presents with  . Abdominal Pain  . Vaginal Bleeding   HPI  Ann Moore is a 25 y.o. G3P1011 at 6125w1d who presents with vaginal bleeding. [facility]; last scanned 5/30. Patient reports heavy bright red bleeding tonight that started at 6 pm. States bleeding soaked through her clothes & saturated pads. Reports some abdominal cramping. Denies n/v/d, constipation, or recent intercourse.   OB History    Gravida Para Term Preterm AB TAB SAB Ectopic Multiple Living   3 1 1  0 1 0 1 0 0 1      Past Medical History  Diagnosis Date  . Missed abortion 03/15/2014  . Pregnant 08/20/2015  . UTI (lower urinary tract infection) 08/20/2015  . Vaginal discharge 08/20/2015  . Headache   . Anxiety     Past Surgical History  Procedure Laterality Date  . Dilation and curettage of uterus      Family History  Problem Relation Age of Onset  . Hypertension Mother   . Diabetes Sister   . Diabetes Maternal Grandfather   . Cancer Maternal Grandfather     liver, lung    Social History  Substance Use Topics  . Smoking status: Former Smoker -- 0.00 packs/day for 7 years    Types: Cigarettes    Quit date: 08/28/2015  . Smokeless tobacco: Never Used     Comment: quit with preg confirmation  . Alcohol Use: No    Allergies:  Allergies  Allergen Reactions  . Hydrocodone Nausea And Vomiting and Rash    Prescriptions prior to admission  Medication Sig Dispense Refill Last Dose  . miconazole ([facility] MICONAZOLE 7) 2 % vaginal cream Place 1 Applicatorful vaginally at bedtime. 45 g 0   . prenatal vitamin w/FE, FA (PRENATAL 1 + 1) 27-1 MG TABS tablet Take 1 tablet by mouth daily at 12 noon. 30 each 11 Taking  . promethazine (PHENERGAN) 25 MG tablet Take 1 tablet (25 mg total) by mouth every 6  (six) hours as needed for nausea or vomiting. 30 tablet 1 Taking  . sertraline (ZOLOFT) 25 MG tablet Take 1 tablet (25 mg total) by mouth daily. (Patient not taking: Reported on 10/26/2015) 30 tablet 6 Not Taking    Review of Systems  Constitutional: Negative.   Gastrointestinal: Positive for abdominal pain. Negative for nausea, vomiting, diarrhea and constipation.  Genitourinary: Negative for dysuria.       + vaginal bleeding   Physical Exam   Blood pressure 121/82, pulse 89, temperature 98.4 F (36.9 C), temperature source Oral, resp. rate 16, height 5' 3.5" (1.613 m), weight 150 lb (68.04 kg), last menstrual period 05/25/2015.  Physical Exam  Nursing note and vitals reviewed. Constitutional: She is oriented to person, place, and time. She appears well-developed and well-nourished. No distress.  HENT:  Head: Normocephalic and atraumatic.  Eyes: Conjunctivae are normal. Right eye exhibits no discharge. Left eye exhibits no discharge. No scleral icterus.  Neck: Normal range of motion.  Cardiovascular: Normal rate, regular rhythm and normal heart sounds.   No murmur heard. Respiratory: Effort normal and breath sounds normal. No respiratory distress. She has no wheezes.  GI: Soft. There is no tenderness.  Genitourinary: There is bleeding (small amount of brown blood; no active  bleeding at this time) in the vagina.  Cervix closed  Neurological: She is alert and oriented to person, place, and time.  Skin: Skin is warm and dry. She is not diaphoretic.  Psychiatric: She has a normal mood and affect. Her behavior is normal. Judgment and thought content normal.    MAU Course  Procedures Results for orders placed or performed during the hospital encounter of 10/28/15 (from the past 24 hour(s))  Urinalysis, Routine w reflex microscopic (not at [facility])     Status: Abnormal   Collection Time: 10/28/15 10:05 PM  Result Value Ref Range   Color, Urine ORANGE (A) YELLOW   APPearance CLEAR CLEAR    Specific Gravity, Urine 1.010 1.005 - 1.030   pH 6.0 5.0 - 8.0   Glucose, UA NEGATIVE NEGATIVE mg/dL   Hgb urine dipstick LARGE (A) NEGATIVE   Bilirubin Urine NEGATIVE NEGATIVE   Ketones, ur NEGATIVE NEGATIVE mg/dL   Protein, ur 30 (A) NEGATIVE mg/dL   Nitrite NEGATIVE NEGATIVE   Leukocytes, UA SMALL (A) NEGATIVE  Urine microscopic-add on     Status: Abnormal   Collection Time: 10/28/15 10:05 PM  Result Value Ref Range   Squamous Epithelial / LPF 0-5 (A) NONE SEEN   WBC, UA 6-30 0 - 5 WBC/hpf   RBC / HPF TOO NUMEROUS TO COUNT 0 - 5 RBC/hpf   Bacteria, UA FEW (A) NONE SEEN  CBC     Status: Abnormal   Collection Time: 10/28/15 10:42 PM  Result Value Ref Range   WBC 13.9 (H) 4.0 - 10.5 K/uL   RBC 4.11 3.87 - 5.11 MIL/uL   Hemoglobin 12.2 12.0 - 15.0 g/dL   HCT 16.1 (L) 09.6 - 04.5 %   MCV 85.2 78.0 - 100.0 fL   MCH 29.7 26.0 - 34.0 pg   MCHC 34.9 30.0 - 36.0 g/dL   RDW 40.9 81.1 - 91.4 %   Platelets 184 150 - 400 K/uL   No results found.  MDM O positive Minimal blood on exam FHT 157 by doppler  Cervix closed  Ultrasound -- [facility] x 2 remain S/w Dr. Charlann Wayne Fulling. Ok to discharge home  Assessment and Plan  A: 1. Vaginal bleeding in pregnancy, second trimester   2. Subchorionic hematoma in second trimester    P; Discharge home Pelvic rest Discussed reasons to return to MAU Keep f/u with OB Judeth Horn 10/28/2015, 10:27 PM

## 2015-10-28 NOTE — MAU Note (Signed)
Patient presents at 6419 weeks gestation with c/o vaginal bleeding and abdominal pain since 1800 today. States she has been diagnosed with a subchorionic hemorrhage on her right side and was told by her OB that she now has one on the left. Notes flutters. Denies discharge.

## 2015-10-29 ENCOUNTER — Ambulatory Visit (INDEPENDENT_AMBULATORY_CARE_PROVIDER_SITE_OTHER): Payer: Medicaid Other | Admitting: Obstetrics and Gynecology

## 2015-10-29 ENCOUNTER — Encounter: Payer: Self-pay | Admitting: Obstetrics and Gynecology

## 2015-10-29 VITALS — BP 100/70 | HR 104 | Wt 150.0 lb

## 2015-10-29 DIAGNOSIS — O4692 Antepartum hemorrhage, unspecified, second trimester: Secondary | ICD-10-CM | POA: Diagnosis not present

## 2015-10-29 DIAGNOSIS — O43892 Other placental disorders, second trimester: Secondary | ICD-10-CM

## 2015-10-29 DIAGNOSIS — O0992 Supervision of high risk pregnancy, unspecified, second trimester: Secondary | ICD-10-CM

## 2015-10-29 NOTE — Progress Notes (Signed)
[facility] Visit  10/29/2015        Patient name: Ann Moore MRN [id number]  Date of birth: 12-Nov-1990  CC & HPI:  [name] is a 25 y.o. female presenting today for vaginal bleeding in pregnancy x 1 month. Pt states that she has had intermittent vaginal bleeding since her dx of [facility] 1 month ago. Pt reports that her last vaginal bleeding episode was last night and she had bright red blood that soaked through pads and she was seen at [facility]. Pt states that she had a miscarriage in 2015 that had similar symptoms to what she is experiencing. Pt denies any other symptoms. Lengthy discussion of risk Patient able to express her concerns, and we acknowledged that at present there are limitations on her ability to change natural process   ROS:  ROS  +Vaginal bleeding   Pertinent History Reviewed:   Reviewed: Significant for missed abortion Medical         Past Medical History  Diagnosis Date  . Missed abortion 03/15/2014  . Pregnant 08/20/2015  . UTI (lower urinary tract infection) 08/20/2015  . Vaginal discharge 08/20/2015  . Headache   . Anxiety                               Surgical Hx:    Past Surgical History  Procedure Laterality Date  . Dilation and curettage of uterus     Medications: Reviewed & Updated - see associated section                       Current outpatient prescriptions:  .  miconazole ([facility] MICONAZOLE 7) 2 % vaginal cream, Place 1 Applicatorful vaginally at bedtime., Disp: 45 g, Rfl: 0 .  prenatal vitamin w/FE, FA (PRENATAL 1 + 1) 27-1 MG TABS tablet, Take 1 tablet by mouth daily at 12 noon., Disp: 30 each, Rfl: 11 .  promethazine (PHENERGAN) 25 MG tablet, Take 1 tablet (25 mg total) by mouth every 6 (six) hours as needed for nausea or vomiting., Disp: 30 tablet, Rfl: 1 .  sertraline (ZOLOFT) 25 MG tablet, Take 1 tablet (25 mg total) by mouth daily. (Patient not taking: Reported on 10/26/2015), Disp: 30 tablet, Rfl: 6   Social History:  Reviewed -  reports that she quit smoking about 2 months ago. Her smoking use included Cigarettes. She smoked 0.00 packs per day for 7 years. She has never used smokeless tobacco.  Objective Findings:  Vitals: Blood pressure 100/70, pulse 104, weight 150 lb (68.04 kg), last menstrual period 05/25/2015.  Physical Examination: Pelvic - CERVIX: long, closed, normal appearing cervix without discharge or lesions, cervical discharge present - light brown, non-purulent  Discussed with pt risks and benefits of vaginal bleeding in pregnancy. At end of discussion, pt had opportunity to ask questions and has no further questions at this time.   Greater than 50% was spent in counseling and coordination of care with the patient. Total time greater than: 25 minutes   Assessment & Plan:   A:  1. [facility] Subchorionic hemorrhage, large, 2 2. Persistent Vaginal bleeding in pregnancy  P:  1. Follow up 2 weeks for high-risk OB   I personally performed the services described in this documentation, which was SCRIBED in my presence. The recorded information has been reviewed and considered accurate. It has been edited as necessary during review. Tilda Burrow, MD  By signing my name below, I, Soijett Blue, attest that this documentation has been prepared under the direction and in the presence of Tilda BurrowJohn Avien Taha V, MD. Electronically Signed: Soijett Blue, ED Scribe. 10/29/2015. 12:20 PM.

## 2015-10-29 NOTE — Discharge Instructions (Signed)
Pelvic Rest °Pelvic rest is sometimes recommended for women when:  °· The placenta is partially or completely covering the opening of the cervix (placenta previa). °· There is bleeding between the uterine wall and the amniotic sac in the first trimester (subchorionic hemorrhage). °· The cervix begins to open without labor starting (incompetent cervix, cervical insufficiency). °· The labor is too early (preterm labor). °HOME CARE INSTRUCTIONS °· Do not have sexual intercourse, stimulation, or an orgasm. °· Do not use tampons, douche, or put anything in the vagina. °· Do not lift anything over 10 pounds (4.5 kg). °· Avoid strenuous activity or straining your pelvic muscles. °SEEK MEDICAL CARE IF:  °· You have any vaginal bleeding during pregnancy. Treat this as a potential emergency. °· You have cramping pain felt low in the stomach (stronger than menstrual cramps). °· You notice vaginal discharge (watery, mucus, or bloody). °· You have a low, dull backache. °· There are regular contractions or uterine tightening. °SEEK IMMEDIATE MEDICAL CARE IF: °You have vaginal bleeding and have placenta previa.  °  °This information is not intended to replace advice given to you by your health care provider. Make sure you discuss any questions you have with your health care provider. °  °Document Released: 09/09/2010 Document Revised: 08/07/2011 Document Reviewed: 11/16/2014 °Elsevier Interactive Patient Education ©2016 Elsevier Inc. °Subchorionic Hematoma °A subchorionic hematoma is a gathering of blood between the outer wall of the placenta and the inner wall of the womb (uterus). The placenta is the organ that connects the fetus to the wall of the uterus. The placenta performs the feeding, breathing (oxygen to the fetus), and waste removal (excretory work) of the fetus.  °Subchorionic hematoma is the most common abnormality found on a result from ultrasonography done during the first trimester or early second trimester of  pregnancy. If there has been little or no vaginal bleeding, early small hematomas usually shrink on their own and do not affect your baby or pregnancy. The blood is gradually absorbed over 1-2 weeks. When bleeding starts later in pregnancy or the hematoma is larger or occurs in an older pregnant woman, the outcome may not be as good. Larger hematomas may get bigger, which increases the chances for miscarriage. Subchorionic hematoma also increases the risk of premature detachment of the placenta from the uterus, preterm (premature) labor, and stillbirth. °HOME CARE INSTRUCTIONS °· Stay on bed rest if your health care provider recommends this. Although bed rest will not prevent more bleeding or prevent a miscarriage, your health care provider may recommend bed rest until you are advised otherwise. °· Avoid heavy lifting (more than 10 lb [4.5 kg]), exercise, sexual intercourse, or douching as directed by your health care provider. °· Keep track of the number of pads you use each day and how soaked (saturated) they are. Write down this information. °· Do not use tampons. °· Keep all follow-up appointments as directed by your health care provider. Your health care provider may ask you to have follow-up blood tests or ultrasound tests or both. °SEEK IMMEDIATE MEDICAL CARE IF: °· You have severe cramps in your stomach, back, abdomen, or pelvis. °· You have a fever. °· You pass large clots or tissue. Save any tissue for your health care provider to look at. °· Your bleeding increases or you become lightheaded, feel weak, or have fainting episodes. °  °This information is not intended to replace advice given to you by your health care provider. Make sure you discuss any questions you have with   your health care provider. °  °Document Released: 08/30/2006 Document Revised: 06/05/2014 Document Reviewed: 12/12/2012 °Elsevier Interactive Patient Education ©2016 Elsevier Inc. ° °

## 2015-10-29 NOTE — Progress Notes (Signed)
Vaginal bleeding. Pt states that she is having bright red bleeding.

## 2015-11-09 ENCOUNTER — Ambulatory Visit (INDEPENDENT_AMBULATORY_CARE_PROVIDER_SITE_OTHER): Payer: Medicaid Other | Admitting: Women's Health

## 2015-11-09 ENCOUNTER — Encounter: Payer: Self-pay | Admitting: Women's Health

## 2015-11-09 ENCOUNTER — Ambulatory Visit (INDEPENDENT_AMBULATORY_CARE_PROVIDER_SITE_OTHER): Payer: Medicaid Other

## 2015-11-09 VITALS — BP 114/70 | HR 100 | Wt 151.0 lb

## 2015-11-09 DIAGNOSIS — O43892 Other placental disorders, second trimester: Secondary | ICD-10-CM | POA: Diagnosis not present

## 2015-11-09 DIAGNOSIS — O418X20 Other specified disorders of amniotic fluid and membranes, second trimester, not applicable or unspecified: Secondary | ICD-10-CM

## 2015-11-09 DIAGNOSIS — Z3A21 21 weeks gestation of pregnancy: Secondary | ICD-10-CM

## 2015-11-09 DIAGNOSIS — Z1389 Encounter for screening for other disorder: Secondary | ICD-10-CM

## 2015-11-09 DIAGNOSIS — O368920 Maternal care for other specified fetal problems, second trimester, not applicable or unspecified: Secondary | ICD-10-CM

## 2015-11-09 DIAGNOSIS — Z331 Pregnant state, incidental: Secondary | ICD-10-CM

## 2015-11-09 DIAGNOSIS — O468X2 Other antepartum hemorrhage, second trimester: Secondary | ICD-10-CM

## 2015-11-09 DIAGNOSIS — O321XX1 Maternal care for breech presentation, fetus 1: Secondary | ICD-10-CM | POA: Diagnosis not present

## 2015-11-09 DIAGNOSIS — Z3482 Encounter for supervision of other normal pregnancy, second trimester: Secondary | ICD-10-CM

## 2015-11-09 DIAGNOSIS — O0992 Supervision of high risk pregnancy, unspecified, second trimester: Secondary | ICD-10-CM

## 2015-11-09 NOTE — Patient Instructions (Signed)

## 2015-11-09 NOTE — Progress Notes (Signed)
Low-risk OB appointment G3P1011 3577w5d Estimated Date of Delivery: 03/23/16 BP 114/70 mmHg  Pulse 100  Wt 151 lb (68.493 kg)  LMP 05/25/2015 (Approximate)  BP, weight, reviewed. Unable to void. Refer to obstetrical flow sheet for FH & FHR.  Reports good fm.  Denies regular uc's, lof, or uti s/s.  Same occ spotting. Occ Lt leg numbness, no shooting pains down leg c/w sciatica- try not to cross legs.  [facility] smaller: 5.3x1.9x5.5 and 3x.7x3.1cm, CP cyst resolved. Plan:  Continue routine obstetrical care  F/U in 2wks for OB appointment and [facility]

## 2015-11-09 NOTE — Progress Notes (Signed)
US 20+5 wks,measurements c/w dates,SCH appear to be smaller on today's ultrasound,SCH ant 5.3 x 1.9 x 5.5 cm,post fundal 3 x .7 x 3.1 cm,cx 3.7,ant pl gr 0,normal ov's bilat,breech,fhr 141 bpm,svp of fluid 4.2 cm,choroid plexus cyst was not seen on today's US.

## 2015-11-15 ENCOUNTER — Telehealth: Payer: Self-pay | Admitting: Women's Health

## 2015-11-15 NOTE — Telephone Encounter (Signed)
Pt states she is wanting to get a job where her boyfriend works but wanted to make sure it was OK for her to work.  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  I informed pt that unless she was told specifically not to work due to a medical condition then it is fine to get a job.  Pt verbalized understanding.

## 2015-11-23 ENCOUNTER — Encounter: Payer: Self-pay | Admitting: Women's Health

## 2015-11-23 ENCOUNTER — Ambulatory Visit (INDEPENDENT_AMBULATORY_CARE_PROVIDER_SITE_OTHER): Payer: Medicaid Other

## 2015-11-23 ENCOUNTER — Ambulatory Visit (INDEPENDENT_AMBULATORY_CARE_PROVIDER_SITE_OTHER): Payer: Medicaid Other | Admitting: Women's Health

## 2015-11-23 VITALS — BP 114/68 | HR 68 | Wt 155.0 lb

## 2015-11-23 DIAGNOSIS — R82998 Other abnormal findings in urine: Secondary | ICD-10-CM

## 2015-11-23 DIAGNOSIS — O321XX1 Maternal care for breech presentation, fetus 1: Secondary | ICD-10-CM

## 2015-11-23 DIAGNOSIS — O0992 Supervision of high risk pregnancy, unspecified, second trimester: Secondary | ICD-10-CM

## 2015-11-23 DIAGNOSIS — Z3492 Encounter for supervision of normal pregnancy, unspecified, second trimester: Secondary | ICD-10-CM

## 2015-11-23 DIAGNOSIS — O368920 Maternal care for other specified fetal problems, second trimester, not applicable or unspecified: Secondary | ICD-10-CM | POA: Diagnosis not present

## 2015-11-23 DIAGNOSIS — Z3A23 23 weeks gestation of pregnancy: Secondary | ICD-10-CM | POA: Diagnosis not present

## 2015-11-23 DIAGNOSIS — Z1389 Encounter for screening for other disorder: Secondary | ICD-10-CM

## 2015-11-23 DIAGNOSIS — O468X2 Other antepartum hemorrhage, second trimester: Secondary | ICD-10-CM

## 2015-11-23 DIAGNOSIS — O418X20 Other specified disorders of amniotic fluid and membranes, second trimester, not applicable or unspecified: Secondary | ICD-10-CM

## 2015-11-23 DIAGNOSIS — N390 Urinary tract infection, site not specified: Secondary | ICD-10-CM

## 2015-11-23 DIAGNOSIS — Z331 Pregnant state, incidental: Secondary | ICD-10-CM

## 2015-11-23 LAB — POCT URINALYSIS DIPSTICK
Glucose, UA: NEGATIVE
Ketones, UA: NEGATIVE
Nitrite, UA: NEGATIVE
Protein, UA: NEGATIVE

## 2015-11-23 NOTE — Addendum Note (Signed)
Addended by: Gaylyn RongEVANS, Dempsey Knotek A on: 11/23/2015 02:20 PM   Modules accepted: Orders

## 2015-11-23 NOTE — Progress Notes (Signed)
Low-risk OB appointment G3P1011 932w5d Estimated Date of Delivery: 03/23/16 BP 114/68 mmHg  Pulse 68  Wt 155 lb (70.308 kg)  LMP 05/25/2015 (Approximate)  BP, weight, and urine reviewed.  Refer to obstetrical flow sheet for FH & FHR.  Reports good fm.  Denies regular uc's, lof, or uti s/s. Spotting is only randomly throughout the day and sometimes only every other day now, so improving. States JVF had discussed BMZ @ 24wks- confirmed this w/ him today. So will bring her back on 7/6 & 7/7 for BMZ.  Reviewed today's u[facility]- both stable w/o change, efw 38%, SVP 5cm. Per [facility] if still spotting to repeat u/s in 4wks. Discussed ptl s/s, fm. If bleeding picks up to call us/go to [facility].  Plan:  Continue routine obstetrical care  F/U on 7/6 & 7/7 for BMZ w/ nurse, then 4wks for OB appointment, pn2, and [facility]

## 2015-11-23 NOTE — Progress Notes (Signed)
US 22+5 wks,breech,ant and post fundal SCH no significant change,normal ov's bilat, ant pl gr 0, fhr 148 bpm,svp of fluid 5 cm,efw 516 g 38 %

## 2015-11-23 NOTE — Patient Instructions (Addendum)
You will have your sugar test next visit.  Please do not eat or drink anything after midnight the night before you come, not even water.  You will be here for at least two hours.     For Dizzy Spells:   This is usually related to either your blood sugar or your blood pressure dropping  Make sure you are staying well hydrated and drinking enough water so that your urine is clear  Eat small frequent meals and snacks containing protein (meat, eggs, nuts, cheese) so that your blood sugar doesn't drop  If you do get dizzy, sit/lay down and get you something to drink and a snack containing protein- you will usually start feeling better in 10-20 minutes     Call the office [phone]) or [facility] if:  You begin to have strong, frequent contractions  Your water breaks.  Sometimes it is a big gush of fluid, sometimes it is just a trickle that keeps getting your panties wet or running down your legs  You have vaginal bleeding.  It is normal to have a small amount of spotting if your cervix was checked.   You don't feel your baby moving like normal.  If you don't, get you something to eat and drink and lay down and focus on feeling your baby move.   If your baby is still not moving like normal, you should call the office or [facility].  Second Trimester of Pregnancy The second trimester is from week 13 through week 28, months 4 through 6. The second trimester is often a time when you feel your best. Your body has also adjusted to being pregnant, and you begin to feel better physically. Usually, morning sickness has lessened or quit completely, you may have more energy, and you may have an increase in appetite. The second trimester is also a time when the fetus is growing rapidly. At the end of the sixth month, the fetus is about 9 inches long and weighs about 1 pounds. You will likely begin to feel the baby move (quickening) between 18 and 20 weeks of the pregnancy. BODY  CHANGES Your body goes through many changes during pregnancy. The changes vary from woman to woman.  9. Your weight will continue to increase. You will notice your lower abdomen bulging out. 10. You may begin to get stretch marks on your hips, abdomen, and breasts. 11. You may develop headaches that can be relieved by medicines approved by your health care provider. 12. You may urinate more often because the fetus is pressing on your bladder. 13. You may develop or continue to have heartburn as a result of your pregnancy. 14. You may develop constipation because certain hormones are causing the muscles that push waste through your intestines to slow down. 15. You may develop hemorrhoids or swollen, bulging veins (varicose veins). 16. You may have back pain because of the weight gain and pregnancy hormones relaxing your joints between the bones in your pelvis and as a result of a shift in weight and the muscles that support your balance. 17. Your breasts will continue to grow and be tender. 18. Your gums may bleed and may be sensitive to brushing and flossing. 19. Dark spots or blotches (chloasma, mask of pregnancy) may develop on your face. This will likely fade after the baby is born. 20. A dark line from your belly button to the pubic area (linea nigra) may appear. This will likely fade after the  baby is born. 21. You may have changes in your hair. These can include thickening of your hair, rapid growth, and changes in texture. Some women also have hair loss during or after pregnancy, or hair that feels dry or thin. Your hair will most likely return to normal after your baby is born. WHAT TO EXPECT AT YOUR PRENATAL VISITS During a routine prenatal visit:  You will be weighed to make sure you and the fetus are growing normally.  Your blood pressure will be taken.  Your abdomen will be measured to track your baby's growth.  The fetal heartbeat will be listened to.  Any test results from the  previous visit will be discussed. Your health care provider may ask you:  How you are feeling.  If you are feeling the baby move.  If you have had any abnormal symptoms, such as leaking fluid, bleeding, severe headaches, or abdominal cramping.  If you have any questions. Other tests that may be performed during your second trimester include:  Blood tests that check for:  Low iron levels (anemia).  Gestational diabetes (between 24 and 28 weeks).  Rh antibodies.  Urine tests to check for infections, diabetes, or protein in the urine.  An ultrasound to confirm the proper growth and development of the baby.  An amniocentesis to check for possible genetic problems.  Fetal screens for spina bifida and Down syndrome. HOME CARE INSTRUCTIONS   Avoid all smoking, herbs, alcohol, and unprescribed drugs. These chemicals affect the formation and growth of the baby.  Follow your health care provider's instructions regarding medicine use. There are medicines that are either safe or unsafe to take during pregnancy.  Exercise only as directed by your health care provider. Experiencing uterine cramps is a good sign to stop exercising.  Continue to eat regular, healthy meals.  Wear a good support bra for breast tenderness.  Do not use hot tubs, steam rooms, or saunas.  Wear your seat belt at all times when driving.  Avoid raw meat, uncooked cheese, cat litter boxes, and soil used by cats. These carry germs that can cause birth defects in the baby.  Take your prenatal vitamins.  Try taking a stool softener (if your health care provider approves) if you develop constipation. Eat more high-fiber foods, such as fresh vegetables or fruit and whole grains. Drink plenty of fluids to keep your urine clear or pale yellow.  Take warm sitz baths to soothe any pain or discomfort caused by hemorrhoids. Use hemorrhoid cream if your health care provider approves.  If you develop varicose veins, wear  support hose. Elevate your feet for 15 minutes, 3-4 times a day. Limit salt in your diet.  Avoid heavy lifting, wear low heel shoes, and practice good posture.  Rest with your legs elevated if you have leg cramps or low back pain.  Visit your dentist if you have not gone yet during your pregnancy. Use a soft toothbrush to brush your teeth and be gentle when you floss.  A sexual relationship may be continued unless your health care provider directs you otherwise.  Continue to go to all your prenatal visits as directed by your health care provider. SEEK MEDICAL CARE IF:   You have dizziness.  You have mild pelvic cramps, pelvic pressure, or nagging pain in the abdominal area.  You have persistent nausea, vomiting, or diarrhea.  You have a bad smelling vaginal discharge.  You have pain with urination. SEEK IMMEDIATE MEDICAL CARE IF:   You   have a fever.  You are leaking fluid from your vagina.  You have spotting or bleeding from your vagina.  You have severe abdominal cramping or pain.  You have rapid weight gain or loss.  You have shortness of breath with chest pain.  You notice sudden or extreme swelling of your face, hands, ankles, feet, or legs.  You have not felt your baby move in over an hour.  You have severe headaches that do not go away with medicine.  You have vision changes. Document Released: 05/09/2001 Document Revised: 05/20/2013 Document Reviewed: 07/16/2012 ExitCare Patient Information 2015 [employer]. This information is not intended to replace advice given to you by your health care provider. Make sure you discuss any questions you have with your health care provider.     

## 2015-11-25 LAB — URINE CULTURE

## 2015-12-02 ENCOUNTER — Encounter: Payer: Self-pay | Admitting: *Deleted

## 2015-12-02 ENCOUNTER — Ambulatory Visit (INDEPENDENT_AMBULATORY_CARE_PROVIDER_SITE_OTHER): Payer: Medicaid Other | Admitting: *Deleted

## 2015-12-02 VITALS — BP 120/70 | HR 94 | Ht 64.0 in | Wt 161.0 lb

## 2015-12-02 DIAGNOSIS — O368920 Maternal care for other specified fetal problems, second trimester, not applicable or unspecified: Secondary | ICD-10-CM | POA: Diagnosis not present

## 2015-12-02 DIAGNOSIS — O468X2 Other antepartum hemorrhage, second trimester: Principal | ICD-10-CM

## 2015-12-02 DIAGNOSIS — O0992 Supervision of high risk pregnancy, unspecified, second trimester: Secondary | ICD-10-CM

## 2015-12-02 DIAGNOSIS — O418X20 Other specified disorders of amniotic fluid and membranes, second trimester, not applicable or unspecified: Secondary | ICD-10-CM

## 2015-12-02 MED ORDER — BETAMETHASONE SOD PHOS & ACET 6 (3-3) MG/ML IJ SUSP
12.0000 mg | Freq: Once | INTRAMUSCULAR | Status: AC
  Administered 2015-12-02: 12 mg via INTRAMUSCULAR

## 2015-12-02 NOTE — Progress Notes (Signed)
Patient ID: Ann PiedraCourtney D Scarpulla, female   DOB: 06/17/1990, 25 y.o.   MRN: [id number] Pt given BMZ and tolerated well.  Pt to come back tomorrow for 2nd injection.

## 2015-12-03 ENCOUNTER — Ambulatory Visit (INDEPENDENT_AMBULATORY_CARE_PROVIDER_SITE_OTHER): Payer: Medicaid Other | Admitting: *Deleted

## 2015-12-03 ENCOUNTER — Encounter: Payer: Self-pay | Admitting: *Deleted

## 2015-12-03 VITALS — BP 108/60 | HR 102 | Ht 63.5 in | Wt 162.0 lb

## 2015-12-03 DIAGNOSIS — O368920 Maternal care for other specified fetal problems, second trimester, not applicable or unspecified: Secondary | ICD-10-CM | POA: Diagnosis not present

## 2015-12-03 DIAGNOSIS — O468X2 Other antepartum hemorrhage, second trimester: Principal | ICD-10-CM

## 2015-12-03 DIAGNOSIS — O0992 Supervision of high risk pregnancy, unspecified, second trimester: Secondary | ICD-10-CM

## 2015-12-03 DIAGNOSIS — O418X20 Other specified disorders of amniotic fluid and membranes, second trimester, not applicable or unspecified: Secondary | ICD-10-CM

## 2015-12-03 MED ORDER — BETAMETHASONE SOD PHOS & ACET 6 (3-3) MG/ML IJ SUSP
12.0000 mg | Freq: Once | INTRAMUSCULAR | Status: AC
  Administered 2015-12-03: 12 mg via INTRAMUSCULAR

## 2015-12-03 NOTE — Progress Notes (Signed)
Pt here for 2nd Betamethasone 12 mg injection. Pt tolerated shot well. Return at scheduled appt. JSY

## 2015-12-20 ENCOUNTER — Other Ambulatory Visit: Payer: Self-pay | Admitting: Obstetrics and Gynecology

## 2015-12-20 DIAGNOSIS — O418X20 Other specified disorders of amniotic fluid and membranes, second trimester, not applicable or unspecified: Secondary | ICD-10-CM

## 2015-12-20 DIAGNOSIS — O468X2 Other antepartum hemorrhage, second trimester: Principal | ICD-10-CM

## 2015-12-20 DIAGNOSIS — O4692 Antepartum hemorrhage, unspecified, second trimester: Secondary | ICD-10-CM

## 2015-12-21 ENCOUNTER — Other Ambulatory Visit: Payer: Medicaid Other

## 2015-12-21 ENCOUNTER — Ambulatory Visit (INDEPENDENT_AMBULATORY_CARE_PROVIDER_SITE_OTHER): Payer: Medicaid Other

## 2015-12-21 ENCOUNTER — Ambulatory Visit (INDEPENDENT_AMBULATORY_CARE_PROVIDER_SITE_OTHER): Payer: Medicaid Other | Admitting: Obstetrics & Gynecology

## 2015-12-21 VITALS — BP 118/72 | HR 88 | Wt 170.0 lb

## 2015-12-21 DIAGNOSIS — O468X3 Other antepartum hemorrhage, third trimester: Secondary | ICD-10-CM | POA: Diagnosis not present

## 2015-12-21 DIAGNOSIS — O468X2 Other antepartum hemorrhage, second trimester: Principal | ICD-10-CM

## 2015-12-21 DIAGNOSIS — Z3A27 27 weeks gestation of pregnancy: Secondary | ICD-10-CM | POA: Diagnosis not present

## 2015-12-21 DIAGNOSIS — O321XX1 Maternal care for breech presentation, fetus 1: Secondary | ICD-10-CM

## 2015-12-21 DIAGNOSIS — Z1389 Encounter for screening for other disorder: Secondary | ICD-10-CM

## 2015-12-21 DIAGNOSIS — O4692 Antepartum hemorrhage, unspecified, second trimester: Secondary | ICD-10-CM

## 2015-12-21 DIAGNOSIS — O43892 Other placental disorders, second trimester: Secondary | ICD-10-CM

## 2015-12-21 DIAGNOSIS — Z369 Encounter for antenatal screening, unspecified: Secondary | ICD-10-CM

## 2015-12-21 DIAGNOSIS — O418X20 Other specified disorders of amniotic fluid and membranes, second trimester, not applicable or unspecified: Secondary | ICD-10-CM

## 2015-12-21 DIAGNOSIS — Z131 Encounter for screening for diabetes mellitus: Secondary | ICD-10-CM

## 2015-12-21 DIAGNOSIS — Z3492 Encounter for supervision of normal pregnancy, unspecified, second trimester: Secondary | ICD-10-CM

## 2015-12-21 DIAGNOSIS — Z3482 Encounter for supervision of other normal pregnancy, second trimester: Secondary | ICD-10-CM

## 2015-12-21 DIAGNOSIS — Z331 Pregnant state, incidental: Secondary | ICD-10-CM

## 2015-12-21 LAB — POCT URINALYSIS DIPSTICK
Glucose, UA: NEGATIVE
Ketones, UA: NEGATIVE
Leukocytes, UA: NEGATIVE
NITRITE UA: NEGATIVE
Protein, UA: NEGATIVE
RBC UA: NEGATIVE

## 2015-12-21 NOTE — Progress Notes (Signed)
OB F/U today at 26+[redacted] weeks GA. Single active fetus with FHR 141. EFW today 1217g which is in 76.2% and consistent with EDD set by early ultrasound. Cervix closed and measures 3.6cm. Single fluid pocket 7.8cm and is subjectively WNL. Bilateral ovaries WNL. All anatomy previously visualized and appeared WNL. Fetus in breech position. [facility] ant and post both smaller in size since last ultrasound.

## 2015-12-21 NOTE — Progress Notes (Signed)
[id number] [redacted]w[redacted]d Estimated Date of Delivery: 03/23/16  Blood pressure 118/72, pulse 88, weight 170 lb (77.1 kg), last menstrual period 05/25/2015.   BP weight and urine results all reviewed and noted.  Please refer to the obstetrical flow sheet for the fundal height and fetal heart rate documentation:  Patient reports good fetal movement, denies any bleeding and no rupture of membranes symptoms or regular contractions. Patient is without complaints. All questions were answered.  Orders Placed This Encounter  Procedures  . POCT urinalysis dipstick    Plan:  Continued routine obstetrical care, PN2 today  Return in about 3 weeks (around 01/11/2016) for LROB.

## 2015-12-22 LAB — CBC
HEMATOCRIT: 36.5 % (ref 34.0–46.6)
HEMOGLOBIN: 11.8 g/dL (ref 11.1–15.9)
MCH: 28.8 pg (ref 26.6–33.0)
MCHC: 32.3 g/dL (ref 31.5–35.7)
MCV: 89 fL (ref 79–97)
Platelets: 187 10*3/uL (ref 150–379)
RBC: 4.1 x10E6/uL (ref 3.77–5.28)
RDW: 13.6 % (ref 12.3–15.4)
WBC: 10.8 10*3/uL (ref 3.4–10.8)

## 2015-12-22 LAB — GLUCOSE TOLERANCE, 2 HOURS W/ 1HR
GLUCOSE, 1 HOUR: 83 mg/dL (ref 65–179)
GLUCOSE, FASTING: 70 mg/dL (ref 65–91)
Glucose, 2 hour: 67 mg/dL (ref 65–152)

## 2015-12-22 LAB — RPR: RPR Ser Ql: NONREACTIVE

## 2015-12-22 LAB — ANTIBODY SCREEN: ANTIBODY SCREEN: NEGATIVE

## 2015-12-22 LAB — HIV ANTIBODY (ROUTINE TESTING W REFLEX): HIV SCREEN 4TH GENERATION: NONREACTIVE

## 2015-12-23 ENCOUNTER — Telehealth: Payer: Self-pay | Admitting: Advanced Practice Midwife

## 2015-12-23 NOTE — Telephone Encounter (Addendum)
Pt c/o slight redness with bumps on face only this am, feels like allergic reaction but has not taken any new meds, soap, detergent, etc.   Pt advise to take the OTC Benadryl 25 mg po, if no improvement call our office back. Pt verbalized understanding.

## 2016-01-03 ENCOUNTER — Telehealth: Payer: Self-pay | Admitting: Obstetrics & Gynecology

## 2016-01-03 NOTE — Telephone Encounter (Signed)
Pt states needs a note for work stating no lifting greater than 25 lbs.  Note faxed to [phone] per pt request.

## 2016-01-07 ENCOUNTER — Encounter: Payer: Self-pay | Admitting: Obstetrics and Gynecology

## 2016-01-07 ENCOUNTER — Ambulatory Visit (INDEPENDENT_AMBULATORY_CARE_PROVIDER_SITE_OTHER): Payer: Medicaid Other | Admitting: Obstetrics and Gynecology

## 2016-01-07 ENCOUNTER — Telehealth: Payer: Self-pay | Admitting: Obstetrics & Gynecology

## 2016-01-07 VITALS — BP 120/80 | HR 112 | Wt 175.0 lb

## 2016-01-07 DIAGNOSIS — Z3493 Encounter for supervision of normal pregnancy, unspecified, third trimester: Secondary | ICD-10-CM

## 2016-01-07 DIAGNOSIS — Z331 Pregnant state, incidental: Secondary | ICD-10-CM

## 2016-01-07 DIAGNOSIS — Z3A30 30 weeks gestation of pregnancy: Secondary | ICD-10-CM

## 2016-01-07 DIAGNOSIS — Z1389 Encounter for screening for other disorder: Secondary | ICD-10-CM

## 2016-01-07 DIAGNOSIS — Z3483 Encounter for supervision of other normal pregnancy, third trimester: Secondary | ICD-10-CM

## 2016-01-07 NOTE — Telephone Encounter (Signed)
Pt c/o pressure lower abdomen and vaginal, was at work and had to leave, states been dealing with this for a week. Pt requesting to be seen. Pt given an appt today for 12:45 pm with Dr. Emelda FearFerguson.

## 2016-01-07 NOTE — Progress Notes (Signed)
Pt worked in today for abdominal pain and lots of pressure that comes and goes.

## 2016-01-07 NOTE — Telephone Encounter (Signed)
Pt called stating that she is having a lot of pressure and would like a call back from a nurse. Please contact pt

## 2016-01-07 NOTE — Progress Notes (Signed)
Patient ID: Ann PiedraCourtney D Mosher, female   DOB: 01/31/1991, 25 y.o.   MRN: [id number]  [place]  [id number]  Estimated Date of Delivery: 03/23/16 LROB 5069w1d  Blood pressure 120/80, pulse (!) 112, weight 175 lb (79.4 kg), last menstrual period 05/25/2015.    Urine results: not done  Chief Complaint  Patient presents with  . Abdominal Pain    and lots of pressure that comes and goes    Patient complaints: Moderate, intermittent, lower abdominal pain and pressure for a week. Pt states she does not remember similar pressure with her last pregnancy. She denies any sharp, shooting vaginal pains.   Patient reports good fetal movement. She denies any bleeding, rupture of membranes, or regular contractions.   Refer to the ob flow sheet for FH and FHR.    Physical Examination: General appearance - alert, well appearing, and in no distress                                      Abdomen - FH 29 cm,                                                         -FHR 136 bpm                                                         soft, nontender, nondistended, no masses or organomegaly                                      Pelvic - VULVA: normal appearing vulva with no masses, tenderness or lesions,      VAGINA: normal appearing vagina with normal color, no lesions, moderately increased discharge      CERVIX: normal appearing cervix without lesions, long, closed, no unusual discharge, posterior                                             Questions were answered. Assessment: LROB G3P1011 @ 3069w1d Intermittent lower abdominal pressure  No premature changes of concern on exam    Plan:  Continued routine obstetrical care  F/u in 3 weeks for routine prenatal care   By signing my name below, I, Doreatha MartinEva Mathews, attest that this documentation has been prepared under the direction and in the presence of Tilda BurrowJohn V Symia Herdt, MD. Electronically Signed: Doreatha MartinEva Mathews, ED Scribe. 01/07/16. 12:54 PM.  I personally performed the  services described in this documentation, which was SCRIBED in my presence. The recorded information has been reviewed and considered accurate. It has been edited as necessary during review. Tilda BurrowFERGUSON,Mcguire Gasparyan V, MD

## 2016-01-11 ENCOUNTER — Encounter: Payer: Medicaid Other | Admitting: Advanced Practice Midwife

## 2016-02-01 ENCOUNTER — Ambulatory Visit (INDEPENDENT_AMBULATORY_CARE_PROVIDER_SITE_OTHER): Payer: Medicaid Other | Admitting: Women's Health

## 2016-02-01 ENCOUNTER — Encounter: Payer: Self-pay | Admitting: Women's Health

## 2016-02-01 DIAGNOSIS — Z1389 Encounter for screening for other disorder: Secondary | ICD-10-CM

## 2016-02-01 DIAGNOSIS — Z3493 Encounter for supervision of normal pregnancy, unspecified, third trimester: Secondary | ICD-10-CM

## 2016-02-01 DIAGNOSIS — Z331 Pregnant state, incidental: Secondary | ICD-10-CM

## 2016-02-01 DIAGNOSIS — F1290 Cannabis use, unspecified, uncomplicated: Secondary | ICD-10-CM

## 2016-02-01 LAB — POCT URINALYSIS DIPSTICK
Blood, UA: NEGATIVE
GLUCOSE UA: NEGATIVE
Ketones, UA: NEGATIVE
Nitrite, UA: NEGATIVE
Protein, UA: NEGATIVE

## 2016-02-01 NOTE — Patient Instructions (Signed)

## 2016-02-01 NOTE — Progress Notes (Signed)
Low-risk OB appointment G3P1011 1423w5d Estimated Date of Delivery: 03/23/16 LMP 05/25/2015 (Approximate)   BP, weight, and urine reviewed.  Refer to obstetrical flow sheet for FH & FHR.  Reports good fm.  Denies regular uc's, lof, vb, or uti s/s. No complaints. Reviewed normal pn2 results, ptl s/s, fkc. Recommended Tdap at HD/PCP per CDC guidelines.  Plan:  Continue routine obstetrical care  F/U in 2wks for OB appointment

## 2016-02-03 LAB — PMP SCREEN PROFILE (10S), URINE
AMPHETAMINE SCRN UR: NEGATIVE ng/mL
BARBITURATE SCRN UR: NEGATIVE ng/mL
Benzodiazepine Screen, Urine: NEGATIVE ng/mL
COCAINE(METAB.) SCREEN, URINE: NEGATIVE ng/mL
Cannabinoids Ur Ql Scn: NEGATIVE ng/mL
Creatinine(Crt), U: 13.6 mg/dL — ABNORMAL LOW (ref 20.0–300.0)
METHADONE SCREEN, URINE: NEGATIVE ng/mL
OPIATE SCRN UR: NEGATIVE ng/mL
OXYCODONE+OXYMORPHONE UR QL SCN: NEGATIVE ng/mL
PCP SCRN UR: NEGATIVE ng/mL
PROPOXYPHENE SCREEN: NEGATIVE ng/mL
Ph of Urine: 6.3 (ref 4.5–8.9)

## 2016-02-03 LAB — SPECIFIC GRAVITY: SPECIFIC GRAVITY: 1.0021

## 2016-02-15 ENCOUNTER — Ambulatory Visit (INDEPENDENT_AMBULATORY_CARE_PROVIDER_SITE_OTHER): Payer: Medicaid Other | Admitting: Obstetrics & Gynecology

## 2016-02-15 ENCOUNTER — Encounter: Payer: Self-pay | Admitting: Obstetrics & Gynecology

## 2016-02-15 VITALS — BP 120/80 | HR 80 | Wt 179.3 lb

## 2016-02-15 DIAGNOSIS — O3663X1 Maternal care for excessive fetal growth, third trimester, fetus 1: Secondary | ICD-10-CM | POA: Diagnosis not present

## 2016-02-15 DIAGNOSIS — Z3493 Encounter for supervision of normal pregnancy, unspecified, third trimester: Secondary | ICD-10-CM

## 2016-02-15 DIAGNOSIS — Z3483 Encounter for supervision of other normal pregnancy, third trimester: Secondary | ICD-10-CM

## 2016-02-15 DIAGNOSIS — IMO0002 Reserved for concepts with insufficient information to code with codable children: Secondary | ICD-10-CM

## 2016-02-15 DIAGNOSIS — Z331 Pregnant state, incidental: Secondary | ICD-10-CM

## 2016-02-15 DIAGNOSIS — Z3A35 35 weeks gestation of pregnancy: Secondary | ICD-10-CM | POA: Diagnosis not present

## 2016-02-15 DIAGNOSIS — Z1389 Encounter for screening for other disorder: Secondary | ICD-10-CM

## 2016-02-15 LAB — POCT URINALYSIS DIPSTICK
Blood, UA: NEGATIVE
Glucose, UA: NEGATIVE
KETONES UA: NEGATIVE
Nitrite, UA: NEGATIVE
PROTEIN UA: NEGATIVE

## 2016-02-15 NOTE — Progress Notes (Signed)
[id number] 838w5d Estimated Date of Delivery: 03/23/16  Blood pressure 120/80, pulse 80, weight 179 lb 4.8 oz (81.3 kg), last menstrual period 05/25/2015.   BP weight and urine results all reviewed and noted.  Please refer to the obstetrical flow sheet for the fundal height and fetal heart rate documentation:  Patient reports good fetal movement, denies any bleeding and no rupture of membranes symptoms or regular contractions. Patient is without complaints. All questions were answered.  Orders Placed This Encounter  Procedures  . US OB Follow Up  . POCT urinalysis dipstick    Plan:  Continued routine obstetrical care, FH a little large, will do sonogram next visit  Return in about 2 weeks (around 02/29/2016) for Sonogram fetal weight, , LROB, with Dr Despina HiddenEure.

## 2016-02-19 ENCOUNTER — Inpatient Hospital Stay (HOSPITAL_COMMUNITY): Payer: Medicaid Other | Admitting: Anesthesiology

## 2016-02-19 ENCOUNTER — Inpatient Hospital Stay (HOSPITAL_COMMUNITY)
Admission: AD | Admit: 2016-02-19 | Discharge: 2016-02-22 | DRG: 767 | Disposition: A | Discharge status: Home / Self Care | Payer: Medicaid Other | Source: Ambulatory Visit | Attending: Obstetrics and Gynecology | Admitting: Obstetrics and Gynecology

## 2016-02-19 ENCOUNTER — Encounter (HOSPITAL_COMMUNITY): Payer: Self-pay | Admitting: Certified Nurse Midwife

## 2016-02-19 DIAGNOSIS — Z8249 Family history of ischemic heart disease and other diseases of the circulatory system: Secondary | ICD-10-CM | POA: Diagnosis not present

## 2016-02-19 DIAGNOSIS — Z3A35 35 weeks gestation of pregnancy: Secondary | ICD-10-CM

## 2016-02-19 DIAGNOSIS — Z87891 Personal history of nicotine dependence: Secondary | ICD-10-CM | POA: Diagnosis not present

## 2016-02-19 DIAGNOSIS — O42913 Preterm premature rupture of membranes, unspecified as to length of time between rupture and onset of labor, third trimester: Secondary | ICD-10-CM | POA: Diagnosis present

## 2016-02-19 DIAGNOSIS — Z833 Family history of diabetes mellitus: Secondary | ICD-10-CM

## 2016-02-19 DIAGNOSIS — IMO0001 Reserved for inherently not codable concepts without codable children: Secondary | ICD-10-CM

## 2016-02-19 DIAGNOSIS — O42011 Preterm premature rupture of membranes, onset of labor within 24 hours of rupture, first trimester: Secondary | ICD-10-CM | POA: Diagnosis present

## 2016-02-19 DIAGNOSIS — O42013 Preterm premature rupture of membranes, onset of labor within 24 hours of rupture, third trimester: Principal | ICD-10-CM | POA: Diagnosis present

## 2016-02-19 LAB — TYPE AND SCREEN
ABO/RH(D): O POS
Antibody Screen: NEGATIVE

## 2016-02-19 LAB — URINALYSIS, ROUTINE W REFLEX MICROSCOPIC
BILIRUBIN URINE: NEGATIVE
GLUCOSE, UA: NEGATIVE mg/dL
Hgb urine dipstick: NEGATIVE
KETONES UR: NEGATIVE mg/dL
LEUKOCYTES UA: NEGATIVE
Nitrite: NEGATIVE
PH: 7 (ref 5.0–8.0)
Protein, ur: NEGATIVE mg/dL
Specific Gravity, Urine: 1.01 (ref 1.005–1.030)

## 2016-02-19 LAB — CBC
HEMATOCRIT: 35 % — AB (ref 36.0–46.0)
Hemoglobin: 12 g/dL (ref 12.0–15.0)
MCH: 28.5 pg (ref 26.0–34.0)
MCHC: 34.3 g/dL (ref 30.0–36.0)
MCV: 83.1 fL (ref 78.0–100.0)
PLATELETS: 171 10*3/uL (ref 150–400)
RBC: 4.21 MIL/uL (ref 3.87–5.11)
RDW: 14.1 % (ref 11.5–15.5)
WBC: 16.3 10*3/uL — AB (ref 4.0–10.5)

## 2016-02-19 MED ORDER — OXYTOCIN 40 UNITS IN LACTATED RINGERS INFUSION - SIMPLE MED
1.0000 m[IU]/min | INTRAVENOUS | Status: DC
  Administered 2016-02-19: 2 m[IU]/min via INTRAVENOUS
  Filled 2016-02-19: qty 1000

## 2016-02-19 MED ORDER — LACTATED RINGERS IV SOLN
INTRAVENOUS | Status: DC
  Administered 2016-02-19: 22:00:00 via INTRAVENOUS

## 2016-02-19 MED ORDER — LACTATED RINGERS IV SOLN
500.0000 mL | Freq: Once | INTRAVENOUS | Status: AC
  Administered 2016-02-19: 500 mL via INTRAVENOUS

## 2016-02-19 MED ORDER — FENTANYL 2.5 MCG/ML BUPIVACAINE 1/10 % EPIDURAL INFUSION (WH - ANES)
14.0000 mL/h | INTRAMUSCULAR | Status: DC | PRN
  Administered 2016-02-19: 14 mL/h via EPIDURAL
  Filled 2016-02-19 (×2): qty 125

## 2016-02-19 MED ORDER — LACTATED RINGERS IV SOLN: 500.0000 mL | INTRAVENOUS | Status: DC | PRN

## 2016-02-19 MED ORDER — SOD CITRATE-CITRIC ACID 500-334 MG/5ML PO SOLN: 30.0000 mL | ORAL | Status: DC | PRN

## 2016-02-19 MED ORDER — ONDANSETRON HCL 4 MG/2ML IJ SOLN: 4.0000 mg | Freq: Four times a day (QID) | INTRAMUSCULAR | Status: DC | PRN

## 2016-02-19 MED ORDER — DIPHENHYDRAMINE HCL 50 MG/ML IJ SOLN: 12.5000 mg | INTRAMUSCULAR | Status: DC | PRN

## 2016-02-19 MED ORDER — ACETAMINOPHEN 325 MG PO TABS: 650.0000 mg | ORAL_TABLET | ORAL | Status: DC | PRN

## 2016-02-19 MED ORDER — TERBUTALINE SULFATE 1 MG/ML IJ SOLN
0.2500 mg | Freq: Once | INTRAMUSCULAR | Status: DC | PRN
  Filled 2016-02-19: qty 1

## 2016-02-19 MED ORDER — SODIUM CHLORIDE 0.9 % IV SOLN
2.0000 g | Freq: Four times a day (QID) | INTRAVENOUS | Status: DC
  Administered 2016-02-19 – 2016-02-20 (×2): 2 g via INTRAVENOUS
  Filled 2016-02-19 (×4): qty 2000

## 2016-02-19 MED ORDER — EPHEDRINE 5 MG/ML INJ
10.0000 mg | INTRAVENOUS | Status: DC | PRN
  Filled 2016-02-19: qty 4

## 2016-02-19 MED ORDER — PHENYLEPHRINE 40 MCG/ML (10ML) SYRINGE FOR IV PUSH (FOR BLOOD PRESSURE SUPPORT)
80.0000 ug | PREFILLED_SYRINGE | INTRAVENOUS | Status: DC | PRN
  Filled 2016-02-19: qty 5
  Filled 2016-02-19: qty 10

## 2016-02-19 MED ORDER — OXYTOCIN BOLUS FROM INFUSION
500.0000 mL | Freq: Once | INTRAVENOUS | Status: AC
  Administered 2016-02-20: 500 mL via INTRAVENOUS

## 2016-02-19 MED ORDER — PHENYLEPHRINE 40 MCG/ML (10ML) SYRINGE FOR IV PUSH (FOR BLOOD PRESSURE SUPPORT)
80.0000 ug | PREFILLED_SYRINGE | INTRAVENOUS | Status: DC | PRN
  Filled 2016-02-19: qty 5

## 2016-02-19 MED ORDER — OXYTOCIN 40 UNITS IN LACTATED RINGERS INFUSION - SIMPLE MED: 2.5000 [IU]/h | INTRAVENOUS | Status: DC

## 2016-02-19 MED ORDER — LIDOCAINE HCL (PF) 1 % IJ SOLN
INTRAMUSCULAR | Status: DC | PRN
  Administered 2016-02-19 (×2): 4 mL

## 2016-02-19 MED ORDER — LIDOCAINE HCL (PF) 1 % IJ SOLN
30.0000 mL | INTRAMUSCULAR | Status: DC | PRN
  Filled 2016-02-19: qty 30

## 2016-02-19 NOTE — Anesthesia Preprocedure Evaluation (Signed)
Anesthesia Evaluation  Patient identified by MRN, date of birth, ID band Patient awake    Reviewed: Allergy & Precautions, NPO status , Patient's Chart, lab work & pertinent test results  History of Anesthesia Complications Negative for: history of anesthetic complications  Airway Mallampati: II  TM Distance: >3 FB Neck ROM: Full    Dental no notable dental hx. (+) Dental Advisory Given   Pulmonary former smoker,    Pulmonary exam normal breath sounds clear to auscultation       Cardiovascular negative cardio ROS Normal cardiovascular exam Rhythm:Regular Rate:Normal     Neuro/Psych  Headaches, PSYCHIATRIC DISORDERS Anxiety    GI/Hepatic negative GI ROS, Neg liver ROS,   Endo/Other  negative endocrine ROS  Renal/GU negative Renal ROS  negative genitourinary   Musculoskeletal negative musculoskeletal ROS (+)   Abdominal   Peds negative pediatric ROS (+)  Hematology negative hematology ROS (+)   Anesthesia Other Findings   Reproductive/Obstetrics (+) Pregnancy                             Anesthesia Physical Anesthesia Plan  ASA: II  Anesthesia Plan: Epidural   Post-op Pain Management:    Induction:   Airway Management Planned:   Additional Equipment:   Intra-op Plan:   Post-operative Plan:   Informed Consent: I have reviewed the patients History and Physical, chart, labs and discussed the procedure including the risks, benefits and alternatives for the proposed anesthesia with the patient or authorized representative who has indicated his/her understanding and acceptance.   Dental advisory given  Plan Discussed with: CRNA  Anesthesia Plan Comments:         Anesthesia Quick Evaluation

## 2016-02-19 NOTE — Anesthesia Procedure Notes (Signed)
Epidural Patient location during procedure: OB  Staffing Anesthesiologist: Jennifer Payes Performed: anesthesiologist   Preanesthetic Checklist Completed: patient identified, site marked, surgical consent, pre-op evaluation, timeout performed, IV checked, risks and benefits discussed and monitors and equipment checked  Epidural Patient position: sitting Prep: site prepped and draped and DuraPrep Patient monitoring: continuous pulse ox and blood pressure Approach: midline Location: L3-L4 Injection technique: LOR saline  Needle:  Needle type: Tuohy  Needle gauge: 17 G Needle length: 9 cm and 9 Needle insertion depth: 5 cm cm Catheter type: closed end flexible Catheter size: 19 Gauge Catheter at skin depth: 10 cm Test dose: negative  Assessment Events: blood not aspirated, injection not painful, no injection resistance, negative IV test and no paresthesia  Additional Notes Patient identified. Risks/Benefits/Options discussed with patient including but not limited to bleeding, infection, nerve damage, paralysis, failed block, incomplete pain control, headache, blood pressure changes, nausea, vomiting, reactions to medication both or allergic, itching and postpartum back pain. Confirmed with bedside nurse the patient's most recent platelet count. Confirmed with patient that they are not currently taking any anticoagulation, have any bleeding history or any family history of bleeding disorders. Patient expressed understanding and wished to proceed. All questions were answered. Sterile technique was used throughout the entire procedure. Please see nursing notes for vital signs. Test dose was given through epidural catheter and negative prior to continuing to dose epidural or start infusion. Warning signs of high block given to the patient including shortness of breath, tingling/numbness in hands, complete motor block, or any concerning symptoms with instructions to call for help. Patient was  given instructions on fall risk and not to get out of bed. All questions and concerns addressed with instructions to call with any issues or inadequate analgesia.        

## 2016-02-19 NOTE — MAU Note (Signed)
Started losing mucous plug on Wed. jwas having cramping after that.  Today cramping has been very intense, 5-7 min.  Panties were kind of wet when she went to the bathroom earlier

## 2016-02-19 NOTE — H&P (Signed)
Ann Moore is a 25 y.o. female presenting for Preterm contractions with PPROM here . OB History    Gravida Para Term Preterm AB Living   3 1 1  0 1 1   SAB TAB Ectopic Multiple Live Births   1 0 0 0 1     Past Medical History:  Diagnosis Date  . Anxiety   . Headache   . Missed abortion 03/15/2014  . Pregnant 08/20/2015  . UTI (lower urinary tract infection) 08/20/2015  . Vaginal discharge 08/20/2015   Past Surgical History:  Procedure Laterality Date  . DILATION AND CURETTAGE OF UTERUS     Family History: family history includes Cancer in her maternal grandfather; Diabetes in her maternal grandfather and sister; Hypertension in her mother. Social History:  reports that she quit smoking about 5 months ago. Her smoking use included Cigarettes. She smoked 0.00 packs per day for 7.00 years. She has never used smokeless tobacco. She reports that she does not drink alcohol or use drugs.     Maternal Diabetes: No Genetic Screening: Normal Maternal Ultrasounds/Referrals: Normal Fetal Ultrasounds or other Referrals:  None Maternal Substance Abuse:  No Significant Maternal Medications:  None Significant Maternal Lab Results:  None  History of Trich twice this pregnancy, last TOC neg Other Comments:  PPROM with labor  Review of Systems  Constitutional: Negative for chills, fever and malaise/fatigue.  Gastrointestinal: Positive for abdominal pain. Negative for constipation, diarrhea, nausea and vomiting.  Genitourinary: Negative for dysuria.  Musculoskeletal: Negative for myalgias.  Neurological: Negative for dizziness.   Maternal Medical History:  Reason for admission: Rupture of membranes and contractions.  Nausea.  Contractions: Onset was less than 1 hour ago.   Frequency: irregular.   Perceived severity is moderate.    Fetal activity: Perceived fetal activity is normal.   Last perceived fetal movement was within the past hour.    Prenatal complications: Preterm labor.    No bleeding, PIH or pre-eclampsia.   Prenatal Complications - Diabetes: none.    Dilation: 5.5 Effacement (%): 80 Station: Ballotable Exam by:: A. Jones RNC Blood pressure 120/91, pulse (!) 123, temperature 98.4 F (36.9 C), temperature source Oral, resp. rate 18, last menstrual period 05/25/2015. Maternal Exam:  Uterine Assessment: Contraction strength is moderate.  Contraction frequency is regular.   Abdomen: Patient reports no abdominal tenderness. Fundal height is 36.   Estimated fetal weight is 6.5.   Fetal presentation: vertex  Introitus: Normal vulva. Normal vagina.  Ferning test: positive.  Nitrazine test: not done. Amniotic fluid character: clear.  Pelvis: adequate for delivery.   Cervix: Cervix evaluated by digital exam.   Cervix exam by RN  Fetal Exam Fetal Monitor Review: Mode: ultrasound.   Baseline rate: 145.  Variability: moderate (6-25 bpm).   Pattern: accelerations present.    Fetal State Assessment: Category I - tracings are normal.     Physical Exam  Constitutional: She is oriented to person, place, and time. She appears well-developed and well-nourished. No distress.  HENT:  Head: Normocephalic.  Cardiovascular: Normal rate, regular rhythm and normal heart sounds.  Exam reveals no gallop and no friction rub.   No murmur heard. Respiratory: Effort normal and breath sounds normal. No respiratory distress. She has no wheezes. She has no rales.  GI: Soft. She exhibits no distension. There is no tenderness. There is no rebound and no guarding.  Genitourinary:  Genitourinary Comments: Dilation: 5.5 Effacement (%): 80 Cervical Position: Middle Station: Ballotable Exam by:: Dione Plover RNC  Musculoskeletal: Normal range of motion.  Neurological: She is alert and oriented to person, place, and time.  Skin: Skin is warm and dry.  Psychiatric: She has a normal mood and affect.    Prenatal labs: ABO, Rh:   Antibody: Negative (07/25 0855) Rubella:    RPR: Non Reactive (07/25 0855)  HBsAg: Negative (04/18 1546)  HIV: Non Reactive (07/25 0855)  GBS:     Assessment/Plan: SIUP at 117w2d PPROM Preterm labor  [facility] Routine labor orders NICU contacted.  They state it is OK to admit, but if baby requires NICU, will need transfer Pt informed and is OK with that.   Pt has had Betamethasone a month ago   [facility] HospitalWILLIAMS,Ann Thune 02/19/2016, 6:11 PM

## 2016-02-20 ENCOUNTER — Encounter (HOSPITAL_COMMUNITY): Payer: Self-pay

## 2016-02-20 DIAGNOSIS — O42013 Preterm premature rupture of membranes, onset of labor within 24 hours of rupture, third trimester: Secondary | ICD-10-CM

## 2016-02-20 DIAGNOSIS — Z3A35 35 weeks gestation of pregnancy: Secondary | ICD-10-CM

## 2016-02-20 DIAGNOSIS — Z87891 Personal history of nicotine dependence: Secondary | ICD-10-CM

## 2016-02-20 LAB — RPR: RPR: NONREACTIVE

## 2016-02-20 LAB — ABO/RH: ABO/RH(D): O POS

## 2016-02-20 MED ORDER — PRENATAL MULTIVITAMIN CH
1.0000 | ORAL_TABLET | Freq: Every day | ORAL | Status: DC
  Administered 2016-02-20 – 2016-02-22 (×3): 1 via ORAL
  Filled 2016-02-20 (×3): qty 1

## 2016-02-20 MED ORDER — DIBUCAINE 1 % RE OINT: 1.0000 "application " | TOPICAL_OINTMENT | RECTAL | Status: DC | PRN

## 2016-02-20 MED ORDER — WITCH HAZEL-GLYCERIN EX PADS: 1.0000 "application " | MEDICATED_PAD | CUTANEOUS | Status: DC | PRN

## 2016-02-20 MED ORDER — COCONUT OIL OIL: 1.0000 "application " | TOPICAL_OIL | Status: DC | PRN

## 2016-02-20 MED ORDER — SODIUM CHLORIDE 0.9% FLUSH: 3.0000 mL | Freq: Two times a day (BID) | INTRAVENOUS | Status: DC

## 2016-02-20 MED ORDER — IBUPROFEN 600 MG PO TABS
600.0000 mg | ORAL_TABLET | Freq: Four times a day (QID) | ORAL | Status: DC
  Administered 2016-02-20 – 2016-02-22 (×10): 600 mg via ORAL
  Filled 2016-02-20 (×9): qty 1

## 2016-02-20 MED ORDER — OXYCODONE-ACETAMINOPHEN 5-325 MG PO TABS
1.0000 | ORAL_TABLET | Freq: Once | ORAL | Status: AC
  Administered 2016-02-20: 1 via ORAL
  Filled 2016-02-20: qty 1

## 2016-02-20 MED ORDER — ZOLPIDEM TARTRATE 5 MG PO TABS: 5.0000 mg | ORAL_TABLET | Freq: Every evening | ORAL | Status: DC | PRN

## 2016-02-20 MED ORDER — ONDANSETRON HCL 4 MG PO TABS: 4.0000 mg | ORAL_TABLET | ORAL | Status: DC | PRN

## 2016-02-20 MED ORDER — ONDANSETRON HCL 4 MG/2ML IJ SOLN: 4.0000 mg | INTRAMUSCULAR | Status: DC | PRN

## 2016-02-20 MED ORDER — DIPHENHYDRAMINE HCL 25 MG PO CAPS
25.0000 mg | ORAL_CAPSULE | Freq: Four times a day (QID) | ORAL | Status: DC | PRN
  Administered 2016-02-21: 25 mg via ORAL
  Filled 2016-02-20: qty 1

## 2016-02-20 MED ORDER — ACETAMINOPHEN 325 MG PO TABS
650.0000 mg | ORAL_TABLET | ORAL | Status: DC | PRN
  Administered 2016-02-20: 650 mg via ORAL
  Filled 2016-02-20: qty 2

## 2016-02-20 MED ORDER — TETANUS-DIPHTH-ACELL PERTUSSIS 5-2.5-18.5 LF-MCG/0.5 IM SUSP
0.5000 mL | Freq: Once | INTRAMUSCULAR | Status: AC
  Administered 2016-02-21: 0.5 mL via INTRAMUSCULAR
  Filled 2016-02-20: qty 0.5

## 2016-02-20 MED ORDER — SIMETHICONE 80 MG PO CHEW: 80.0000 mg | CHEWABLE_TABLET | ORAL | Status: DC | PRN

## 2016-02-20 MED ORDER — SODIUM CHLORIDE 0.9 % IV SOLN: 250.0000 mL | INTRAVENOUS | Status: DC | PRN

## 2016-02-20 MED ORDER — BENZOCAINE-MENTHOL 20-0.5 % EX AERO
1.0000 "application " | INHALATION_SPRAY | CUTANEOUS | Status: DC | PRN
  Filled 2016-02-20: qty 56

## 2016-02-20 MED ORDER — OXYTOCIN 40 UNITS IN LACTATED RINGERS INFUSION - SIMPLE MED: 2.5000 [IU]/h | INTRAVENOUS | Status: DC | PRN

## 2016-02-20 MED ORDER — SODIUM CHLORIDE 0.9% FLUSH: 3.0000 mL | INTRAVENOUS | Status: DC | PRN

## 2016-02-20 MED ORDER — SENNOSIDES-DOCUSATE SODIUM 8.6-50 MG PO TABS
2.0000 | ORAL_TABLET | ORAL | Status: DC
  Filled 2016-02-20: qty 2

## 2016-02-20 NOTE — Anesthesia Postprocedure Evaluation (Signed)
Anesthesia Post Note  Patient: Ann Moore  Procedure(s) Performed: * No procedures listed *  Patient location during evaluation: Mother Baby Anesthesia Type: Epidural Level of consciousness: awake and alert and oriented Pain management: satisfactory to patient Vital Signs Assessment: post-procedure vital signs reviewed and stable Respiratory status: spontaneous breathing and nonlabored ventilation Cardiovascular status: stable Postop Assessment: no headache, no backache, no signs of nausea or vomiting, adequate PO intake and patient able to bend at knees (patient up walking) Anesthetic complications: no     Last Vitals:  Vitals:   02/20/16 0400 02/20/16 0500  BP: 125/70 115/64  Pulse: 89 81  Resp: 18 18  Temp: 36.7 C 36.9 C    Last Pain:  Vitals:   02/20/16 0650  TempSrc:   PainSc: 2    Pain Goal:                 Madison HickmanGREGORY,Aundra Pung

## 2016-02-21 LAB — CBC
HEMATOCRIT: 30.7 % — AB (ref 36.0–46.0)
Hemoglobin: 10.2 g/dL — ABNORMAL LOW (ref 12.0–15.0)
MCH: 28.1 pg (ref 26.0–34.0)
MCHC: 33.2 g/dL (ref 30.0–36.0)
MCV: 84.6 fL (ref 78.0–100.0)
PLATELETS: 144 10*3/uL — AB (ref 150–400)
RBC: 3.63 MIL/uL — ABNORMAL LOW (ref 3.87–5.11)
RDW: 14.2 % (ref 11.5–15.5)
WBC: 13.2 10*3/uL — AB (ref 4.0–10.5)

## 2016-02-21 LAB — RUBELLA SCREEN

## 2016-02-21 NOTE — Clinical Social Work Maternal (Signed)
  CLINICAL SOCIAL WORK MATERNAL/CHILD NOTE  Patient Details  Name: Ann Moore MRN: [id number] Date of Birth: Apr 07, 1991  Date:  02/21/2016  Clinical Social Worker Initiating Note:  Laurey Arrow Date/ Time Initiated:  02/21/16/1420     Child's Name:  Ann Moore   Legal Guardian:  Mother   Need for Interpreter:  None   Date of Referral:  02/21/16     Reason for Referral:  Current Substance Use/Substance Use During Pregnancy    Referral Source:  Central Nursery   Address:  [address] 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  MOB was polite, inviting, and interested in meeting with CSW.  MOB gave CSW permission to meet with MOB while FOB Silvano Bilis) was present.  Although FOB was in the rooms, FOB did not engage with CSW during the session.  During the session, MOB was attentive to infant and  attachment and bonding was evident. CSW inquired about MOB's substance use and MOB reported the use of marijuana was prior to MOB's pregnancy confirmation.  MOB reported that MOB no longer use any substance once MOB's pregnancy was confirmed. MOB reviewed the hospital's drug screen policy, and informed MOB of the 2  screenings for the infant.  CSW reported to MOB that the infant's UDS was negative, and CSW will follow the infant's cord screen.  MOB was made aware, that if infant's cord screen is positive, CSW will make a report to [facility] CPS. CSW offered MOB SA resources, and MOB declined. MOB was understanding of hospital policy and did not have any questions for CSW.  CSW thanked MOB for meeting with CSW and provided MOB with CSW contact information. CSW Plan/Description:  Patient/Family Education , No Further Intervention Required/No Barriers to Discharge (CSW will follow cord and make a report to [place] if warranted. )   Laurey Arrow, MSW, LCSW Clinical Social Work [phone]    Dimple Nanas, LCSW 02/21/2016, 2:24 PM

## 2016-02-21 NOTE — Progress Notes (Signed)
Post Partum Day 1 Subjective: no complaints, up ad lib, voiding, tolerating PO and + flatus  Objective: Blood pressure 107/69, pulse 80, temperature 97.8 F (36.6 C), temperature source Oral, resp. rate 18, height 5\' 5"  (1.651 m), weight 81.2 kg (179 lb), last menstrual period 05/25/2015, SpO2 97 %, unknown if currently breastfeeding.  Physical Exam:  General: alert, cooperative and no distress Lochia: appropriate Uterine Fundus: firm DVT Evaluation: No evidence of DVT seen on physical exam. Negative Homan's sign.   Recent Labs  02/19/16 1812 02/21/16 0523  HGB 12.0 10.2*  HCT 35.0* 30.7*    Assessment/Plan: Plan for discharge tomorrow   LOS: 2 days   Leland Herlsia J Yoo 02/21/2016, 7:36 AM

## 2016-02-22 DIAGNOSIS — IMO0001 Reserved for inherently not codable concepts without codable children: Secondary | ICD-10-CM

## 2016-02-22 LAB — CULTURE, BETA STREP (GROUP B ONLY)

## 2016-02-22 MED ORDER — MEASLES, MUMPS & RUBELLA VAC ~~LOC~~ INJ
0.5000 mL | INJECTION | Freq: Once | SUBCUTANEOUS | Status: AC
  Administered 2016-02-22: 0.5 mL via SUBCUTANEOUS
  Filled 2016-02-22: qty 0.5

## 2016-02-22 MED ORDER — IBUPROFEN 600 MG PO TABS: 600.0000 mg | ORAL_TABLET | Freq: Four times a day (QID) | ORAL | 0 refills | Status: DC

## 2016-02-22 NOTE — Discharge Instructions (Signed)

## 2016-02-22 NOTE — Discharge Summary (Signed)
OB Discharge Summary     Patient Name: Ann Moore DOB: 10/04/1990 MRN: [id number]  Date of admission: 02/19/2016 Delivering MD: Jen MowMUMAW, [facility]   Date of discharge: 02/22/2016  Admitting diagnosis: 35 wks back pain not sure if ctx  Intrauterine pregnancy: 4287w3d     Secondary diagnosis:  Active Problems:   Preterm premature rupture of membranes (PPROM) with onset of labor within 24 hours of rupture in first trimester, antepartum   Status post vaginal delivery  Additional problems: none     Discharge diagnosis: Preterm Pregnancy Delivered                                                                                                Post partum procedures:none  Augmentation: none  Complications: None  Hospital course:  Onset of Labor With Vaginal Delivery     25 y.o. yo [id number] at 8187w3d was admitted with PPROM followed by onset of preterm labor on 02/19/2016. Patient had an uncomplicated labor course as follows:  Membrane Rupture Time/Date: 5:57 PM ,02/19/2016   Intrapartum Procedures: Episiotomy: None [1]                                         Lacerations:     Patient had a delivery of a Viable infant. 02/20/2016  Information for the patient's newborn:  Marcelina MorelMurray, Boy Janiylah [[id number]][[id number]]  Delivery Method: Vaginal, Spontaneous Delivery (Filed from Delivery Summary)    Pateint had an uncomplicated postpartum course.  She is ambulating, tolerating a regular diet, passing flatus, and urinating well. Patient is discharged home in stable condition on 02/22/16.   Physical exam Vitals:   02/20/16 1800 02/21/16 0530 02/21/16 1820 02/22/16 0600  BP: 108/62 107/69 117/67 120/79  Pulse: 95 80 86 76  Resp: 20 18 18 18   Temp: 98.2 F (36.8 C) 97.8 F (36.6 C) 98 F (36.7 C) 98.1 F (36.7 C)  TempSrc: Oral Oral Oral Oral  SpO2: 97%     Weight:      Height:       General: alert, cooperative and no distress Lochia: appropriate Uterine Fundus: firm Incision:  N/A DVT Evaluation: No evidence of DVT seen on physical exam. No cords or calf tenderness. No significant calf/ankle edema. Labs: Lab Results  Component Value Date   WBC 13.2 (H) 02/21/2016   HGB 10.2 (L) 02/21/2016   HCT 30.7 (L) 02/21/2016   MCV 84.6 02/21/2016   PLT 144 (L) 02/21/2016   CMP Latest Ref Rng & Units 06/13/2015  Glucose 65 - 99 mg/dL 130(Q138(H)  BUN 6 - 20 mg/dL 12  Creatinine 6.570.44 - 8.461.00 mg/dL 9.620.83  Sodium 952135 - 841145 mmol/L 140  Potassium 3.5 - 5.1 mmol/L 4.1  Chloride 101 - 111 mmol/L 106  CO2 22 - 32 mmol/L 26  Calcium 8.9 - 10.3 mg/dL 9.9  Total Protein 6.5 - 8.1 g/dL 8.1  Total Bilirubin 0.3 - 1.2 mg/dL 1.2  Alkaline Phos 38 - 126 U/L 73  AST 15 -  41 U/L 21  ALT 14 - 54 U/L 19    Discharge instruction: per After Visit Summary and "Baby and Me Booklet".  After visit meds:    Medication List    TAKE these medications   acetaminophen 325 MG tablet Commonly known as:  TYLENOL Take 650 mg by mouth every 6 (six) hours as needed for mild pain, moderate pain or headache.   ibuprofen 600 MG tablet Commonly known as:  ADVIL,MOTRIN Take 1 tablet (600 mg total) by mouth every 6 (six) hours.   prenatal vitamin w/FE, FA 27-1 MG Tabs tablet Take 1 tablet by mouth daily.       Diet: routine diet  Activity: Advance as tolerated. Pelvic rest for 6 weeks.   Outpatient follow up:6 weeks Follow up Appt: Future Appointments Date Time Provider Department Center  02/29/2016 3:00 PM [facility] ULTRAS[facility] None  02/29/2016 3:30 PM Lazaro Arms, MD [facility]   Follow up Visit:No Follow-up on file.  Postpartum contraception: Nexplanon  Newborn Data: Live born female  Birth Weight: 6 lb 5.6 oz (2880 g) APGAR: 8, 9  Baby Feeding: Bottle Disposition:home with mother   02/22/2016 Frederik Pear, MD  OB FELLOW DISCHARGE ATTESTATION  I have seen and examined this patient and agree with above documentation in the resident's note.   Ernestina Penna, MD 7:51 PM

## 2016-02-29 ENCOUNTER — Other Ambulatory Visit: Payer: Medicaid Other

## 2016-02-29 ENCOUNTER — Encounter: Payer: Medicaid Other | Admitting: Obstetrics & Gynecology

## 2016-03-02 ENCOUNTER — Telehealth: Payer: Self-pay | Admitting: Obstetrics & Gynecology

## 2016-03-02 NOTE — Telephone Encounter (Signed)
Pt states SVD 02/20/2016, needs a work note faxed to [phone] that she can return to work on 04/02/2016. Note completed and faxed per pt request.

## 2016-03-28 ENCOUNTER — Ambulatory Visit: Payer: Medicaid Other | Admitting: Women's Health

## 2016-04-04 ENCOUNTER — Encounter: Payer: Self-pay | Admitting: Advanced Practice Midwife

## 2016-04-04 ENCOUNTER — Ambulatory Visit (INDEPENDENT_AMBULATORY_CARE_PROVIDER_SITE_OTHER): Payer: Medicaid Other | Admitting: Advanced Practice Midwife

## 2016-04-04 DIAGNOSIS — N898 Other specified noninflammatory disorders of vagina: Secondary | ICD-10-CM | POA: Diagnosis not present

## 2016-04-04 DIAGNOSIS — A5901 Trichomonal vulvovaginitis: Secondary | ICD-10-CM

## 2016-04-04 MED ORDER — OMEPRAZOLE 20 MG PO CPDR: 20.0000 mg | DELAYED_RELEASE_CAPSULE | Freq: Every day | ORAL | 1 refills | Status: DC

## 2016-04-04 MED ORDER — METRONIDAZOLE 500 MG PO TABS: 500.0000 mg | ORAL_TABLET | Freq: Two times a day (BID) | ORAL | 0 refills | Status: DC

## 2016-04-04 NOTE — Progress Notes (Signed)
Ann Moore is a 25 y.o. who presents for a postpartum visit. She is 6 weeks postpartum following a spontaneous vaginal delivery. I have fully reviewed the prenatal and intrapartum course. The delivery was at 35.2 gestational weeks. She had spontaneous PTL/ROM. Anesthesia: epidural. Postpartum course has been uneventful. Baby's course has been uneventful. Baby is feeding by bottle. Bleeding: staining only. Bowel function is normal. Bladder function is normal. Patient is not sexually active. Contraception method is none. Postpartum depression screening: negative.   Current Outpatient Prescriptions:  .  acetaminophen (TYLENOL) 325 MG tablet, Take 650 mg by mouth every 6 (six) hours as needed for mild pain, moderate pain or headache. , Disp: , Rfl:  .  ibuprofen (ADVIL,MOTRIN) 600 MG tablet, Take 1 tablet (600 mg total) by mouth every 6 (six) hours., Disp: 30 tablet, Rfl: 0 .  metroNIDAZOLE (FLAGYL) 500 MG tablet, Take 1 tablet (500 mg total) by mouth 2 (two) times daily., Disp: 14 tablet, Rfl: 0 .  prenatal vitamin w/FE, FA (PRENATAL 1 + 1) 27-1 MG TABS tablet, Take 1 tablet by mouth daily., Disp: , Rfl:   Review of Systems   Constitutional: Negative for fever and chills Eyes: Negative for visual disturbances Respiratory: Negative for shortness of breath, dyspnea.  Has persistant and (mainly) nighttime cough, some heartburn Cardiovascular: Negative for chest pain or palpitations  Gastrointestinal: Negative for vomiting, diarrhea and constipation Genitourinary: Negative for dysuria and urgency.  + external itch Musculoskeletal: Negative for back pain, joint pain, myalgias  Neurological: Negative for dizziness and headaches    Objective:     Vitals:   04/04/16 0844  BP: 120/80  Pulse: 70   General:  alert, cooperative and no distress   Breasts:  negative  Lungs: clear to auscultation bilaterally  Heart:  regular rate and rhythm  Abdomen: Soft, nontender   Vulva:  several nearly  healed blistered areas (culture taken); thin frothy discharge; wet prep + trich  Vagina: See above  Cervix:  closed  Corpus: Well involuted     Rectal Exam: no hemorrhoids        Assessment:    normal postpartum exam. Trichomonas ? HSV Cough d/t reflux  Plan:   1. Contraception: Nexplanon    2.Rx flagyl 500mg  BID; partner to get retreated. No sex until Nexplanon; will recheck for trich then.  2. Follow up in: 3 weeks or as needed.  4.  HSV culture (although seems like lesion is nearly healed) 5.  Rx prilosec 20mg  BID

## 2016-04-07 LAB — HERPES SIMPLEX VIRUS CULTURE

## 2016-04-26 ENCOUNTER — Encounter: Payer: Medicaid Other | Admitting: Advanced Practice Midwife

## 2016-05-03 ENCOUNTER — Encounter: Payer: Self-pay | Admitting: Advanced Practice Midwife

## 2016-05-03 ENCOUNTER — Ambulatory Visit (INDEPENDENT_AMBULATORY_CARE_PROVIDER_SITE_OTHER): Payer: Medicaid Other | Admitting: Advanced Practice Midwife

## 2016-05-03 VITALS — BP 120/80 | HR 76 | Wt 162.0 lb

## 2016-05-03 DIAGNOSIS — R300 Dysuria: Secondary | ICD-10-CM

## 2016-05-03 DIAGNOSIS — Z029 Encounter for administrative examinations, unspecified: Secondary | ICD-10-CM

## 2016-05-03 DIAGNOSIS — Z3042 Encounter for surveillance of injectable contraceptive: Secondary | ICD-10-CM

## 2016-05-03 DIAGNOSIS — A599 Trichomoniasis, unspecified: Secondary | ICD-10-CM

## 2016-05-03 DIAGNOSIS — Z30017 Encounter for initial prescription of implantable subdermal contraceptive: Secondary | ICD-10-CM | POA: Insufficient documentation

## 2016-05-03 DIAGNOSIS — R309 Painful micturition, unspecified: Secondary | ICD-10-CM

## 2016-05-03 HISTORY — DX: Encounter for initial prescription of implantable subdermal contraceptive: Z30.017

## 2016-05-03 LAB — POCT URINALYSIS DIPSTICK
GLUCOSE UA: NEGATIVE
Ketones, UA: NEGATIVE
Protein, UA: 1
RBC UA: NEGATIVE

## 2016-05-03 MED ORDER — SULFAMETHOXAZOLE-TRIMETHOPRIM 800-160 MG PO TABS: 1.0000 | ORAL_TABLET | Freq: Two times a day (BID) | ORAL | 0 refills | Status: DC

## 2016-05-03 NOTE — Progress Notes (Signed)
  HPI:  Ann Moore is a 25 y.o. year old  female here for Nexplanon insertion.  Her LMP was 11/27 she is postpartum ,hasn't had sex since her LMP and her pregnancy test today was negative.  Risks/benefits/side effects of Nexplanon have been discussed and her questions have been answered.  Specifically, a failure rate of 05/998 has been reported, with an increased failure rate if pt takes St. John's Wort and/or antiseizure medicaitons.  Ann Piedraourtney D Abrigo is aware of the common side effect of irregular bleeding, which the incidence of decreases over time.   Past Medical History: Past Medical History:  Diagnosis Date  . Anxiety   . Headache   . Missed abortion 03/15/2014  . Pregnant 08/20/2015  . UTI (lower urinary tract infection) 08/20/2015  . Vaginal discharge 08/20/2015    Past Surgical History: Past Surgical History:  Procedure Laterality Date  . DILATION AND CURETTAGE OF UTERUS      Family History: Family History  Problem Relation Age of Onset  . Hypertension Mother   . Diabetes Sister   . Diabetes Maternal Grandfather   . Cancer Maternal Grandfather     liver, lung    Social History: Social History  Substance Use Topics  . Smoking status: Former Smoker    Packs/day: 0.00    Years: 7.00    Types: Cigarettes    Quit date: 08/28/2015  . Smokeless tobacco: Never Used     Comment: quit with preg confirmation  . Alcohol use No    Allergies:  Allergies  Allergen Reactions  . Hydrocodone Nausea And Vomiting and Rash      Her left arm, approximatly 4 inches proximal from the elbow, was cleansed with alcohol and anesthetized with 2cc of 2% Lidocaine.  The area was cleansed again and the Nexplanon was inserted without difficulty.  A pressure bandage was applied.  Pt was instructed to remove pressure bandage in a few hours, and keep insertion site covered with a bandaid for 3 days.  Back up contraception was recommended for 2 weeks.  Follow-up scheduled PRN  problems  C/O some mild dysuria since yesterday. + Nitrites TOC for + trich and culture sent Rx septra DS  CRESENZO-DISHMAN,Myron Lona 05/03/2016 12:12 PM

## 2016-05-03 NOTE — Assessment & Plan Note (Signed)
05/03/16  

## 2016-05-05 LAB — URINE CULTURE

## 2016-05-06 LAB — TRICHOMONAS VAGINALIS, PROBE AMP: TRICH VAG BY NAA: NEGATIVE

## 2016-06-22 IMAGING — US US MFM OB LIMITED
1 series · 15 of 28 positions shown · non-contrast
Comparison: none

[Series 1: us mfm ob limited · 28 acquisitions, 15 frames shown]
[im 1/28]
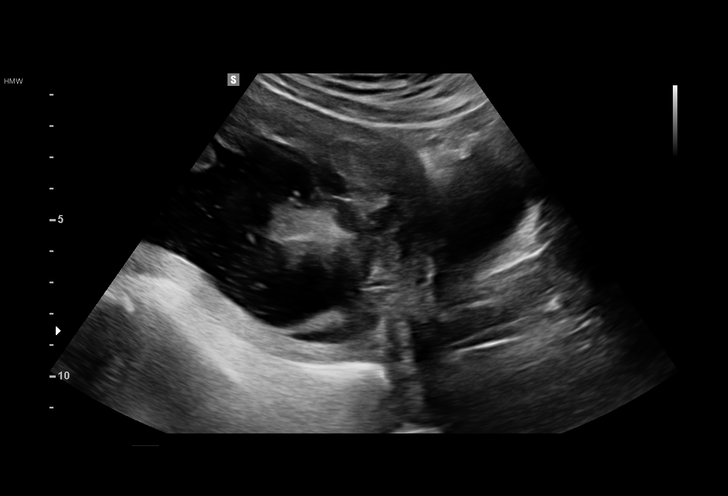
[im 3/28]
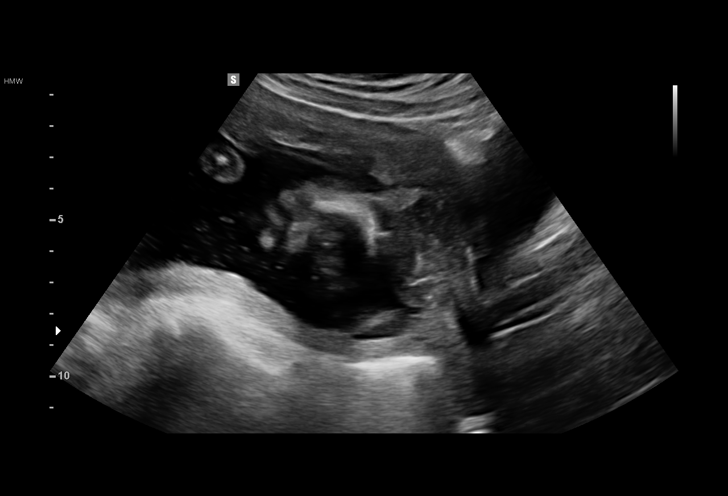
[im 5/28]
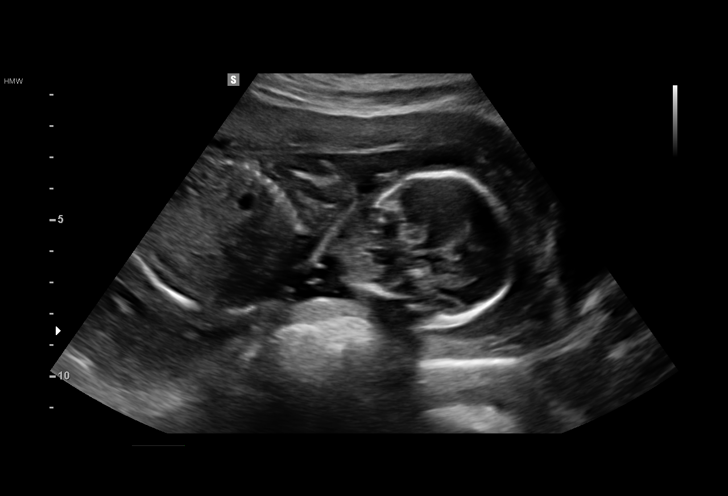
[im 7/28]
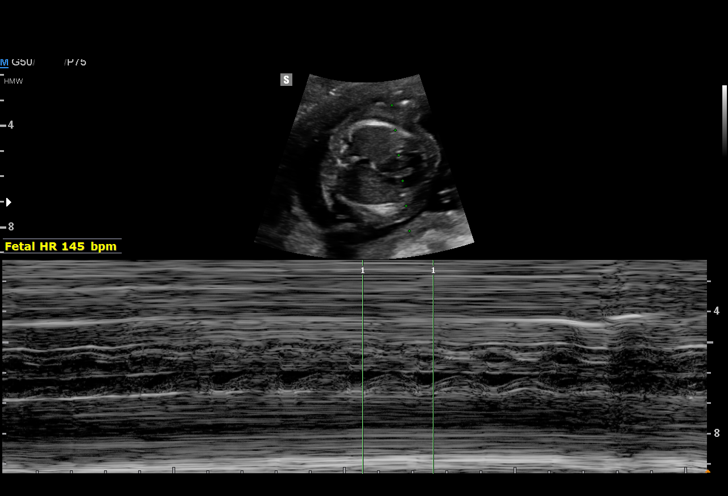
[im 9/28]
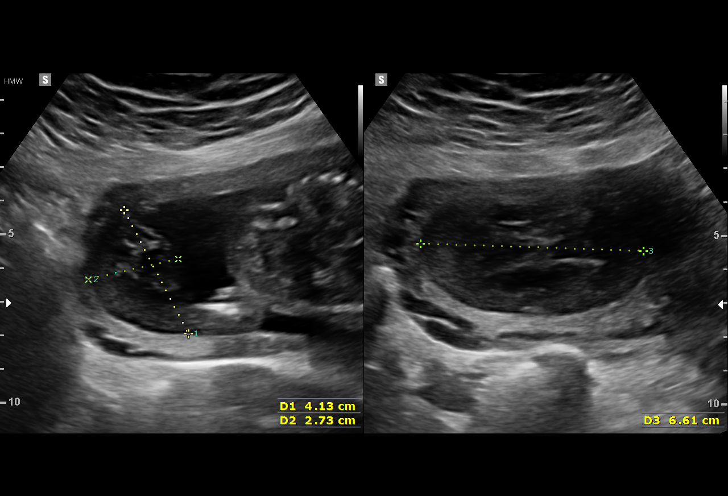
[im 11/28]
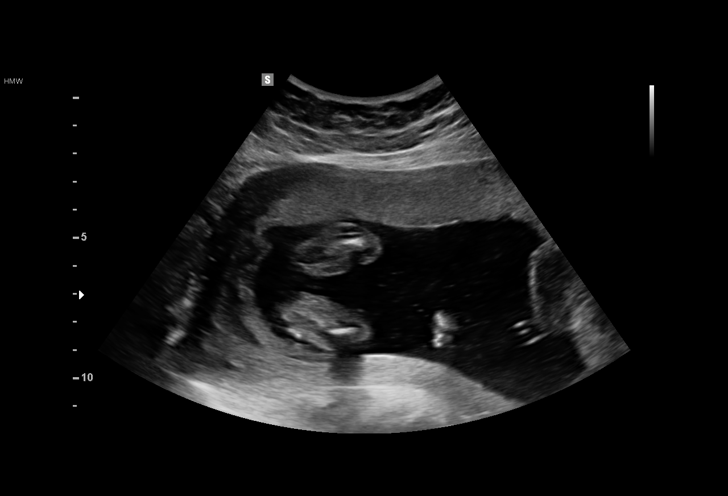
[im 13/28]
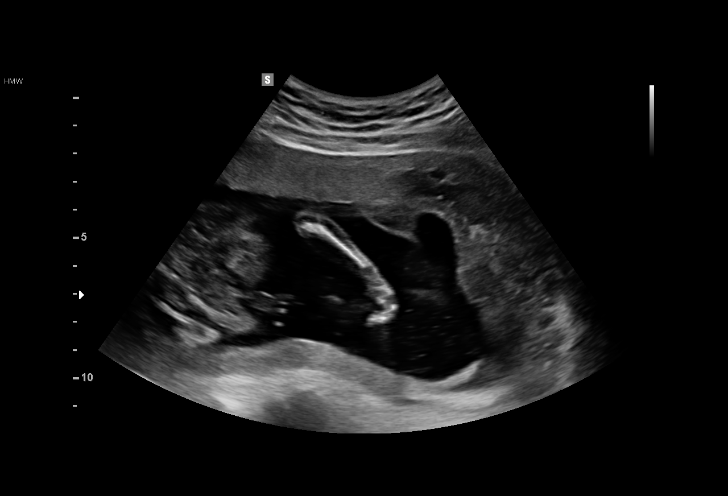
[im 15/28]
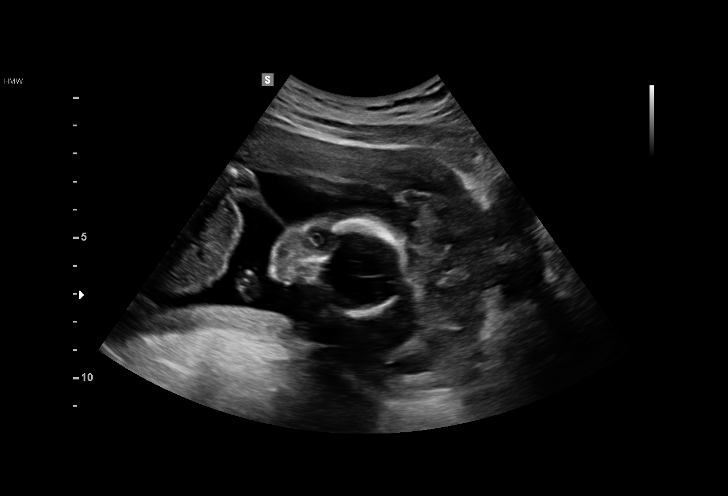
[im 16/28]
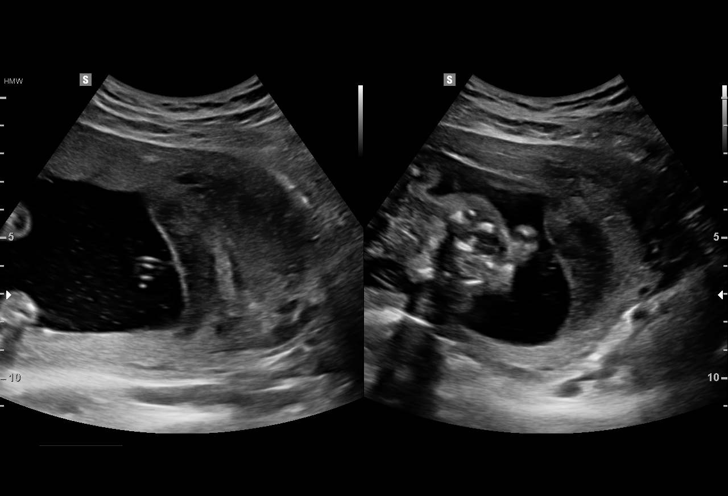
[im 18/28]
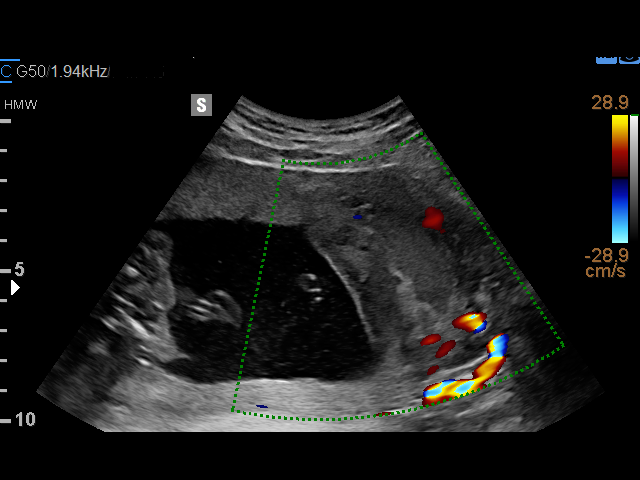
[im 20/28]
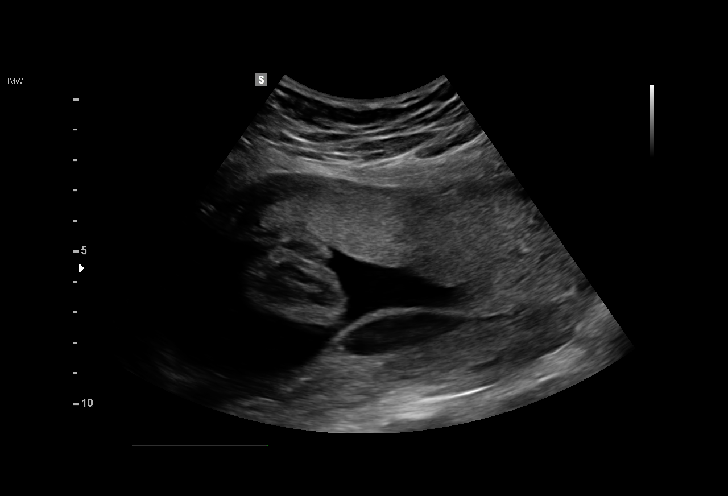
[im 22/28]
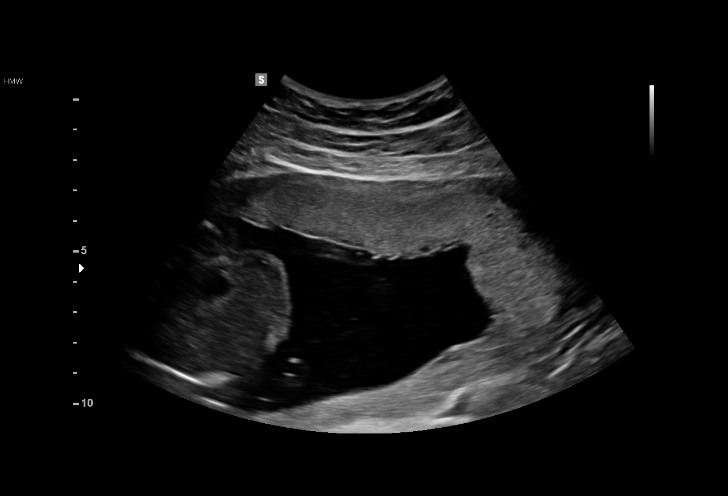
[im 24/28]
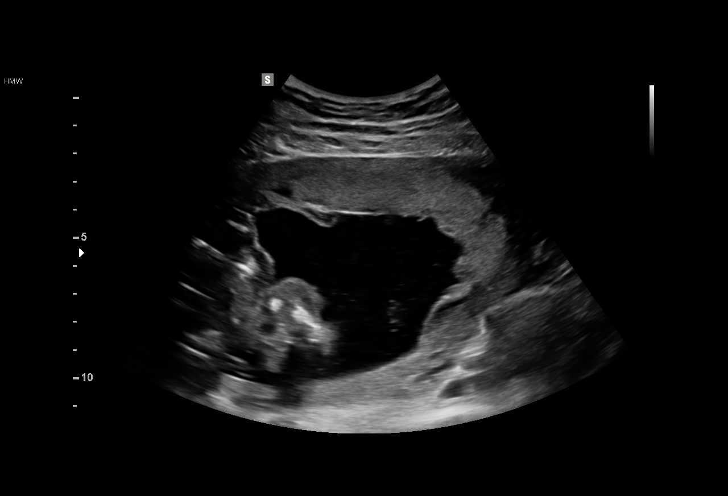
[im 26/28]
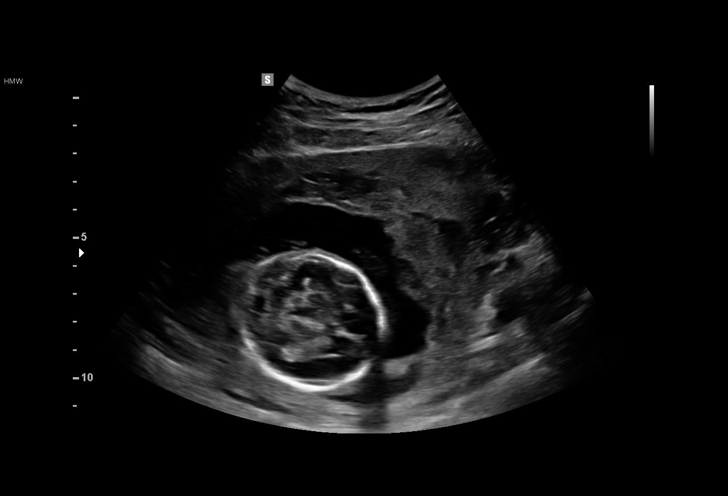
[im 28/28]
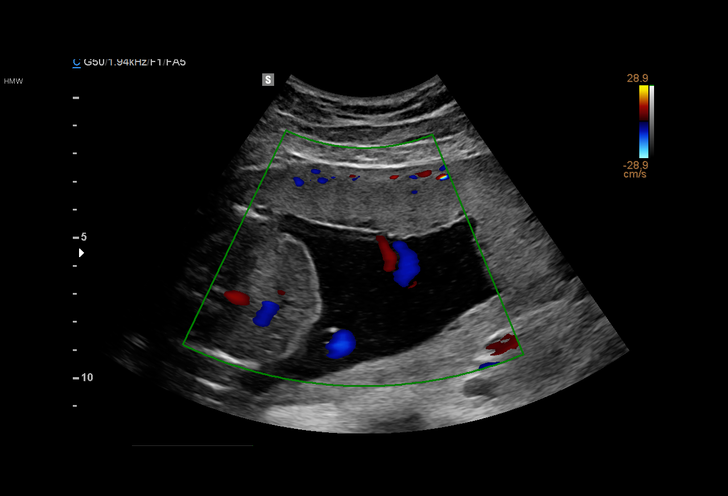

[15 of 28 positions shown; findings below may reference images not displayed]

Performed By:     Hurschler Burch          Secondary Phy.:   [facility]
[HOSPITAL][HOSPITAL]
MAU/Triage

Indications

19 weeks gestation of pregnancy
Vaginal bleeding in pregnancy, second
trimester
Subchorionic hemorrhage, antepartum (2
known)
Marijuana Abuse
OB History

Gravidity:    3         Term:   1         SAB:   1
Living:       1
Fetal Evaluation

Num Of Fetuses:     1
Fetal Heart         145
Rate(bpm):
Cardiac Activity:   Observed
Presentation:       Cephalic
Placenta:           Anterior
P. Cord Insertion:  Visualized, central

Amniotic Fluid
AFI FV:      Subjectively within normal limits

Largest Pocket(cm)
4.8

Comment:    Two subchorionic hemorrhages noted. 1. Fundal 6.3x9.7x8.8cm
2. Anterior 7.0x0.4x8.0cm
Gestational Age

LMP:           22w 2d        Date:  05/25/15                 EDD:   02/29/16
Best:          19w 0d     Det. By:  Early Ultrasound         EDD:   03/23/16
(08/26/15)
Cervix Uterus Adnexa

Cervix
Length:            2.9  cm.
Normal appearance by transabdominal scan.

Uterus
No abnormality visualized.

Left Ovary
No adnexal mass visualized.

Right Ovary
No adnexal mass visualized.

Cul De Sac:   No free fluid seen.

Adnexa:       No abnormality visualized.
Impression

Singleton intrauterine pregnancy at 19 weeks 0 days
gestation with fetal cardiac activity
Cephalic presentation
Anterior placenta without evidence of previa
Fetal anatomic survey not performed in this limited
examination
Two subchorionic hemorrhages noted. 1. Fundal
6.3x9.7x8.8cm 2. Anterior 7.0x0.4x8.0cm
Cervical length 2.9 cm
No adnexal masses noted
Recommendations

Consider serial ultrasounds for evaluation of fetal growth
Fetal anatomic survey at 
18-20 weeks gestation

## 2016-08-29 ENCOUNTER — Ambulatory Visit (INDEPENDENT_AMBULATORY_CARE_PROVIDER_SITE_OTHER): Payer: Medicaid Other | Admitting: Women's Health

## 2016-08-29 ENCOUNTER — Encounter (INDEPENDENT_AMBULATORY_CARE_PROVIDER_SITE_OTHER): Payer: Self-pay

## 2016-08-29 ENCOUNTER — Encounter: Payer: Self-pay | Admitting: Women's Health

## 2016-08-29 DIAGNOSIS — N926 Irregular menstruation, unspecified: Secondary | ICD-10-CM

## 2016-08-29 NOTE — Progress Notes (Signed)
   [facility] Visit  Patient name: Ann Moore MRN [id number]  Date of birth: [birth year]  CC & HPI:  Ann Moore is a 26 y.o. [phone] Caucasian female presenting today for report of irregular bleeding since nexplanon placed in Dec. Bled x 13days not long after placed. Then the entire month of March and is just now stopping.  No LMP recorded. The current method of family planning is nexplanon. Last pap 02/2104, neg  Pertinent History Reviewed:  Medical & Surgical Hx:   Past medical, surgical, family, and social history reviewed in electronic medical record Medications: Reviewed & Updated - see associated section Allergies: Reviewed in electronic medical record  Objective Findings:  Vitals: There were no vitals taken for this visit. There is no height or weight on file to calculate BMI.  Physical Examination: General appearance - alert, well appearing, and in no distress  No results found for this or any previous visit (from the past 24 hour(s)).   Assessment & Plan:  A:   Irregular bleeding w/ nexplanon  P:  Discussed bleeding patterns w/ nexplanon, still w/in 3-5mth period since having placed  If bleeding returns and is not stopping again, can call us for rx for megace  GC/CT today  Return for Oct for , Pap & physical.  Marge Duncans CNM, [facility] 08/29/2016 2:22 PM

## 2016-08-31 LAB — GC/CHLAMYDIA PROBE AMP
Chlamydia trachomatis, NAA: NEGATIVE
NEISSERIA GONORRHOEAE BY PCR: NEGATIVE

## 2017-01-20 ENCOUNTER — Other Ambulatory Visit: Payer: Self-pay | Admitting: Advanced Practice Midwife

## 2017-06-22 ENCOUNTER — Ambulatory Visit: Payer: Self-pay | Admitting: Family Medicine

## 2017-07-13 ENCOUNTER — Ambulatory Visit: Payer: Self-pay | Admitting: Family Medicine

## 2017-12-26 DIAGNOSIS — G44209 Tension-type headache, unspecified, not intractable: Secondary | ICD-10-CM | POA: Diagnosis not present

## 2017-12-26 DIAGNOSIS — H40033 Anatomical narrow angle, bilateral: Secondary | ICD-10-CM | POA: Diagnosis not present

## 2017-12-28 DIAGNOSIS — H5213 Myopia, bilateral: Secondary | ICD-10-CM | POA: Diagnosis not present

## 2018-02-22 ENCOUNTER — Other Ambulatory Visit: Payer: Self-pay

## 2018-02-22 ENCOUNTER — Emergency Department (HOSPITAL_COMMUNITY)
Admission: EM | Admit: 2018-02-22 | Discharge: 2018-02-22 | Disposition: A | Discharge status: Home / Self Care | Payer: Medicaid Other | Attending: Emergency Medicine | Admitting: Emergency Medicine

## 2018-02-22 ENCOUNTER — Encounter (HOSPITAL_COMMUNITY): Payer: Self-pay | Admitting: Emergency Medicine

## 2018-02-22 DIAGNOSIS — R1110 Vomiting, unspecified: Secondary | ICD-10-CM | POA: Diagnosis not present

## 2018-02-22 DIAGNOSIS — Z5321 Procedure and treatment not carried out due to patient leaving prior to being seen by health care provider: Secondary | ICD-10-CM | POA: Diagnosis not present

## 2018-02-22 LAB — COMPREHENSIVE METABOLIC PANEL
ALBUMIN: 4.9 g/dL (ref 3.5–5.0)
ALT: 10 U/L (ref 0–44)
ANION GAP: 7 (ref 5–15)
AST: 14 U/L — AB (ref 15–41)
Alkaline Phosphatase: 75 U/L (ref 38–126)
CHLORIDE: 106 mmol/L (ref 98–111)
CO2: 25 mmol/L (ref 22–32)
Calcium: 9.7 mg/dL (ref 8.9–10.3)
Creatinine, Ser: 0.7 mg/dL (ref 0.44–1.00)
GFR calc Af Amer: >60 mL/min (ref >60)
GFR calc non Af Amer: >60 mL/min (ref >60)
GLUCOSE: 92 mg/dL (ref 70–99)
POTASSIUM: 3.9 mmol/L (ref 3.5–5.1)
SODIUM: 138 mmol/L (ref 135–145)
TOTAL PROTEIN: 7.9 g/dL (ref 6.5–8.1)
Total Bilirubin: 0.7 mg/dL (ref 0.3–1.2)

## 2018-02-22 LAB — CBC
HEMATOCRIT: 46.4 % — AB (ref 36.0–46.0)
HEMOGLOBIN: 15.9 g/dL — AB (ref 12.0–15.0)
MCH: 30.3 pg (ref 26.0–34.0)
MCHC: 34.3 g/dL (ref 30.0–36.0)
MCV: 88.5 fL (ref 78.0–100.0)
Platelets: 208 10*3/uL (ref 150–400)
RBC: 5.24 MIL/uL — ABNORMAL HIGH (ref 3.87–5.11)
RDW: 13.5 % (ref 11.5–15.5)
WBC: 9.2 10*3/uL (ref 4.0–10.5)

## 2018-02-22 LAB — LIPASE, BLOOD: LIPASE: 27 U/L (ref 11–51)

## 2018-02-22 NOTE — ED Triage Notes (Signed)
Patient not in waiting area.

## 2018-02-22 NOTE — ED Triage Notes (Signed)
abd pain in center of abd on and off x 1 week.  Reports vomiting today and last bm last night

## 2018-02-22 NOTE — ED Triage Notes (Signed)
Patient rounds in waiting are. Patient not seen at present.

## 2018-02-22 NOTE — ED Triage Notes (Signed)
Patient called for Room, no answer. 

## 2019-05-08 ENCOUNTER — Telehealth: Payer: Self-pay | Admitting: Women's Health

## 2019-05-08 NOTE — Telephone Encounter (Signed)

## 2019-05-09 ENCOUNTER — Ambulatory Visit: Payer: Medicaid Other | Admitting: Women's Health

## 2019-05-09 ENCOUNTER — Other Ambulatory Visit: Payer: Self-pay

## 2019-05-09 ENCOUNTER — Encounter: Payer: Self-pay | Admitting: Women's Health

## 2019-05-09 VITALS — BP 132/78 | HR 90 | Ht 64.0 in | Wt 144.0 lb

## 2019-05-09 DIAGNOSIS — Z30011 Encounter for initial prescription of contraceptive pills: Secondary | ICD-10-CM

## 2019-05-09 DIAGNOSIS — Z3046 Encounter for surveillance of implantable subdermal contraceptive: Secondary | ICD-10-CM

## 2019-05-09 DIAGNOSIS — Z30017 Encounter for initial prescription of implantable subdermal contraceptive: Secondary | ICD-10-CM

## 2019-05-09 MED ORDER — LO LOESTRIN FE 1 MG-10 MCG / 10 MCG PO TABS: 1.0000 | ORAL_TABLET | Freq: Every day | ORAL | 3 refills | Status: DC

## 2019-05-09 NOTE — Progress Notes (Signed)
   [place] REMOVAL Patient name: Ann Moore MRN [id number]  Date of birth: February 13, 1991 Subjective Findings:   Ann Moore is a 28 y.o. [phone] Caucasian female being seen today for removal of a Nexplanon. Her Nexplanon was placed 05/03/16.  She desires removal because it's time. Signed copy of informed consent in chart.   No LMP recorded (exact date). Patient has had an implant. Last pap 2015. Results were:  normal The planned method of family planning is OCP (estrogen/progesterone). Does not smoke, no h/o HTN, DVT/PE, CVA, MI, or migraines w/ aura.  Pertinent History Reviewed:   Reviewed past medical,surgical, social, obstetrical and family history.  Reviewed problem list, medications and allergies. Objective Findings & Procedure:    Vitals:   05/09/19 1148  BP: 132/78  Pulse: 90  Weight: 144 lb (65.3 kg)  Height: 5\' 4"  (1.626 m)  Body mass index is 24.72 kg/m.  No results found for this or any previous visit (from the past 24 hour(s)).   Procedure by Maryagnes Amos, SNM Time out was performed.  Nexplanon site identified.  Area prepped in usual sterile fashon. One cc of 2% lidocaine was used to anesthetize the area at the distal end of the implant. A small stab incision was made right beside the implant on the distal portion.  The Nexplanon rod was grasped using hemostats and removed without difficulty.  There was less than 3 cc blood loss. There were no complications.  Steri-strips were applied over the small incision and a pressure bandage was applied.  The patient tolerated the procedure well. Assessment & Plan:   1) Nexplanon removal She was instructed to keep the area clean and dry, remove pressure bandage in 24 hours, and keep insertion site covered with the steri-strip for 3-5 days.   Follow-up PRN problems.  2) Contraception management> rx LoLoestrin, f/u 69mths, condoms x2wks  No orders of the defined types were placed in this encounter.   Follow-up: Return  in about 3 months (around 08/07/2019) for Pap & physical, f/u on pills, in person.  [place], [facility] 05/09/2019 12:34 PM

## 2019-05-09 NOTE — Patient Instructions (Signed)
Keep the area clean and dry.  You can remove the big bandage in 24 hours, and the small steri-strip bandage in 3-5 days.  A back up method, such as condoms, should be used for two weeks.    Oral Contraception Use Oral contraceptive pills (OCPs) are medicines that you take to prevent pregnancy. OCPs work by:  Preventing the ovaries from releasing eggs.  Thickening mucus in the lower part of the uterus (cervix), which prevents sperm from entering the uterus.  Thinning the lining of the uterus (endometrium), which prevents a fertilized egg from attaching to the endometrium. OCPs are highly effective when taken exactly as prescribed. However, OCPs do not prevent sexually transmitted infections (STIs). Safe sex practices, such as using condoms while on an OCP, can help prevent STIs. Before taking OCPs, you may have a physical exam, blood test, and Pap test. A Pap test involves taking a sample of cells from your cervix to check for cancer. Discuss with your health care provider the possible side effects of the OCP you may be prescribed. When you start an OCP, be aware that it can take 2-3 months for your body to adjust to changes in hormone levels. How to take oral contraceptive pills Follow instructions from your health care provider about how to start taking your first cycle of OCPs. Your health care provider may recommend that you:  Start the pill on day 1 of your menstrual period. If you start at this time, you will not need any backup form of birth control (contraception), such as condoms.  Start the pill on the first Sunday after your menstrual period or on the day you get your prescription. In these cases, you will need to use backup contraception for the first week.  Start the pill at any time of your cycle. ? If you take the pill within 5 days of the start of your period, you will not need a backup form of contraception. ? If you start at any other time of your menstrual cycle, you will need  to use another form of contraception for 7 days. If your OCP is the type called a minipill, it will protect you from pregnancy after taking it for 2 days (48 hours), and you can stop using backup contraception after that time. After you have started taking OCPs:  If you forget to take 1 pill, take it as soon as you remember. Take the next pill at the regular time.  If you miss 2 or more pills, call your health care provider. Different pills have different instructions for missed doses. Use backup birth control until your next menstrual period starts.  If you use a 28-day pack that contains inactive pills and you miss 1 of the last 7 pills (pills with no hormones), throw away the rest of the non-hormone pills and start a new pill pack. No matter which day you start the OCP, you will always start a new pack on that same day of the week. Have an extra pack of OCPs and a backup contraceptive method available in case you miss some pills or lose your OCP pack. Follow these instructions at home:  Do not use any products that contain nicotine or tobacco, such as cigarettes and e-cigarettes. If you need help quitting, ask your health care provider.  Always use a condom to protect against STIs. OCPs do not protect against STIs.  Use a calendar to mark the days of your menstrual period.  Read the information and directions  that came with your OCP. Talk to your health care provider if you have questions. Contact a health care provider if:  You develop nausea and vomiting.  You have abnormal vaginal discharge or bleeding.  You develop a rash.  You miss your menstrual period. Depending on the type of OCP you are taking, this may be a sign of pregnancy. Ask your health care provider for more information.  You are losing your hair.  You need treatment for mood swings or depression.  You get dizzy when taking the OCP.  You develop acne after taking the OCP.  You become pregnant or think you may be  pregnant.  You have diarrhea, constipation, and abdominal pain or cramps.  You miss 2 or more pills. Get help right away if:  You develop chest pain.  You develop shortness of breath.  You have an uncontrolled or severe headache.  You develop numbness or slurred speech.  You develop visual or speech problems.  You develop pain, redness, and swelling in your legs.  You develop weakness or numbness in your arms or legs. Summary  Oral contraceptive pills (OCPs) are medicines that you take to prevent pregnancy.  OCPs do not prevent sexually transmitted infections (STIs). Always use a condom to protect against STIs.  When you start an OCP, be aware that it can take 2-3 months for your body to adjust to changes in hormone levels.  Read all the information and directions that come with your OCP. This information is not intended to replace advice given to you by your health care provider. Make sure you discuss any questions you have with your health care provider. Document Released: 05/04/2011 Document Revised: 09/06/2018 Document Reviewed: 06/26/2016 Elsevier Patient Education  2020 [employer].

## 2019-08-11 ENCOUNTER — Other Ambulatory Visit: Payer: Medicaid Other | Admitting: Women's Health

## 2019-08-26 ENCOUNTER — Telehealth: Payer: Self-pay | Admitting: Women's Health

## 2019-08-26 NOTE — Telephone Encounter (Signed)

## 2019-08-28 ENCOUNTER — Encounter: Payer: Self-pay | Admitting: Women's Health

## 2019-08-28 ENCOUNTER — Ambulatory Visit (INDEPENDENT_AMBULATORY_CARE_PROVIDER_SITE_OTHER): Payer: Medicaid Other | Admitting: Women's Health

## 2019-08-28 ENCOUNTER — Other Ambulatory Visit (HOSPITAL_COMMUNITY)
Admission: RE | Admit: 2019-08-28 | Discharge: 2019-08-28 | Disposition: A | Discharge status: Home / Self Care | Payer: Medicaid Other | Source: Ambulatory Visit | Attending: Obstetrics & Gynecology | Admitting: Obstetrics & Gynecology

## 2019-08-28 ENCOUNTER — Other Ambulatory Visit: Payer: Self-pay

## 2019-08-28 VITALS — BP 132/84 | HR 98 | Ht 64.0 in | Wt 159.5 lb

## 2019-08-28 DIAGNOSIS — F411 Generalized anxiety disorder: Secondary | ICD-10-CM

## 2019-08-28 DIAGNOSIS — Z01419 Encounter for gynecological examination (general) (routine) without abnormal findings: Secondary | ICD-10-CM

## 2019-08-28 DIAGNOSIS — N926 Irregular menstruation, unspecified: Secondary | ICD-10-CM | POA: Diagnosis not present

## 2019-08-28 DIAGNOSIS — Z0000 Encounter for general adult medical examination without abnormal findings: Secondary | ICD-10-CM | POA: Diagnosis not present

## 2019-08-28 MED ORDER — CITALOPRAM HYDROBROMIDE 10 MG PO TABS: 10.0000 mg | ORAL_TABLET | Freq: Every day | ORAL | 6 refills | Status: DC

## 2019-08-28 NOTE — Patient Instructions (Signed)

## 2019-08-28 NOTE — Progress Notes (Signed)
WELL-WOMAN EXAMINATION Patient name: Ann Moore MRN [id number]  Date of birth: 17-May-1991 Chief Complaint:   Annual Exam (2 periods in March)  History of Present Illness:   Ann Moore is a 29 y.o. [phone] Caucasian female being seen today for a routine well-woman exam.  Current complaints: periord 3/8 x 5-6d, then started again few days later which is not normal for her. Didn't miss/skip any pills. Denies abnormal discharge, itching/odor/irritation.  Thinks she may have uti, some back pain.  Anxiety, 'overstimulated', loud noises, etc put her on edge, easily agitated. Wants to try meds. Tried zoloft in past and didn't like the way it made her feel. No dep/SI.   Depression screen [facility] 2/9 08/28/2019  Decreased Interest 0  Down, Depressed, Hopeless 0  PHQ - 2 Score 0  Altered sleeping 1  Change in appetite 0  Feeling bad or failure about yourself  0  Trouble concentrating 0  Moving slowly or fidgety/restless 0  Suicidal thoughts 0  PHQ-9 Score 1  Difficult doing work/chores Not difficult at all     PCP: [facility]      does not desire labs Patient's last menstrual period was 08/04/2019. The current method of family planning is OCP (estrogen/progesterone).  Last pap 03/09/14. Results were: normal. H/O abnormal pap: No Last mammogram: never. Results were: n/a. Family h/o breast cancer: No Last colonoscopy: never. Results were: n/a. Family h/o colorectal cancer: No Review of Systems:   Pertinent items are noted in HPI Denies any headaches, blurred vision, fatigue, shortness of breath, chest pain, abdominal pain, abnormal vaginal discharge/itching/odor/irritation, problems with periods, bowel movements, urination, or intercourse unless otherwise stated above. Pertinent History Reviewed:  Reviewed past medical,surgical, social and family history.  Reviewed problem list, medications and allergies. Physical Assessment:   Vitals:   08/28/19 1431  BP: 132/84    Pulse: 98  Weight: 159 lb 8 oz (72.3 kg)  Height: 5\' 4"  (1.626 m)  Body mass index is 27.38 kg/m.        Physical Examination:   General appearance - well appearing, and in no distress  Mental status - alert, oriented to person, place, and time  Psych:  She has a normal mood and affect  Skin - warm and dry, normal color, no suspicious lesions noted  Chest - effort normal, all lung fields clear to auscultation bilaterally  Heart - normal rate and regular rhythm  Neck:  midline trachea, no thyromegaly or nodules  Breasts - breasts appear normal, no suspicious masses, no skin or nipple changes or  axillary nodes  Abdomen - soft, nontender, nondistended, no masses or organomegaly  Pelvic - VULVA: normal appearing vulva with no masses, tenderness or lesions  VAGINA: normal appearing vagina with normal color and discharge, no lesions  CERVIX: normal appearing cervix without discharge or lesions, no CMT  Thin prep pap is done w/ HR HPV cotesting  UTERUS: uterus is felt to be normal size, shape, consistency and nontender   ADNEXA: No adnexal masses or tenderness noted.  Extremities:  No swelling or varicosities noted  Chaperone:    No results found for this or any previous visit (from the past 24 hour(s)).  Assessment & Plan:  1) Well-Woman Exam  2) Anxiety> rx celexa 10mg , f/u 4wks  3) 2 periods in March> check gc/ct from pap, monitor periods, let me know if continues  Labs/procedures today: pap  Mammogram @28yo  or sooner if problems Colonoscopy @28yo  or sooner if problems  No orders of the defined types were placed in this encounter.   Meds:  Meds ordered this encounter  Medications  . citalopram (CELEXA) 10 MG tablet    Sig: Take 1 tablet (10 mg total) by mouth daily.    Dispense:  30 tablet    Refill:  6    Order Specific Question:   Supervising Provider    Answer:   Florian Buff [[id number]]    Follow-up: Return in about 4 weeks (around 09/25/2019) for F/U,  with me.  [place], [facility] 08/28/2019 2:55 PM

## 2019-09-05 LAB — CYTOLOGY - PAP
Chlamydia: NEGATIVE
Comment: NEGATIVE
Comment: NEGATIVE
Comment: NEGATIVE
Comment: NORMAL
Diagnosis: UNDETERMINED — AB
HPV 16: NEGATIVE
HPV 18 / 45: NEGATIVE
High risk HPV: POSITIVE — AB
Neisseria Gonorrhea: NEGATIVE

## 2019-09-08 ENCOUNTER — Encounter: Payer: Self-pay | Admitting: Women's Health

## 2019-09-08 DIAGNOSIS — R87619 Unspecified abnormal cytological findings in specimens from cervix uteri: Secondary | ICD-10-CM

## 2019-09-08 HISTORY — DX: Unspecified abnormal cytological findings in specimens from cervix uteri: R87.619

## 2019-09-25 ENCOUNTER — Encounter: Payer: Self-pay | Admitting: Women's Health

## 2019-09-25 ENCOUNTER — Encounter: Payer: Self-pay | Admitting: Family Medicine

## 2019-09-25 ENCOUNTER — Ambulatory Visit (INDEPENDENT_AMBULATORY_CARE_PROVIDER_SITE_OTHER): Payer: Medicaid Other | Admitting: Family Medicine

## 2019-09-25 ENCOUNTER — Telehealth (INDEPENDENT_AMBULATORY_CARE_PROVIDER_SITE_OTHER): Payer: Medicaid Other | Admitting: Women's Health

## 2019-09-25 ENCOUNTER — Other Ambulatory Visit: Payer: Self-pay

## 2019-09-25 VITALS — BP 133/90 | HR 94 | Temp 97.8°F | Ht 64.0 in | Wt 154.0 lb

## 2019-09-25 DIAGNOSIS — F411 Generalized anxiety disorder: Secondary | ICD-10-CM

## 2019-09-25 DIAGNOSIS — R519 Headache, unspecified: Secondary | ICD-10-CM | POA: Diagnosis not present

## 2019-09-25 DIAGNOSIS — I10 Essential (primary) hypertension: Secondary | ICD-10-CM | POA: Insufficient documentation

## 2019-09-25 DIAGNOSIS — F410 Panic disorder [episodic paroxysmal anxiety] without agoraphobia: Secondary | ICD-10-CM | POA: Insufficient documentation

## 2019-09-25 DIAGNOSIS — R87610 Atypical squamous cells of undetermined significance on cytologic smear of cervix (ASC-US): Secondary | ICD-10-CM | POA: Diagnosis not present

## 2019-09-25 DIAGNOSIS — F419 Anxiety disorder, unspecified: Secondary | ICD-10-CM | POA: Insufficient documentation

## 2019-09-25 DIAGNOSIS — E663 Overweight: Secondary | ICD-10-CM | POA: Insufficient documentation

## 2019-09-25 DIAGNOSIS — F1290 Cannabis use, unspecified, uncomplicated: Secondary | ICD-10-CM | POA: Diagnosis not present

## 2019-09-25 DIAGNOSIS — G479 Sleep disorder, unspecified: Secondary | ICD-10-CM | POA: Diagnosis not present

## 2019-09-25 DIAGNOSIS — Z720 Tobacco use: Secondary | ICD-10-CM | POA: Insufficient documentation

## 2019-09-25 MED ORDER — RIZATRIPTAN BENZOATE 5 MG PO TABS: 5.0000 mg | ORAL_TABLET | ORAL | 0 refills | Status: DC | PRN

## 2019-09-25 MED ORDER — CITALOPRAM HYDROBROMIDE 20 MG PO TABS: 20.0000 mg | ORAL_TABLET | Freq: Every day | ORAL | 11 refills | Status: DC

## 2019-09-25 MED ORDER — BUSPIRONE HCL 5 MG PO TABS: 5.0000 mg | ORAL_TABLET | Freq: Two times a day (BID) | ORAL | 2 refills | Status: DC

## 2019-09-25 NOTE — Progress Notes (Signed)
[facility] VIRTUAL GYN VISIT ENCOUNTER NOTE Patient name: Ann Moore MRN [id number]  Date of birth: [birth year]  I connected with patient on 09/25/19 at 11:10 AM EDT by MyChart video and verified that I am speaking with the correct person using two identifiers.  Due to COVID-19 recommendations, pt is not currently in the office.    I discussed the limitations, risks, security and privacy concerns of performing an evaluation and management service by telephone and the availability of in person appointments. I also discussed with the patient that there may be a patient responsible charge related to this service. The patient expressed understanding and agreed to proceed.   Chief Complaint:   Follow-up (Celexa-"I don't think it's working")  History of Present Illness:   Ann Moore is a 29 y.o. [phone] Caucasian female being evaluated today for f/u on celexa 10mg  for anxiety. Doesn't feel like it's helping yet. Trouble sleeping.  Depression screen [facility] 2/9 08/28/2019  Decreased Interest 0  Down, Depressed, Hopeless 0  PHQ - 2 Score 0  Altered sleeping 1  Change in appetite 0  Feeling bad or failure about yourself  0  Trouble concentrating 0  Moving slowly or fidgety/restless 0  Suicidal thoughts 0  PHQ-9 Score 1  Difficult doing work/chores Not difficult at all    No LMP recorded. The current method of family planning is oral progesterone-only contraceptive.  Last pap 08/28/19. Results were:  abnormal  ASCUS w/ +HRHPV, needs colpo Review of Systems:   Pertinent items are noted in HPI Denies fever/chills, dizziness, headaches, visual disturbances, fatigue, shortness of breath, chest pain, abdominal pain, vomiting, abnormal vaginal discharge/itching/odor/irritation, problems with periods, bowel movements, urination, or intercourse unless otherwise stated above.  Pertinent History Reviewed:  Reviewed past medical,surgical, social, obstetrical and family history.  Reviewed problem  list, medications and allergies. Physical Assessment:  There were no vitals filed for this visit.There is no height or weight on file to calculate BMI.       Physical Examination:   General:  Alert, oriented and cooperative.   Mental Status: Normal mood and affect perceived. Normal judgment and thought content.  Physical exam deferred due to nature of the encounter  No results found for this or any previous visit (from the past 24 hour(s)).  Assessment & Plan:  1) Anxiety> can't tell difference w/ celexa 10mg >increase to 20mg  and f/u in 4wks  2) Trouble sleeping> gave printed prevention/relief measures, can try benadryl, unisom, tylenol pm  3) Abnorma pap> needs colpo  Meds:  Meds ordered this encounter  Medications  . citalopram (CELEXA) 20 MG tablet    Sig: Take 1 tablet (20 mg total) by mouth daily.    Dispense:  30 tablet    Refill:  11    Order Specific Question:   Supervising Provider    Answer:   Tania Ade H [[id number]]    No orders of the defined types were placed in this encounter.   I discussed the assessment and treatment plan with the patient. The patient was provided an opportunity to ask questions and all were answered. The patient agreed with the plan and demonstrated an understanding of the instructions.   The patient was advised to call back or seek an in-person evaluation/go to the ED if the symptoms worsen or if the condition fails to improve as anticipated.  I provided 15 minutes of non-face-to-face time during this encounter.   Return in about 4 weeks (around 10/23/2019) for colpo and med f/u  w/ MD.  Cheral Marker CNM, WHNP-BC 09/25/2019 11:22 AM

## 2019-09-25 NOTE — Patient Instructions (Signed)
Tips to Help You Sleep Better:  ?Get into a bedtime routine, try to do the same thing every night before going to bed to try to help your body wind down ?Warm baths ?Avoid caffeine for at least 3 hours before going to sleep  ?Keep your room at a slightly cooler temperature, can try running a fan ?Turn off TV, lights, phone, electronics ?Lots of pillows if needed to help you get comfortable ?Lavender scented items can help you sleep. You can place lavender essential oil on a cotton ball and place under your pillowcase, or place in a diffuser. Febreeze has a lavender scented sleep line (plug-ins, sprays, etc). Look in the pillow aisle for lavender scented pillows.  ?If none of the above things help, you can try 1/2 to 1 tablet of benadryl, unisom, or tylenol pm. Do not take this every night, only when you really need it.   ?

## 2019-09-25 NOTE — Assessment & Plan Note (Signed)
Encouraged to be heart healthy, active and to maintain a good healthy weight.

## 2019-09-25 NOTE — Patient Instructions (Addendum)
I appreciate the opportunity to provide you with care for your health and wellness. Today we discussed: establish   Follow up: 6 weeks for headache, anxiety, and BP check  Labs today  No referrals today  Start Buspar daily   Use Maxalt as needed at onset of Migraine  Onset of Headache take 1,000 mg and 600-800 mg of ibuprofen with caffeine   Please continue to practice social distancing to keep you, your family, and our community safe.  If you must go out, please wear a mask and practice good handwashing.  It was a pleasure to see you and I look forward to continuing to work together on your health and well-being. Please do not hesitate to call the office if you need care or have questions about your care.  Have a wonderful day and week. With Gratitude, Tereasa Coop, DNP, AGNP-BC

## 2019-09-25 NOTE — Assessment & Plan Note (Signed)
Intermittent use She is encouraged to not use drugs.

## 2019-09-25 NOTE — Assessment & Plan Note (Addendum)
Has never been diagnosed with high blood pressure.  But in review of her previous charts and office visit it looks like she has had consistent upper 120s 130s over the 80s to 90s.  Possible cause of ongoing migraines and headaches.  Has not ever been treated for high blood pressure.  Does have a family history of high blood pressure.  Is willing to go on something if this continues. Extensive education regarding the use of exercise and lifestyle changes with diet changes including the reduction in salt and fried foods and processed foods.

## 2019-09-25 NOTE — Assessment & Plan Note (Addendum)
Lexapro did not do any good for her so she was put on Celexa.  She was increased dose of Celexa today.  I have started her on buspirone to use as well.  For the next 2 to 3 months to see if that helps get the Celexa into a steady process.  Advised for her to take his medications as directed and not miss days.  I have also advised her to not stop them abruptly. GAD is a 8 denies having any SI or HI today in the office.  Close follow-up in 6 weeks.

## 2019-09-25 NOTE — Progress Notes (Signed)
Subjective:  Patient ID: Ann Moore, female    DOB: 1990/11/13  Age: 29 y.o. MRN: [id number]  CC:  Chief Complaint  Patient presents with  . New Patient (Initial Visit)    hasnt been to dr in a long time is having migraines on and off for years       HPI   [name] is a 29 year old female patient who presents today to establish care.  Previous care was only provided by [facility]  Whom she does get her women's health and family planning care from. Limited history includes anxiety, headaches, abnormal Pap smear recently due for colposcopy in May. Occasional alcohol use occasional marijuana use does smoke cigarettes daily.  Has 2 sons one lives with the father in [place] whom she sees but share custody her youngest lives with her father is in the picture intermittently.  Biggest concerns are headaches and her anxiety.  She recently earlier today saw her midwife and was increased on her dosage of Celexa.  She asked extensive questions regarding medication use.  Is willing to have adjustments for additional medication.  Headache  This is a chronic problem. The current episode started more than 1 year ago. The problem occurs intermittently. The problem has been waxing and waning. The pain is located in the frontal region. The pain radiates to the right neck and left neck. The pain quality is similar to prior headaches. The quality of the pain is described as throbbing, stabbing and pulsating. The symptoms are aggravated by bright light, emotional stress, menstrual cycle, caffeine withdrawal, hunger, fatigue, noise and weather changes. She has tried acetaminophen, darkened room, Excedrin and NSAIDs for the symptoms. The treatment provided no relief.   Outside of headaches and anxiety she does not have anything else to discuss today. She is willing to get some updated labs.  Today patient denies signs and symptoms of COVID 19 infection including fever, chills, cough,  shortness of breath, and headache. Past Medical, Surgical, Social History, Allergies, and Medications have been Reviewed.   Past Medical History:  Diagnosis Date  . Abnormal Pap smear of cervix 09/08/2019   08/28/19: ASCUS w/ +HRHPV   Per 2019 ASCCP guidelines, needs colposcopy as soon as possible.  Immediate risk of CIN3+ is 4.4%.   Marland Kitchen Anxiety   . Headache   . Missed abortion 03/15/2014  . Nexplanon insertion 05/03/2016   Placed 05/03/16    05/09/19 removed  . Pregnant 08/20/2015  . Rubella non-immune status, antepartum 03/10/2014   MMR pp   . Trichomonal vaginitis 04/29/2015   04/29/2015   POC: neg 07/2015 Repeat positive 10/08/15, POC neg 10/26/2015 04/04/16 + again.    Marland Kitchen UTI (lower urinary tract infection) 08/20/2015  . Vaginal discharge 08/20/2015    Current Meds  Medication Sig  . acetaminophen (TYLENOL) 325 MG tablet Take 650 mg by mouth every 6 (six) hours as needed for mild pain, moderate pain or headache.   . citalopram (CELEXA) 20 MG tablet Take 1 tablet (20 mg total) by mouth daily.  . LO LOESTRIN FE 1 MG-10 MCG / 10 MCG tablet Take 1 tablet by mouth daily.    ROS:  Review of Systems  Constitutional: Negative.   HENT: Negative.   Respiratory: Negative.   Cardiovascular: Negative.   Genitourinary: Negative.   Musculoskeletal: Negative.   Skin: Negative.   Neurological: Positive for headaches.  Endo/Heme/Allergies: Negative.   Psychiatric/Behavioral: Negative.   All other systems reviewed and are negative.  Objective:   Today's Vitals: BP 133/90 (BP Location: Right Arm, Patient Position: Sitting, Cuff Size: Normal)   Pulse 94   Temp 97.8 F (36.6 C) (Temporal)   Ht '5\' 4"'  (1.626 m)   Wt 154 lb (69.9 kg)   LMP 09/15/2019 (Approximate)   SpO2 96%   BMI 26.43 kg/m  Vitals with BMI 09/25/2019 08/28/2019 05/09/2019  Height '5\' 4"'  '5\' 4"'  '5\' 4"'   Weight 154 lbs 159 lbs 8 oz 144 lbs  BMI 26.42 54.62 70.35  Systolic 009 381 829  Diastolic 90 84 78  Pulse 94 98 90      Physical Exam Vitals and nursing note reviewed.  Constitutional:      Appearance: Normal appearance. She is well-developed, well-groomed and overweight.  HENT:     Head: Normocephalic and atraumatic.     Right Ear: External ear normal.     Left Ear: External ear normal.     Nose: Nose normal.     Mouth/Throat:     Mouth: Mucous membranes are moist.     Pharynx: Oropharynx is clear.     Comments: Mask in place  Eyes:     General:        Right eye: No discharge.        Left eye: No discharge.     Conjunctiva/sclera: Conjunctivae normal.  Cardiovascular:     Rate and Rhythm: Normal rate and regular rhythm.     Pulses: Normal pulses.     Heart sounds: Normal heart sounds.  Pulmonary:     Effort: Pulmonary effort is normal.     Breath sounds: Normal breath sounds.  Musculoskeletal:        General: Normal range of motion.     Cervical back: Normal range of motion and neck supple.  Skin:    General: Skin is warm.  Neurological:     General: No focal deficit present.     Mental Status: She is alert and oriented to person, place, and time.  Psychiatric:        Attention and Perception: Attention normal.        Mood and Affect: Mood normal.        Speech: Speech normal.        Behavior: Behavior normal. Behavior is cooperative.        Thought Content: Thought content normal.        Cognition and Memory: Cognition normal.        Judgment: Judgment normal.     GAD 7 : Generalized Anxiety Score 09/25/2019 08/28/2019  Nervous, Anxious, on Edge 2 1  Control/stop worrying 1 1  Worry too much - different things 1 1  Trouble relaxing 0 0  Restless 3 0  Easily annoyed or irritable 1 1  Afraid - awful might happen 0 0  Total GAD 7 Score 8 4  Anxiety Difficulty Not difficult at all Somewhat difficult      Assessment   1. Generalized headaches   2. Anxiety   3. Essential hypertension   4. Marijuana use   5. Nicotine abuse   6. Overweight (BMI 25.0-29.9)   7. Generalized  anxiety disorder     Tests ordered Orders Placed This Encounter  Procedures  . CBC  . COMPLETE METABOLIC PANEL WITH GFR     Plan: Please see assessment and plan per problem list above.   Meds ordered this encounter  Medications  . busPIRone (BUSPAR) 5 MG tablet    Sig: Take 1  tablet (5 mg total) by mouth 2 (two) times daily.    Dispense:  60 tablet    Refill:  2    Order Specific Question:   Supervising Provider    Answer:   SIMPSON, MARGARET E [[id number]]  . rizatriptan (MAXALT) 5 MG tablet    Sig: Take 1 tablet (5 mg total) by mouth as needed for migraine. May repeat in 2 hours if needed    Dispense:  10 tablet    Refill:  0    Order Specific Question:   Supervising Provider    Answer:   Fayrene Helper [[id number]]    Patient to follow-up in 6 weeks   Perlie Mayo, NP

## 2019-09-25 NOTE — Assessment & Plan Note (Addendum)
Asked about quitting: confirms they are currently smokes cigarettes Advise to quit smoking: Educated about QUITTING to reduce the risk of cancer, cardio and cerebrovascular disease. Assess willingness: Unwilling to quit at this time, but is working on cutting back. Assist with counseling and pharmacotherapy: Counseled for 5 minutes and literature provided. Arrange for follow up: not quitting follow up in 3 months and continue to offer help.   She is encouraged to smoke 1 less cigarette a day.  And counseled about trying to quit.  Advised that she can always reach out if she wants help doing this.  Also advised that this plays a role in high blood pressure.

## 2019-09-26 LAB — COMPLETE METABOLIC PANEL WITH GFR
AG Ratio: 2.2 (calc) (ref 1.0–2.5)
ALT: 10 U/L (ref 6–29)
AST: 14 U/L (ref 10–30)
Albumin: 4.9 g/dL (ref 3.6–5.1)
Alkaline phosphatase (APISO): 70 U/L (ref 31–125)
BUN/Creatinine Ratio: 7 (calc) (ref 6–22)
BUN: 5 mg/dL — ABNORMAL LOW (ref 7–25)
CO2: 27 mmol/L (ref 20–32)
Calcium: 9.9 mg/dL (ref 8.6–10.2)
Chloride: 104 mmol/L (ref 98–110)
Creat: 0.75 mg/dL (ref 0.50–1.10)
GFR, Est African American: 127 mL/min/{1.73_m2} (ref >=60)
GFR, Est Non African American: 109 mL/min/{1.73_m2} (ref >=60)
Globulin: 2.2 g/dL (calc) (ref 1.9–3.7)
Glucose, Bld: 95 mg/dL (ref 65–139)
Potassium: 4.2 mmol/L (ref 3.5–5.3)
Sodium: 138 mmol/L (ref 135–146)
Total Bilirubin: 0.6 mg/dL (ref 0.2–1.2)
Total Protein: 7.1 g/dL (ref 6.1–8.1)

## 2019-09-26 LAB — CBC
HCT: 45.9 % — ABNORMAL HIGH (ref 35.0–45.0)
Hemoglobin: 15.1 g/dL (ref 11.7–15.5)
MCH: 29.5 pg (ref 27.0–33.0)
MCHC: 32.9 g/dL (ref 32.0–36.0)
MCV: 89.6 fL (ref 80.0–100.0)
MPV: 11.3 fL (ref 7.5–12.5)
Platelets: 207 10*3/uL (ref 140–400)
RBC: 5.12 10*6/uL — ABNORMAL HIGH (ref 3.80–5.10)
RDW: 12.8 % (ref 11.0–15.0)
WBC: 10.2 10*3/uL (ref 3.8–10.8)

## 2019-09-29 NOTE — Progress Notes (Signed)
LVM for pt to call the office.

## 2019-10-14 DIAGNOSIS — H5213 Myopia, bilateral: Secondary | ICD-10-CM | POA: Diagnosis not present

## 2019-10-21 ENCOUNTER — Encounter: Payer: Medicaid Other | Admitting: Obstetrics & Gynecology

## 2019-10-31 ENCOUNTER — Ambulatory Visit: Payer: Medicaid Other | Admitting: Obstetrics & Gynecology

## 2019-10-31 ENCOUNTER — Encounter: Payer: Self-pay | Admitting: Obstetrics & Gynecology

## 2019-10-31 VITALS — BP 127/87 | HR 88 | Ht 64.0 in | Wt 152.0 lb

## 2019-10-31 DIAGNOSIS — R87610 Atypical squamous cells of undetermined significance on cytologic smear of cervix (ASC-US): Secondary | ICD-10-CM

## 2019-10-31 DIAGNOSIS — Z3202 Encounter for pregnancy test, result negative: Secondary | ICD-10-CM | POA: Diagnosis not present

## 2019-10-31 DIAGNOSIS — R87810 Cervical high risk human papillomavirus (HPV) DNA test positive: Secondary | ICD-10-CM

## 2019-10-31 LAB — POCT URINE PREGNANCY: Preg Test, Ur: NEGATIVE

## 2019-10-31 NOTE — Progress Notes (Signed)
Colposcopy Procedure Note:  Colposcopy Procedure Note  Indications:  2021   ASCUS/untyped HR HPV positive Previous Pap 2015    2019 ASCCP recommendation:  Smoker:  Yes, 1/2 ppd New sexual partner:  no    History of abnormal Pap: no  Procedure Details  The risks and benefits of the procedure and Written informed consent obtained.  Speculum placed in vagina and excellent visualization of cervix achieved, cervix swabbed x 3 with acetic acid solution.  Findings: Cervix: no mosaicism, no punctation, no abnormal vasculature, visible lesion(s) at 12 o'clock and acetowhite lesion(s) noted at 12 o'clock; no biopsies taken. Vaginal inspection: normal without visible lesions. Vulvar colposcopy: vulvar colposcopy not performed.  Specimens: none  Complications: none.  Plan(Based on 2019 ASCCP recommendations) Follow up HPV based cytology in 1 year (please perform differentiated 16/18)

## 2019-11-06 ENCOUNTER — Encounter: Payer: Self-pay | Admitting: Family Medicine

## 2019-11-06 ENCOUNTER — Other Ambulatory Visit: Payer: Self-pay

## 2019-11-06 ENCOUNTER — Ambulatory Visit: Payer: Medicaid Other | Admitting: Family Medicine

## 2019-11-06 VITALS — BP 116/78 | HR 76 | Temp 97.9°F | Ht 64.0 in | Wt 155.4 lb

## 2019-11-06 DIAGNOSIS — F1290 Cannabis use, unspecified, uncomplicated: Secondary | ICD-10-CM

## 2019-11-06 DIAGNOSIS — F411 Generalized anxiety disorder: Secondary | ICD-10-CM

## 2019-11-06 DIAGNOSIS — F909 Attention-deficit hyperactivity disorder, unspecified type: Secondary | ICD-10-CM

## 2019-11-06 MED ORDER — BUSPIRONE HCL 5 MG PO TABS: 5.0000 mg | ORAL_TABLET | Freq: Three times a day (TID) | ORAL | 1 refills | Status: DC

## 2019-11-06 MED ORDER — BUPROPION HCL ER (XL) 150 MG PO TB24: 150.0000 mg | ORAL_TABLET | Freq: Every day | ORAL | 2 refills | Status: DC

## 2019-11-06 NOTE — Patient Instructions (Signed)
I appreciate the opportunity to provide you with care for your health and wellness. Today we discussed: ADHD  Follow up: 6-8 weeks   No labs or referrals today  Start taking medication as directed.   Please continue to practice social distancing to keep you, your family, and our community safe.  If you must go out, please wear a mask and practice good handwashing.  It was a pleasure to see you and I look forward to continuing to work together on your health and well-being. Please do not hesitate to call the office if you need care or have questions about your care.  Have a wonderful day and week. With Gratitude, Tereasa Coop, DNP, AGNP-BC

## 2019-11-06 NOTE — Progress Notes (Signed)
Subjective:  Patient ID: Ann Moore, female    DOB: 07/12/1990  Age: 29 y.o. MRN: [id number]  CC:  Chief Complaint  Patient presents with  . Follow-up    6 week follow up headache is better only had to take 1 migraine pill anxiety is better but still feels on the edge bp is much better       HPI  HPI   Ann Moore is a 29 year old female patient who presents today for follow-up on headaches, anxiety and blood pressure. Blood pressure is much better controlled.  Reports that she has been trying to eat better and watch what she eats to help with blood pressure control.  As she does not like on any medication at this time for that.  She reports a definite increase in improvement of headaches and migraines.  She is only had to use Maxalt 1 time.  Was started on Celexa and BuSpar as well.  Possible correlation between anxiety and migraines.  Her biggest complaint however is that she is having sensory overload.  She reports that she is having trouble with her son banking screaming playing even if is not something that she thinks she should respond to she feels very aggravated annoyed with it.  She has trouble with certain textures such as cotton balls and towels.  And foods as well.  She has never been diagnosed with ADHD.  However she has reviewed some of the symptoms and she feels that this might be part of the issue.  She is willing to have an adjustment in her medications today and if not improved will go to a therapist if needed.  She denies having any other issues or concerns.  Today patient denies signs and symptoms of COVID 19 infection including fever, chills, cough, shortness of breath, and headache. Past Medical, Surgical, Social History, Allergies, and Medications have been Reviewed.   Past Medical History:  Diagnosis Date  . Abnormal Pap smear of cervix 09/08/2019   08/28/19: ASCUS w/ +HRHPV   Per 2019 ASCCP guidelines, needs colposcopy as soon as possible.  Immediate risk  of CIN3+ is 4.4%.   Marland Kitchen Anxiety   . Headache   . Missed abortion 03/15/2014  . Nexplanon insertion 05/03/2016   Placed 05/03/16    05/09/19 removed  . Pregnant 08/20/2015  . Rubella non-immune status, antepartum 03/10/2014   MMR pp   . Trichomonal vaginitis 04/29/2015   04/29/2015   POC: neg 07/2015 Repeat positive 10/08/15, POC neg 10/26/2015 04/04/16 + again.    Marland Kitchen UTI (lower urinary tract infection) 08/20/2015  . Vaginal discharge 08/20/2015    Current Meds  Medication Sig  . acetaminophen (TYLENOL) 325 MG tablet Take 650 mg by mouth every 6 (six) hours as needed for mild pain, moderate pain or headache.   . busPIRone (BUSPAR) 5 MG tablet Take 1 tablet (5 mg total) by mouth 3 (three) times daily.  . LO LOESTRIN FE 1 MG-10 MCG / 10 MCG tablet Take 1 tablet by mouth daily.  . rizatriptan (MAXALT) 5 MG tablet Take 1 tablet (5 mg total) by mouth as needed for migraine. May repeat in 2 hours if needed  . [DISCONTINUED] busPIRone (BUSPAR) 5 MG tablet Take 1 tablet (5 mg total) by mouth 2 (two) times daily.  . [DISCONTINUED] citalopram (CELEXA) 20 MG tablet Take 1 tablet (20 mg total) by mouth daily.    ROS:  Review of Systems  Constitutional: Negative.   HENT: Negative.  Eyes: Negative.   Respiratory: Negative.   Cardiovascular: Negative.   Gastrointestinal: Negative.   Genitourinary: Negative.   Musculoskeletal: Negative.   Skin: Negative.   Neurological: Negative.   Endo/Heme/Allergies: Negative.   Psychiatric/Behavioral:       See HPI  All other systems reviewed and are negative.    Objective:   Today's Vitals: BP 116/78 (BP Location: Right Arm, Patient Position: Sitting, Cuff Size: Normal)   Pulse 76   Temp 97.9 F (36.6 C) (Temporal)   Ht '5\' 4"'  (1.626 m)   Wt 155 lb 6.4 oz (70.5 kg)   LMP 10/11/2019   SpO2 99%   BMI 26.67 kg/m  Vitals with BMI 11/06/2019 10/31/2019 09/25/2019  Height '5\' 4"'  '5\' 4"'  '5\' 4"'   Weight 155 lbs 6 oz 152 lbs 154 lbs  BMI 26.66 93.81 82.99    Systolic 371 696 789  Diastolic 78 87 90  Pulse 76 88 94     Physical Exam Vitals and nursing note reviewed.  Constitutional:      Appearance: Normal appearance. She is well-developed, well-groomed and overweight.  HENT:     Head: Normocephalic and atraumatic.     Right Ear: External ear normal.     Left Ear: External ear normal.     Mouth/Throat:     Comments: Mask in place Eyes:     General:        Right eye: No discharge.        Left eye: No discharge.     Conjunctiva/sclera: Conjunctivae normal.  Cardiovascular:     Rate and Rhythm: Normal rate and regular rhythm.     Pulses: Normal pulses.     Heart sounds: Normal heart sounds.  Pulmonary:     Effort: Pulmonary effort is normal.     Breath sounds: Normal breath sounds.  Musculoskeletal:        General: Normal range of motion.     Cervical back: Normal range of motion and neck supple.  Skin:    General: Skin is warm.  Neurological:     General: No focal deficit present.     Mental Status: She is alert and oriented to person, place, and time.  Psychiatric:        Attention and Perception: Attention normal.        Mood and Affect: Mood normal.        Speech: Speech normal.        Behavior: Behavior normal. Behavior is cooperative.        Thought Content: Thought content normal.        Cognition and Memory: Cognition normal.        Judgment: Judgment normal.     Comments: A little anxious in conversation, fidgety.  But overall appropriate and good communication and eye contact.     Adult ADHD Self Report Scale (most recent)    Adult ADHD Self-Report Scale (ASRS-v1.1) Symptom Checklist - 11/06/19 1009      Part A   1. How often do you have trouble wrapping up the final details of a project, once the challenging parts have been done? Never  2. How often do you have difficulty getting things done in order when you have to do a task that requires organization? Sometimes    3. How often do you have problems  remembering appointments or obligations? Often  4. When you have a task that requires a lot of thought, how often do you avoid or delay getting started? Sometimes  5. How often do you fidget or squirm with your hands or feet when you have to sit down for a long time? Often  6. How often do you feel overly active and compelled to do things, like you were driven by a motor? Sometimes      Part B   7. How often do you make careless mistakes when you have to work on a boring or difficult project? Sometimes  8. How often do you have difficulty keeping your attention when you are doing boring or repetitive work? Sometimes    9. How often do you have difficulty concentrating on what people say to you, even when they are speaking to you directly? Often  10. How often do you misplace or have difficulty finding things at home or at work? Rarely    11. How often are you distracted by activity or noise around you? Very Often  9. How often do you leave your seat in meetings or other situations in which you are expected to remain seated? Sometimes    13. How often do you feel restless or fidgety? Very Often  75. How often do you have difficulty unwinding and relaxing when you have time to yourself? Sometimes    15. How often do you find yourself talking too much when you are in social situations? Often  16. When you are in a conversation, how often do you find yourself finishing the sentences of the people you are talking to, before they can finish them themselves? Sometimes    17. How often do you have difficulty waiting your turn in situations when turn taking is required? Never  18. How often do you interrupt others when they are busy? Sometimes      Comment   How old were you when these problems first began to occur? 24               Assessment   1. Adult ADHD   2. Marijuana use   3. Generalized anxiety disorder     Tests ordered No orders of the defined types were placed in this  encounter.  Plan: Please see assessment and plan per problem list above.   Meds ordered this encounter  Medications  . buPROPion (WELLBUTRIN XL) 150 MG 24 hr tablet    Sig: Take 1 tablet (150 mg total) by mouth daily.    Dispense:  30 tablet    Refill:  2    Order Specific Question:   Supervising Provider    Answer:   SIMPSON, MARGARET E [[id number]]  . busPIRone (BUSPAR) 5 MG tablet    Sig: Take 1 tablet (5 mg total) by mouth 3 (three) times daily.    Dispense:  90 tablet    Refill:  1    Order Specific Question:   Supervising Provider    Answer:   Jacklynn Bue    Patient to follow-up in 12/19/2019  Perlie Mayo, NP

## 2019-11-06 NOTE — Assessment & Plan Note (Signed)
She was encouraged to not self-medicate. Focus on self care options and medications prescribed

## 2019-11-06 NOTE — Assessment & Plan Note (Signed)
Changing from Celexa to Wellbutrin for co-treatment of ADHD as well. Anxiety is improved. Denies SI and HI.

## 2019-11-06 NOTE — Assessment & Plan Note (Signed)
Assessment today and screening questions adult ADHD.  Will try adjustment of medications to see if we can get this under control.  She is willing to go to the therapist to make sure that she is treated appropriately if my treatments do not help.

## 2019-11-17 ENCOUNTER — Telehealth: Payer: Self-pay

## 2019-11-17 ENCOUNTER — Telehealth: Payer: Self-pay | Admitting: *Deleted

## 2019-11-17 NOTE — Telephone Encounter (Signed)
Pt called crying saying that the medication for her anxiety was not working. She stated she was on the edge and couldn't take it. I let her know that Dahlia Client was out of the office but she needed to go to urgent care or ER. She stated she would try to get up there she was more just worried about crying for no reason. I let her know we would call her once the provider came back in but she really needed to go get checked out with verbal understanding

## 2019-11-18 NOTE — Telephone Encounter (Signed)
Patient scheduled for Thursday at 11am

## 2019-11-18 NOTE — Telephone Encounter (Signed)
I think it would be good to move her appointment up if she can be fit in this week that would be great.

## 2019-11-18 NOTE — Telephone Encounter (Signed)
Thank you :)

## 2019-11-20 ENCOUNTER — Encounter: Payer: Self-pay | Admitting: Family Medicine

## 2019-11-20 ENCOUNTER — Telehealth (INDEPENDENT_AMBULATORY_CARE_PROVIDER_SITE_OTHER): Payer: Medicaid Other | Admitting: Family Medicine

## 2019-11-20 VITALS — BP 116/78 | Ht 65.0 in | Wt 155.0 lb

## 2019-11-20 DIAGNOSIS — F909 Attention-deficit hyperactivity disorder, unspecified type: Secondary | ICD-10-CM | POA: Diagnosis not present

## 2019-11-20 DIAGNOSIS — F411 Generalized anxiety disorder: Secondary | ICD-10-CM | POA: Diagnosis not present

## 2019-11-20 MED ORDER — HYDROXYZINE HCL 10 MG PO TABS: 10.0000 mg | ORAL_TABLET | Freq: Three times a day (TID) | ORAL | 1 refills | Status: DC | PRN

## 2019-11-20 NOTE — Assessment & Plan Note (Signed)
She stopped taking the Wellbutrin.  Has tried several SSRIs without much benefit.  Unsure if we are treating the right diagnosis.  She is willing to go to therapy.  I will start her on hydroxyzine to help with anxiety management.  She reports that the BuSpar is not helping her as well.  She denies having any SI or HI at this time.

## 2019-11-20 NOTE — Assessment & Plan Note (Signed)
She did report that she had issues with sensory input.  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  I tried to start her on Wellbutrin to help cross control mood and symptoms.  However she reports that she feels like she got worse with this.  I am sending her to therapist to see if we can get better evaluation to make sure we are treating the appropriate diagnosis.

## 2019-11-20 NOTE — Progress Notes (Signed)
Virtual Visit via Video Note   This visit type was conducted due to national recommendations for restrictions regarding the COVID-19 Pandemic (e.g. social distancing) in an effort to limit this patient's exposure and mitigate transmission in our community.  Due to her co-morbid illnesses, this patient is at least at moderate risk for complications without adequate follow up.  This format is felt to be most appropriate for this patient at this time.   All issues noted in this document were discussed and addressed. Limited physical exam could be performed with this format.    Evaluation Performed:  Follow-up visit  Date:  11/20/2019   ID:  Ann Moore, DOB Mar 15, 1991, MRN [id number]  Patient Location: Home Provider Location: Office  Location of Patient: Home Location of Provider: Telehealth Consent was obtain for visit to be over via telehealth. I verified that I am speaking with the correct person using two identifiers.  PCP:  Perlie Mayo, NP   Chief Complaint: anxiety and mood   History of Present Illness:    Ann Moore is a 29 y.o. female with anxiety questionable ADHD.  Here today for follow-up regarding the start of medication Wellbutrin to help with crossover of ADHD and anxiety.  She reports that she did not do well with this and started feeling much worse on it.  Had a breakdown Tuesday.  Has stopped all of her medications.  She has previously tried several SSRIs that included Lexapro, Zoloft, Celexa.  Unsure for treating the correct diagnosis.  She is willing to go to the therapist at this time.  She is also willing to start hydroxyzine to help with anxiety management until we can get her into therapy for proper assessment.  She denies having any SI or HI.  She denies having any chest pain, shortness of breath, headaches, dizziness or vision changes today while on the video visit  The patient does not have symptoms concerning for COVID-19 infection (fever, chills,  cough, or new shortness of breath).   Past Medical, Surgical, Social History, Allergies, and Medications have been Reviewed.  Past Medical History:  Diagnosis Date   Abnormal Pap smear of cervix 09/08/2019   08/28/19: ASCUS w/ +HRHPV   Per 2019 ASCCP guidelines, needs colposcopy as soon as possible.  Immediate risk of CIN3+ is 4.4%.    Anxiety    Headache    Missed abortion 03/15/2014   Nexplanon insertion 05/03/2016   Placed 05/03/16    05/09/19 removed   Pregnant 08/20/2015   Rubella non-immune status, antepartum 03/10/2014   MMR pp    Trichomonal vaginitis 04/29/2015   04/29/2015   POC: neg 07/2015 Repeat positive 10/08/15, POC neg 10/26/2015 04/04/16 + again.     UTI (lower urinary tract infection) 08/20/2015   Vaginal discharge 08/20/2015   Past Surgical History:  Procedure Laterality Date   DILATION AND CURETTAGE OF UTERUS       Current Meds  Medication Sig   acetaminophen (TYLENOL) 325 MG tablet Take 650 mg by mouth every 6 (six) hours as needed for mild pain, moderate pain or headache.    LO LOESTRIN FE 1 MG-10 MCG / 10 MCG tablet Take 1 tablet by mouth daily.   rizatriptan (MAXALT) 5 MG tablet Take 1 tablet (5 mg total) by mouth as needed for migraine. May repeat in 2 hours if needed     Allergies:   Hydrocodone   ROS:   Please see the history of present illness.  All other systems reviewed and are negative.   Labs/Other Tests and Data Reviewed:    Recent Labs: 09/25/2019: ALT 10; BUN 5; Creat 0.75; Hemoglobin 15.1; Platelets 207; Potassium 4.2; Sodium 138   Recent Lipid Panel No results found for: CHOL, TRIG, HDL, CHOLHDL, LDLCALC, LDLDIRECT  Wt Readings from Last 3 Encounters:  11/20/19 155 lb (70.3 kg)  11/06/19 155 lb 6.4 oz (70.5 kg)  10/31/19 152 lb (68.9 kg)     Objective:    Vital Signs:  BP 116/78    Ht '5\' 5"'  (1.651 m)    Wt 155 lb (70.3 kg)    BMI 25.79 kg/m    VITAL SIGNS:  reviewed GEN:  no acute distress EYES:  sclerae anicteric,  EOMI - Extraocular Movements Intact RESPIRATORY:  normal respiratory effort, symmetric expansion SKIN:  no rash, lesions or ulcers. MUSCULOSKELETAL:  no obvious deformities. NEURO:  alert and oriented x 3, no obvious focal deficit PSYCH:  normal affect  ASSESSMENT & PLAN:    1. Generalized anxiety disorder  - hydrOXYzine (ATARAX/VISTARIL) 10 MG tablet; Take 1 tablet (10 mg total) by mouth 3 (three) times daily as needed for anxiety.  Dispense: 90 tablet; Refill: 1 - Ambulatory referral to [place]  2. Adult ADHD  - Ambulatory referral to Behavioral Health    Time:   Today, I have spent 10 minutes with the patient with telehealth technology discussing the above problems.     Medication Adjustments/Labs and Tests Ordered: Current medicines are reviewed at length with the patient today.  Concerns regarding medicines are outlined above.   Tests Ordered: Orders Placed This Encounter  Procedures   Ambulatory referral to Behavioral Health    Medication Changes: Meds ordered this encounter  Medications   hydrOXYzine (ATARAX/VISTARIL) 10 MG tablet    Sig: Take 1 tablet (10 mg total) by mouth 3 (three) times daily as needed for anxiety.    Dispense:  90 tablet    Refill:  1    Order Specific Question:   Supervising Provider    Answer:   Tula Nakayama E [[id number]]    Disposition:  Follow up 4-6 weeks  Signed, Perlie Mayo, NP  11/20/2019 11:10 AM     [facility]

## 2019-11-20 NOTE — Patient Instructions (Signed)
I appreciate the opportunity to provide you with care for your health and wellness. Today we discussed: anxiety/ mood concerns  Follow up: 4-6 weeks, sooner if needed  No labs  Referrals today for therapist   New medication to help with anxiety. Please call if you need me sooner than upcoming appt.  Please continue to practice social distancing to keep you, your family, and our community safe.  If you must go out, please wear a mask and practice good handwashing.  It was a pleasure to see you and I look forward to continuing to work together on your health and well-being. Please do not hesitate to call the office if you need care or have questions about your care.  Have a wonderful day and week. With Gratitude, Tereasa Coop, DNP, AGNP-BC   Managing Anxiety, Adult After being diagnosed with an anxiety disorder, you may be relieved to know why you have felt or behaved a certain way. You may also feel overwhelmed about the treatment ahead and what it will mean for your life. With care and support, you can manage this condition and recover from it. How to manage lifestyle changes Managing stress and anxiety  Stress is your body's reaction to life changes and events, both good and bad. Most stress will last just a few hours, but stress can be ongoing and can lead to more than just stress. Although stress can play a major role in anxiety, it is not the same as anxiety. Stress is usually caused by something external, such as a deadline, test, or competition. Stress normally passes after the triggering event has ended.  Anxiety is caused by something internal, such as imagining a terrible outcome or worrying that something will go wrong that will devastate you. Anxiety often does not go away even after the triggering event is over, and it can become long-term (chronic) worry. It is important to understand the differences between stress and anxiety and to manage your stress effectively so that it  does not lead to an anxious response. Talk with your health care provider or a counselor to learn more about reducing anxiety and stress. He or she may suggest tension reduction techniques, such as:  Music therapy. This can include creating or listening to music that you enjoy and that inspires you.  Mindfulness-based meditation. This involves being aware of your normal breaths while not trying to control your breathing. It can be done while sitting or walking.  Centering prayer. This involves focusing on a word, phrase, or sacred image that means something to you and brings you peace.  Deep breathing. To do this, expand your stomach and inhale slowly through your nose. Hold your breath for 3-5 seconds. Then exhale slowly, letting your stomach muscles relax.  Self-talk. This involves identifying thought patterns that lead to anxiety reactions and changing those patterns.  Muscle relaxation. This involves tensing muscles and then relaxing them. Choose a tension reduction technique that suits your lifestyle and personality. These techniques take time and practice. Set aside 5-15 minutes a day to do them. Therapists can offer counseling and training in these techniques. The training to help with anxiety may be covered by some insurance plans. Other things you can do to manage stress and anxiety include:  Keeping a stress/anxiety diary. This can help you learn what triggers your reaction and then learn ways to manage your response.  Thinking about how you react to certain situations. You may not be able to control everything, but you can  control your response.  Making time for activities that help you relax and not feeling guilty about spending your time in this way.  Visual imagery and yoga can help you stay calm and relax.  Medicines Medicines can help ease symptoms. Medicines for anxiety include:  Anti-anxiety drugs.  Antidepressants. Medicines are often used as a primary treatment for  anxiety disorder. Medicines will be prescribed by a health care provider. When used together, medicines, psychotherapy, and tension reduction techniques may be the most effective treatment. Relationships Relationships can play a big part in helping you recover. Try to spend more time connecting with trusted friends and family members. Consider going to couples counseling, taking family education classes, or going to family therapy. Therapy can help you and others better understand your condition. How to recognize changes in your anxiety Everyone responds differently to treatment for anxiety. Recovery from anxiety happens when symptoms decrease and stop interfering with your daily activities at home or work. This may mean that you will start to:  Have better concentration and focus. Worry will interfere less in your daily thinking.  Sleep better.  Be less irritable.  Have more energy.  Have improved memory. It is important to recognize when your condition is getting worse. Contact your health care provider if your symptoms interfere with home or work and you feel like your condition is not improving. Follow these instructions at home: Activity  Exercise. Most adults should do the following: ? Exercise for at least 150 minutes each week. The exercise should increase your heart rate and make you sweat (moderate-intensity exercise). ? Strengthening exercises at least twice a week.  Get the right amount and quality of sleep. Most adults need 7-9 hours of sleep each night. Lifestyle   Eat a healthy diet that includes plenty of vegetables, fruits, whole grains, low-fat dairy products, and lean protein. Do not eat a lot of foods that are high in solid fats, added sugars, or salt.  Make choices that simplify your life.  Do not use any products that contain nicotine or tobacco, such as cigarettes, e-cigarettes, and chewing tobacco. If you need help quitting, ask your health care  provider.  Avoid caffeine, alcohol, and certain over-the-counter cold medicines. These may make you feel worse. Ask your pharmacist which medicines to avoid. General instructions  Take over-the-counter and prescription medicines only as told by your health care provider.  Keep all follow-up visits as told by your health care provider. This is important. Where to find support You can get help and support from these sources:  Self-help groups.  Online and [employer].  A trusted spiritual leader.  Couples counseling.  Family education classes.  Family therapy. Where to find more information You may find that joining a support group helps you deal with your anxiety. The following sources can help you locate counselors or support groups near you:  [place]: www.mentalhealthamerica.net  Anxiety and Depression Association of [place] (ADAA): https://www.clark.net/  National Alliance on Mental Illness (NAMI): www.nami.org Contact a health care provider if you:  Have a hard time staying focused or finishing daily tasks.  Spend many hours a day feeling worried about everyday life.  Become exhausted by worry.  Start to have headaches, feel tense, or have nausea.  Urinate more than normal.  Have diarrhea. Get help right away if you have:  A racing heart and shortness of breath.  Thoughts of hurting yourself or others. If you ever feel like you may hurt yourself or others,  or have thoughts about taking your own life, get help right away. You can go to your nearest emergency department or call:  Your local emergency services (911 in the [place]  A suicide crisis helpline, such as the National Suicide Prevention Lifeline at (636)883-1649. This is open 24 hours a day. Summary  Taking steps to learn and use tension reduction techniques can help calm you and help prevent triggering an anxiety reaction.  When used together, medicines, psychotherapy, and tension  reduction techniques may be the most effective treatment.  Family, friends, and partners can play a big part in helping you recover from an anxiety disorder. This information is not intended to replace advice given to you by your health care provider. Make sure you discuss any questions you have with your health care provider. Document Revised: 10/15/2018 Document Reviewed: 10/15/2018 Elsevier Patient Education  2020 [employer].

## 2019-12-19 ENCOUNTER — Other Ambulatory Visit: Payer: Self-pay

## 2019-12-19 ENCOUNTER — Telehealth: Payer: Medicaid Other | Admitting: Family Medicine

## 2019-12-23 ENCOUNTER — Other Ambulatory Visit: Payer: Self-pay

## 2019-12-23 ENCOUNTER — Encounter: Payer: Self-pay | Admitting: Family Medicine

## 2019-12-23 ENCOUNTER — Telehealth (INDEPENDENT_AMBULATORY_CARE_PROVIDER_SITE_OTHER): Payer: Medicaid Other | Admitting: Family Medicine

## 2019-12-23 VITALS — BP 116/78 | Ht 65.0 in | Wt 155.0 lb

## 2019-12-23 DIAGNOSIS — F411 Generalized anxiety disorder: Secondary | ICD-10-CM

## 2019-12-23 DIAGNOSIS — F321 Major depressive disorder, single episode, moderate: Secondary | ICD-10-CM | POA: Insufficient documentation

## 2019-12-23 MED ORDER — FLUOXETINE HCL 10 MG PO TABS: 10.0000 mg | ORAL_TABLET | Freq: Every day | ORAL | 2 refills | Status: DC

## 2019-12-23 NOTE — Progress Notes (Signed)
Virtual Visit via Telephone Note   This visit type was conducted due to national recommendations for restrictions regarding the COVID-19 Pandemic (e.g. social distancing) in an effort to limit this patient's exposure and mitigate transmission in our community.  Due to her co-morbid illnesses, this patient is at least at moderate risk for complications without adequate follow up.  This format is felt to be most appropriate for this patient at this time.  The patient did not have access to video technology/had technical difficulties with video requiring transitioning to audio format only (telephone).  All issues noted in this document were discussed and addressed.  No physical exam could be performed with this format.    Evaluation Performed:  Follow-up visit  Date:  12/23/2019   ID:  Ann Moore, DOB 06-22-1990, MRN [id number]  Patient Location: Home Provider Location: Office/Clinic  Location of Patient: Home Location of Provider: Telehealth Consent was obtain for visit to be over via telehealth. I verified that I am speaking with the correct person using two identifiers.  PCP:  Perlie Mayo, NP   Chief Complaint: Anxiety follow-up  History of Present Illness:    Ann Moore is a 29 y.o. female with with anxiety, depression and questionable ADHD.  She is here today regarding follow-up on medication.  I started her on Wellbutrin hoping that that would start helping with ADHD and anxiety however she stopped taking that at the end of June reporting that it was not helping her.  And that is making her feel much worse.  She has previously tried several SSRIs that included Lexapro, Zoloft, Celexa.  She is willing to try another one-Prozac.  She is willing to go to a therapist however the therapy that was sent in reports that they never got a hold of her she is alone to have this recent.  He reports that the hydroxyzine works some but then stopped working for her.  She reports that  she is tired and just does not feel overall well.  She denies having any SI or HI at this time.  The patient does not have symptoms concerning for COVID-19 infection (fever, chills, cough, or new shortness of breath).   Past Medical, Surgical, Social History, Allergies, and Medications have been Reviewed.  Past Medical History:  Diagnosis Date  . Abnormal Pap smear of cervix 09/08/2019   08/28/19: ASCUS w/ +HRHPV   Per 2019 ASCCP guidelines, needs colposcopy as soon as possible.  Immediate risk of CIN3+ is 4.4%.   Marland Kitchen Anxiety   . Headache   . Missed abortion 03/15/2014  . Nexplanon insertion 05/03/2016   Placed 05/03/16    05/09/19 removed  . Pregnant 08/20/2015  . Rubella non-immune status, antepartum 03/10/2014   MMR pp   . Trichomonal vaginitis 04/29/2015   04/29/2015   POC: neg 07/2015 Repeat positive 10/08/15, POC neg 10/26/2015 04/04/16 + again.    Marland Kitchen UTI (lower urinary tract infection) 08/20/2015  . Vaginal discharge 08/20/2015   Past Surgical History:  Procedure Laterality Date  . DILATION AND CURETTAGE OF UTERUS       Current Meds  Medication Sig  . acetaminophen (TYLENOL) 325 MG tablet Take 650 mg by mouth every 6 (six) hours as needed for mild pain, moderate pain or headache.   . LO LOESTRIN FE 1 MG-10 MCG / 10 MCG tablet Take 1 tablet by mouth daily.  . rizatriptan (MAXALT) 5 MG tablet Take 1 tablet (5 mg total) by mouth as  needed for migraine. May repeat in 2 hours if needed     Allergies:   Hydrocodone   ROS:   Please see the history of present illness.    All other systems reviewed and are negative.   Labs/Other Tests and Data Reviewed:    Recent Labs: 09/25/2019: ALT 10; BUN 5; Creat 0.75; Hemoglobin 15.1; Platelets 207; Potassium 4.2; Sodium 138   Recent Lipid Panel No results found for: CHOL, TRIG, HDL, CHOLHDL, LDLCALC, LDLDIRECT  Wt Readings from Last 3 Encounters:  12/23/19 155 lb (70.3 kg)  11/20/19 155 lb (70.3 kg)  11/06/19 155 lb 6.4 oz (70.5 kg)      Objective:    Vital Signs:  BP 116/78   Ht '5\' 5"'  (1.651 m)   Wt 155 lb (70.3 kg)   BMI 25.79 kg/m    VITAL SIGNS:  reviewed GEN:  alert and oriented  RESPIRATORY:  no shortness of breath in conversation  PSYCH:  flat, depressed mood   Depression screen [facility] 2/9 12/23/2019 11/20/2019 11/06/2019 09/25/2019 08/28/2019  Decreased Interest 2 3 0 0 0  Down, Depressed, Hopeless 0 3 0 0 0  PHQ - 2 Score 2 6 0 0 0  Altered sleeping '3 3 3 2 1  ' Tired, decreased energy '3 3 3 1 ' -  Change in appetite 3 0 0 0 0  Feeling bad or failure about yourself  1 0 0 0 0  Trouble concentrating '2 3 1 ' 0 0  Moving slowly or fidgety/restless 3 3 0 0 0  Suicidal thoughts 0 0 0 0 0  PHQ-9 Score '17 18 7 3 1  ' Difficult doing work/chores Very difficult Very difficult Somewhat difficult Not difficult at all Not difficult at all    GAD 7 : Generalized Anxiety Score 12/23/2019 11/20/2019 11/06/2019 09/25/2019  Nervous, Anxious, on Edge '2 3 1 2  ' Control/stop worrying '3 3 1 1  ' Worry too much - different things 3 3 0 1  Trouble relaxing '2 3 1 ' 0  Restless 3 3 0 3  Easily annoyed or irritable '2 3 3 1  ' Afraid - awful might happen 1 3 0 0  Total GAD 7 Score '16 21 6 8  ' Anxiety Difficulty Somewhat difficult Very difficult Somewhat difficult Not difficult at all      ASSESSMENT & PLAN:    1. Depression, major, single episode, moderate ([place])   2. Generalized anxiety disorder  - FLUoxetine (PROZAC) 10 MG tablet; Take 1 tablet (10 mg total) by mouth daily.  Dispense: 30 tablet; Refill: 2   Time:   Today, I have spent 10 minutes with the patient with telehealth technology discussing the above problems.     Medication Adjustments/Labs and Tests Ordered: Current medicines are reviewed at length with the patient today.  Concerns regarding medicines are outlined above.   Tests Ordered: No orders of the defined types were placed in this encounter.   Medication Changes: No orders of the defined types were placed in  this encounter.   Disposition:  Follow up 4-6 weeks Signed, Perlie Mayo, NP  12/23/2019 11:22 AM     [facility]

## 2019-12-23 NOTE — Assessment & Plan Note (Signed)
Having a little bit of a difficult time and trying to figure out which medication might work for her.  She has tried Lexapro, Zoloft, Celexa, Wellbutrin.  Is willing to try Prozac at this time.  BuSpar was not helpful.  Hydroxyzine helps some but then stopped helping.  She is willing to try that in conjunction with Prozac.  And another referral for better evaluation management.  Denies SI and HI at this time

## 2019-12-23 NOTE — Assessment & Plan Note (Signed)
Having a little bit of a difficult time and trying to figure out which medication might work for her.  She has tried Lexapro, Zoloft, Celexa, Wellbutrin.  Is willing to try Prozac at this time.  BuSpar was not helpful.  Hydroxyzine helps some but then stopped helping.  She is willing to try that in conjunction with Prozac.  And another referral for better evaluation management.  Denies SI and HI at this time 

## 2019-12-23 NOTE — Patient Instructions (Addendum)
I appreciate the opportunity to provide you with care for your health and wellness. Today we discussed: Ongoing anxiety and depression  Follow up: 5-6 weeks   No labs or referrals today  Referral will be adjusted to see if we can get you back into help make sure we are treating you for the right thing.  Please take Prozac as directed.  And hydroxyzine as directed.  Please continue to practice social distancing to keep you, your family, and our community safe.  If you must go out, please wear a mask and practice good handwashing.  It was a pleasure to see you and I look forward to continuing to work together on your health and well-being. Please do not hesitate to call the office if you need care or have questions about your care.  Have a wonderful day and week. With Gratitude, Tereasa Coop, DNP, AGNP-BC

## 2019-12-31 ENCOUNTER — Ambulatory Visit: Payer: Medicaid Other | Attending: Critical Care Medicine

## 2019-12-31 DIAGNOSIS — Z23 Encounter for immunization: Secondary | ICD-10-CM

## 2019-12-31 NOTE — Progress Notes (Signed)
   Covid-19 Vaccination Clinic  Name:  Ann Moore    MRN: [id number] DOB: Apr 14, 1991  12/31/2019  [name] was observed post Covid-19 immunization for 15 minutes without incident. She was provided with Vaccine Information Sheet and instruction to access the V-Safe system.   [name] was instructed to call 911 with any severe reactions post vaccine: Marland Kitchen Difficulty breathing  . Swelling of face and throat  . A fast heartbeat  . A bad rash all over body  . Dizziness and weakness   Immunizations Administered    Name Date Dose VIS Date Route   Pfizer COVID-19 Vaccine 12/31/2019  3:21 PM 0.3 mL 07/23/2018 Intramuscular   Manufacturer: [employer]   Lot: [id number]   NDC: [address]-1

## 2020-01-28 ENCOUNTER — Other Ambulatory Visit: Payer: Self-pay

## 2020-01-28 ENCOUNTER — Other Ambulatory Visit: Payer: Self-pay | Admitting: *Deleted

## 2020-01-28 ENCOUNTER — Encounter: Payer: Self-pay | Admitting: Family Medicine

## 2020-01-28 ENCOUNTER — Ambulatory Visit: Payer: Medicaid Other | Attending: Internal Medicine

## 2020-01-28 ENCOUNTER — Telehealth (INDEPENDENT_AMBULATORY_CARE_PROVIDER_SITE_OTHER): Payer: Medicaid Other | Admitting: Family Medicine

## 2020-01-28 VITALS — BP 116/78 | Ht 65.0 in | Wt 155.0 lb

## 2020-01-28 DIAGNOSIS — F321 Major depressive disorder, single episode, moderate: Secondary | ICD-10-CM

## 2020-01-28 DIAGNOSIS — F411 Generalized anxiety disorder: Secondary | ICD-10-CM

## 2020-01-28 DIAGNOSIS — Z23 Encounter for immunization: Secondary | ICD-10-CM

## 2020-01-28 MED ORDER — TRAZODONE HCL 50 MG PO TABS: 25.0000 mg | ORAL_TABLET | Freq: Every day | ORAL | 0 refills | Status: DC

## 2020-01-28 MED ORDER — ESCITALOPRAM OXALATE 10 MG PO TABS: 10.0000 mg | ORAL_TABLET | Freq: Every day | ORAL | 1 refills | Status: DC

## 2020-01-28 NOTE — Assessment & Plan Note (Addendum)
We have tried several medications. She has also tried a few medications in her past.  These include Lexapro, Zoloft, Celexa, Wellbutrin.  Most recently Prozac.  Reports that she just did not feel right on the Prozac it made her sick all the time on her stomach.  BuSpar was not very helpful for her.  However did have her on a lower dose.  Hydroxyzine was helping some but overall was not doing well to help her sleep.   She is willing to adjust medications again to see if they are helpful and again try to get into therapy.  She denies having any SI or HI at this time.  Very tearful in conversation.  Very open and willing to get help.  We will try her back on Lexapro.  And short-term trazodone to help with sleep.  Reviewed side effects, risks and benefits of medication.  Patient acknowledged agreement and understanding of the plan.

## 2020-01-28 NOTE — Assessment & Plan Note (Addendum)
We have tried several medications. She has also tried a few medications in her past.  These include Lexapro, Zoloft, Celexa, Wellbutrin.  Most recently Prozac.  Reports that she just did not feel right on the Prozac it made her sick all the time on her stomach.  BuSpar was not very helpful for her.  However did have her on a lower dose.  Hydroxyzine was helping some but overall was not doing well to help her sleep.   She is willing to adjust medications again to see if they are helpful and again try to get into therapy.  She denies having any SI or HI at this time.  Very tearful in conversation.  Very open and willing to get help. ° °We will try her back on Lexapro.  And short-term trazodone to help with sleep.  Reviewed side effects, risks and benefits of medication.  Patient acknowledged agreement and understanding of the plan.  ° °

## 2020-01-28 NOTE — Progress Notes (Signed)
   Covid-19 Vaccination Clinic  Name:  Ann Moore    MRN: [id number] DOB: 02-28-1991  01/28/2020  [name] was observed post Covid-19 immunization for 15 minutes without incident. She was provided with Vaccine Information Sheet and instruction to access the V-Safe system.   [name] was instructed to call 911 with any severe reactions post vaccine: Marland Kitchen Difficulty breathing  . Swelling of face and throat  . A fast heartbeat  . A bad rash all over body  . Dizziness and weakness   Immunizations Administered    Name Date Dose VIS Date Route   Pfizer COVID-19 Vaccine 01/28/2020  3:46 PM 0.3 mL 07/23/2018 Intramuscular   Manufacturer: [employer], [employer]   Lot: [id number]   NDC: [address]-4

## 2020-01-28 NOTE — Patient Instructions (Addendum)
I appreciate the opportunity to provide you with care for your health and wellness. Today we discussed: mood  Follow up: 3 weeks by phone   No labs  Referrals today: Therapy  Start writing, drawing, or coloring in a notebook to get your feelings out. Remember that emotions are like clouds, acknowledge them and then let them float by.  Medication changes today-call if you have questions    Please continue to practice social distancing to keep you, your family, and our community safe.  If you must go out, please wear a mask and practice good handwashing.  It was a pleasure to see you and I look forward to continuing to work together on your health and well-being. Please do not hesitate to call the office if you need care or have questions about your care.  Have a wonderful day and week. With Gratitude, Tereasa Coop, DNP, AGNP-BC

## 2020-01-28 NOTE — Progress Notes (Signed)
Virtual Visit via Telephone Note   This visit type was conducted due to national recommendations for restrictions regarding the COVID-19 Pandemic (e.g. social distancing) in an effort to limit this patient's exposure and mitigate transmission in our community.  Due to her co-morbid illnesses, this patient is at least at moderate risk for complications without adequate follow up.  This format is felt to be most appropriate for this patient at this time.  The patient did not have access to video technology/had technical difficulties with video requiring transitioning to audio format only (telephone).  All issues noted in this document were discussed and addressed.  No physical exam could be performed with this format.    Evaluation Performed:  Follow-up visit  Date:  01/28/2020   ID:  Stanford Breed, DOB Sep 03, 1990, MRN [id number]  Patient Location: Home Provider Location: Office/Clinic  Location of Patient: Home Location of Provider: Telehealth Consent was obtain for visit to be over via telehealth. I verified that I am speaking with the correct person using two identifiers.  PCP:  Perlie Mayo, NP   Chief Complaint:  Anxiety and depression  History of Present Illness:    Ann Moore is a 29 y.o. female with with history of anxiety and depression.  Additionally has been having trouble with sleeping.  Is a current everyday smoker.  We have tried several medications that she has tried several medications in her past.  These include Lexapro, Zoloft, Celexa, Wellbutrin.  Most recently Prozac.  Reports that she just did not feel right on the Prozac it made her sick all the time on her stomach.  BuSpar was not very helpful for her.  However did have her on a lower dose.  Hydroxyzine was helping some but overall was not doing well to help her sleep.  She most recently had a break-up from her child's father.  It is a better situation for her and her child however this is very disruptive  and stressful for her.  Her grandmother is also in the hospital and hopefully is going to be okay but she is very close with her and very concerned.  Her anxiety has been uncontrolled at this time secondary to the Xanax.  And the medications not working.  She is willing to adjust medications again to see if they are helpful and again try to get into therapy.  She denies having any SI or HI at this time.  Very tearful in conversation.  Very open and willing to get help.  The patient does not have symptoms concerning for COVID-19 infection (fever, chills, cough, or new shortness of breath).   Past Medical, Surgical, Social History, Allergies, and Medications have been Reviewed.  Past Medical History:  Diagnosis Date  . Abnormal Pap smear of cervix 09/08/2019   08/28/19: ASCUS w/ +HRHPV   Per 2019 ASCCP guidelines, needs colposcopy as soon as possible.  Immediate risk of CIN3+ is 4.4%.   Marland Kitchen Anxiety   . Headache   . Missed abortion 03/15/2014  . Nexplanon insertion 05/03/2016   Placed 05/03/16    05/09/19 removed  . Pregnant 08/20/2015  . Rubella non-immune status, antepartum 03/10/2014   MMR pp   . Trichomonal vaginitis 04/29/2015   04/29/2015   POC: neg 07/2015 Repeat positive 10/08/15, POC neg 10/26/2015 04/04/16 + again.    Marland Kitchen UTI (lower urinary tract infection) 08/20/2015  . Vaginal discharge 08/20/2015   Past Surgical History:  Procedure Laterality Date  . DILATION AND  CURETTAGE OF UTERUS       Current Meds  Medication Sig  . acetaminophen (TYLENOL) 325 MG tablet Take 650 mg by mouth every 6 (six) hours as needed for mild pain, moderate pain or headache.   . LO LOESTRIN FE 1 MG-10 MCG / 10 MCG tablet Take 1 tablet by mouth daily.  . rizatriptan (MAXALT) 5 MG tablet Take 1 tablet (5 mg total) by mouth as needed for migraine. May repeat in 2 hours if needed     Allergies:   Hydrocodone   ROS:   Please see the history of present illness.    All other systems reviewed and are  negative.   Labs/Other Tests and Data Reviewed:    Recent Labs: 09/25/2019: ALT 10; BUN 5; Creat 0.75; Hemoglobin 15.1; Platelets 207; Potassium 4.2; Sodium 138   Recent Lipid Panel No results found for: CHOL, TRIG, HDL, CHOLHDL, LDLCALC, LDLDIRECT  Wt Readings from Last 3 Encounters:  01/28/20 155 lb (70.3 kg)  12/23/19 155 lb (70.3 kg)  11/20/19 155 lb (70.3 kg)     Objective:    Vital Signs:  BP 116/78   Ht '5\' 5"'  (1.651 m)   Wt 155 lb (70.3 kg)   BMI 25.79 kg/m    VITAL SIGNS:  reviewed GEN:  alert and oriented RESPIRATORY:  no shortness of breath in conversation  PSYCH:  tearful and anxious    Depression screen [facility] 2/9 01/28/2020 12/23/2019 11/20/2019 11/06/2019 09/25/2019  Decreased Interest '1 2 3 ' 0 0  Down, Depressed, Hopeless 2 0 3 0 0  PHQ - 2 Score '3 2 6 ' 0 0  Altered sleeping '2 3 3 3 2  ' Tired, decreased energy '3 3 3 3 1  ' Change in appetite 1 3 0 0 0  Feeling bad or failure about yourself  1 1 0 0 0  Trouble concentrating '3 2 3 1 ' 0  Moving slowly or fidgety/restless '3 3 3 ' 0 0  Suicidal thoughts 0 0 0 0 0  PHQ-9 Score '16 17 18 7 3  ' Difficult doing work/chores Very difficult Very difficult Very difficult Somewhat difficult Not difficult at all    GAD 7 : Generalized Anxiety Score 01/28/2020 12/23/2019 11/20/2019 11/06/2019  Nervous, Anxious, on Edge '3 2 3 1  ' Control/stop worrying '3 3 3 1  ' Worry too much - different things '3 3 3 ' 0  Trouble relaxing '3 2 3 1  ' Restless '3 3 3 ' 0  Easily annoyed or irritable '3 2 3 3  ' Afraid - awful might happen '3 1 3 ' 0  Total GAD 7 Score '21 16 21 6  ' Anxiety Difficulty Very difficult Somewhat difficult Very difficult Somewhat difficult      ASSESSMENT & PLAN:    1. Depression, major, single episode, moderate (HCC)  - escitalopram (LEXAPRO) 10 MG tablet; Take 1 tablet (10 mg total) by mouth daily.  Dispense: 30 tablet; Refill: 1 - traZODone (DESYREL) 50 MG tablet; Take 0.5-1 tablets (25-50 mg total) by mouth at bedtime.  Dispense: 30  tablet; Refill: 0  2. Generalized anxiety disorder - escitalopram (LEXAPRO) 10 MG tablet; Take 1 tablet (10 mg total) by mouth daily.  Dispense: 30 tablet; Refill: 1 - traZODone (DESYREL) 50 MG tablet; Take 0.5-1 tablets (25-50 mg total) by mouth at bedtime.  Dispense: 30 tablet; Refill: 0   Time:   Today, I have spent 15 minutes with the patient with telehealth technology discussing the above problems.     Medication Adjustments/Labs and Tests  Ordered: Current medicines are reviewed at length with the patient today.  Concerns regarding medicines are outlined above.   Tests Ordered: No orders of the defined types were placed in this encounter.   Medication Changes: Meds ordered this encounter  Medications  . escitalopram (LEXAPRO) 10 MG tablet    Sig: Take 1 tablet (10 mg total) by mouth daily.    Dispense:  30 tablet    Refill:  1    Order Specific Question:   Supervising Provider    Answer:   SIMPSON, MARGARET E [[id number]]  . traZODone (DESYREL) 50 MG tablet    Sig: Take 0.5-1 tablets (25-50 mg total) by mouth at bedtime.    Dispense:  30 tablet    Refill:  0    Order Specific Question:   Supervising Provider    Answer:   Fayrene Helper [[id number]]    Disposition:  Follow up 3 weeks Signed, Perlie Mayo, NP  01/28/2020 4:59 PM     [facility]

## 2020-01-29 ENCOUNTER — Other Ambulatory Visit: Payer: Self-pay | Admitting: *Deleted

## 2020-02-03 ENCOUNTER — Other Ambulatory Visit: Payer: Self-pay | Admitting: *Deleted

## 2020-02-03 ENCOUNTER — Encounter: Payer: Self-pay | Admitting: Family Medicine

## 2020-02-03 ENCOUNTER — Telehealth (INDEPENDENT_AMBULATORY_CARE_PROVIDER_SITE_OTHER): Payer: Medicaid Other | Admitting: Family Medicine

## 2020-02-03 ENCOUNTER — Other Ambulatory Visit: Payer: Self-pay

## 2020-02-03 VITALS — BP 116/78 | Ht 65.0 in | Wt 155.0 lb

## 2020-02-03 DIAGNOSIS — F321 Major depressive disorder, single episode, moderate: Secondary | ICD-10-CM

## 2020-02-03 DIAGNOSIS — F411 Generalized anxiety disorder: Secondary | ICD-10-CM | POA: Diagnosis not present

## 2020-02-03 NOTE — Assessment & Plan Note (Signed)
GAD much improved to 4 from 21 last week.  Reports Lexapro is being tolerated and overall she feels much better.  Unfortunately short-term use of trazodone at low-dose does not seem to be helpful will increase the dose to see if that gives her better sleep.  However she might need to be on other medications that could help her with rest.  Psychiatry would hopefully be able to get this situated for if needed.  She denies having any SI or HI at this time.  Much more calmer and happier in conversation.

## 2020-02-03 NOTE — Progress Notes (Signed)
Virtual Visit via Telephone Note   This visit type was conducted due to national recommendations for restrictions regarding the COVID-19 Pandemic (e.g. social distancing) in an effort to limit this patient's exposure and mitigate transmission in our community.  Due to her co-morbid illnesses, this patient is at least at moderate risk for complications without adequate follow up.  This format is felt to be most appropriate for this patient at this time.  The patient did not have access to video technology/had technical difficulties with video requiring transitioning to audio format only (telephone).  All issues noted in this document were discussed and addressed.  No physical exam could be performed with this format.    Evaluation Performed:  Follow-up visit  Date:  02/03/2020   ID:  Stanford Breed, DOB 03-13-1991, MRN [id number]  Patient Location: Home Provider Location: Office/Clinic  Location of Patient: Home Location of Provider: Telehealth Consent was obtain for visit to be over via telehealth. I verified that I am speaking with the correct person using two identifiers.  PCP:  Perlie Mayo, NP   Chief Complaint: Mood follow-up  History of Present Illness:    Ann Moore is a 29 y.o. female with history of anxiety and depression.  Additionally is still having trouble sleeping.  Is a current everyday smoker.  She has tried several medications in the past.  She was willing to go back on Lexapro on September 1 when I spoke to her she was extremely tearful with elevated PHQ and GAD.  Anxiety was not well controlled.  She reports today that she thinks the Lexapro is starting to help.  She is feeling calmer.  She reports the low-dose trazodone helped for a few nights but by the third night she was not able to sleep again.  Hydroxyzine did help some but again and stopped after a few nights of using it.  She did not find BuSpar very helpful for her.  Stress level seems to be tapering  off some.  She is waiting for [employer] to get back a hold of her to help set up for therapy and possible psychiatry needs.  The patient does not have symptoms concerning for COVID-19 infection (fever, chills, cough, or new shortness of breath).   Past Medical, Surgical, Social History, Allergies, and Medications have been Reviewed.  Past Medical History:  Diagnosis Date  . Abnormal Pap smear of cervix 09/08/2019   08/28/19: ASCUS w/ +HRHPV   Per 2019 ASCCP guidelines, needs colposcopy as soon as possible.  Immediate risk of CIN3+ is 4.4%.   Marland Kitchen Anxiety   . Headache   . Missed abortion 03/15/2014  . Nexplanon insertion 05/03/2016   Placed 05/03/16    05/09/19 removed  . Pregnant 08/20/2015  . Rubella non-immune status, antepartum 03/10/2014   MMR pp   . Trichomonal vaginitis 04/29/2015   04/29/2015   POC: neg 07/2015 Repeat positive 10/08/15, POC neg 10/26/2015 04/04/16 + again.    Marland Kitchen UTI (lower urinary tract infection) 08/20/2015  . Vaginal discharge 08/20/2015   Past Surgical History:  Procedure Laterality Date  . DILATION AND CURETTAGE OF UTERUS       Current Meds  Medication Sig  . acetaminophen (TYLENOL) 325 MG tablet Take 650 mg by mouth every 6 (six) hours as needed for mild pain, moderate pain or headache.   . escitalopram (LEXAPRO) 10 MG tablet Take 1 tablet (10 mg total) by mouth daily.  . LO LOESTRIN FE 1 MG-10 MCG /  10 MCG tablet Take 1 tablet by mouth daily.  . rizatriptan (MAXALT) 5 MG tablet Take 1 tablet (5 mg total) by mouth as needed for migraine. May repeat in 2 hours if needed  . traZODone (DESYREL) 50 MG tablet Take 0.5-1 tablets (25-50 mg total) by mouth at bedtime.     Allergies:   Hydrocodone   ROS:   Please see the history of present illness.    All other systems reviewed and are negative.   Labs/Other Tests and Data Reviewed:    Recent Labs: 09/25/2019: ALT 10; BUN 5; Creat 0.75; Hemoglobin 15.1; Platelets 207; Potassium 4.2; Sodium 138   Recent Lipid  Panel No results found for: CHOL, TRIG, HDL, CHOLHDL, LDLCALC, LDLDIRECT  Wt Readings from Last 3 Encounters:  02/03/20 155 lb (70.3 kg)  01/28/20 155 lb (70.3 kg)  12/23/19 155 lb (70.3 kg)     Objective:    Vital Signs:  BP 116/78   Ht _0  (1.651 m)   Wt 155 lb (70.3 kg)   BMI 25.79 kg/m    VITAL SIGNS:  reviewed GEN:  Alert and oriented RESPIRATORY:  No shortness of breath noted in conversation PSYCH:  Pleasant affect, good communication, normal mood  Depression screen [facility] 2/9 02/03/2020 01/28/2020 12/23/2019 11/20/2019 11/06/2019  Decreased Interest _1 0  Down, Depressed, Hopeless 0 2 0 3 0  PHQ - 2 Score _2 0  Altered sleeping _3 Tired, decreased energy _4 Change in appetite 0 1 3 0 0  Feeling bad or failure about yourself  0 1 1 0 0  Trouble concentrating 0 _5 Moving slowly or fidgety/restless _6 0  Suicidal thoughts 0 0 0 0 0  PHQ-9 Score _7 Difficult doing work/chores Somewhat difficult Very difficult Very difficult Very difficult Somewhat difficult    GAD 7 : Generalized Anxiety Score 02/03/2020 01/28/2020 12/23/2019 11/20/2019  Nervous, Anxious, on Edge 0 _8 Control/stop worrying _9 Worry too much - different things _10 Trouble relaxing _11 Restless _12 Easily annoyed or irritable 0 _13 Afraid - awful might happen 0 _14 Total GAD 7 Score _15 Anxiety Difficulty Somewhat difficult Very difficult Somewhat difficult Very difficult     ASSESSMENT & PLAN:    1. Depression, major, single episode, moderate ([place])   2. Generalized anxiety disorder  Time:   Today, I have spent 10 minutes with the patient with telehealth technology discussing the above problems.     Medication Adjustments/Labs and Tests Ordered: Current medicines are reviewed at length with the patient today.  Concerns regarding medicines are outlined above.   Tests Ordered: No orders of the defined types were  placed in this encounter.   Medication Changes: No orders of the defined types were placed in this encounter.   Disposition:  Follow up 2 weeks Signed, Perlie Mayo, NP  02/03/2020 11:38 AM     [place]

## 2020-02-03 NOTE — Assessment & Plan Note (Signed)
PHQ 9-improved.  Denies having any SI or HI.  She is very pleasant and hopeful in communication.  Waiting on [employer] to call her back.  Reports taking her Lexapro as directed.  Reports trazodone did not help too much with sleep.  Outside the first 2 days.  We will try increased dose of this.  With close follow-up in 2 weeks.  Possible need for psychiatry help with medications in near future.

## 2020-02-03 NOTE — Patient Instructions (Addendum)
I appreciate the opportunity to provide you with care for your health and wellness. Today we discussed: Anxiety and depression  Follow up: 2 weeks   No labs or referrals today  So glad you are starting to go over better.  I do think continues.  Continue taking the medications as we discussed.  We will follow-up in 2 weeks to see how you are doing.  Pending before then please not hesitate to reach out.  Please continue to practice social distancing to keep you, your family, and our community safe.  If you must go out, please wear a mask and practice good handwashing.  It was a pleasure to see you and I look forward to continuing to work together on your health and well-being. Please do not hesitate to call the office if you need care or have questions about your care.  Have a wonderful day and week. With Gratitude, Tereasa Coop, DNP, AGNP-BC

## 2020-04-01 ENCOUNTER — Encounter: Payer: Self-pay | Admitting: Women's Health

## 2020-04-01 ENCOUNTER — Other Ambulatory Visit (HOSPITAL_COMMUNITY)
Admission: RE | Admit: 2020-04-01 | Discharge: 2020-04-01 | Disposition: A | Discharge status: Home / Self Care | Payer: Medicaid Other | Source: Ambulatory Visit | Attending: Obstetrics & Gynecology | Admitting: Obstetrics & Gynecology

## 2020-04-01 ENCOUNTER — Ambulatory Visit (INDEPENDENT_AMBULATORY_CARE_PROVIDER_SITE_OTHER): Payer: Medicaid Other | Admitting: Women's Health

## 2020-04-01 VITALS — BP 115/74 | HR 91 | Ht 64.0 in | Wt 147.4 lb

## 2020-04-01 DIAGNOSIS — Z3009 Encounter for other general counseling and advice on contraception: Secondary | ICD-10-CM

## 2020-04-01 DIAGNOSIS — N926 Irregular menstruation, unspecified: Secondary | ICD-10-CM | POA: Diagnosis not present

## 2020-04-01 NOTE — Patient Instructions (Signed)
NO SEX UNTIL AFTER YOU GET YOUR BIRTH CONTROL  

## 2020-04-01 NOTE — Progress Notes (Signed)
   GYN VISIT Patient name: Ann Moore MRN [id number]  Date of birth: 07/18/1990 Chief Complaint:   Metrorrhagia  History of Present Illness:   Ann Moore is a 29 y.o. [phone] Caucasian female being seen today for irregular bleeding. Had normal period 10/18, then skipped some pills and had some bleeding again, but has taking pills normally since, and woke up yesterday and has been spotting since. Denies abnormal discharge, itching/odor/irritation.  Is interested in getting IUD. Last sex 11/2.  Depression screen [facility] 2/9 02/03/2020 01/28/2020 12/23/2019 11/20/2019 11/06/2019  Decreased Interest 1 1 2 3  0  Down, Depressed, Hopeless 0 2 0 3 0  PHQ - 2 Score 1 3 2 6  0  Altered sleeping 1 2 3 3 3   Tired, decreased energy 1 3 3 3 3   Change in appetite 0 1 3 0 0  Feeling bad or failure about yourself  0 1 1 0 0  Trouble concentrating 0 3 2 3 1   Moving slowly or fidgety/restless 1 3 3 3  0  Suicidal thoughts 0 0 0 0 0  PHQ-9 Score 4 16 17 18 7   Difficult doing work/chores Somewhat difficult Very difficult Very difficult Very difficult Somewhat difficult    Patient's last menstrual period was 03/15/2020 (exact date). The current method of family planning is OCP (estrogen/progesterone).  Last pap 08/28/19. Results were:  abnormal  ASCUS w/ +HRHPV Review of Systems:   Pertinent items are noted in HPI Denies fever/chills, dizziness, headaches, visual disturbances, fatigue, shortness of breath, chest pain, abdominal pain, vomiting, abnormal vaginal discharge/itching/odor/irritation, problems with periods, bowel movements, urination, or intercourse unless otherwise stated above.  Pertinent History Reviewed:  Reviewed past medical,surgical, social, obstetrical and family history.  Reviewed problem list, medications and allergies. Physical Assessment:   Vitals:   04/01/20 1050  BP: 115/74  Pulse: 91  Weight: 147 lb 6.4 oz (66.9 kg)  Height: 5\' 4"  (1.626 m)  Body mass index is 25.3 kg/m.        Physical Examination:   General appearance: alert, well appearing, and in no distress  Mental status: alert, oriented to person, place, and time  Skin: warm & dry   Cardiovascular: normal heart rate noted  Respiratory: normal respiratory effort, no distress  Abdomen: soft, non-tender   Pelvic: VULVA: normal appearing vulva with no masses, tenderness or lesions, VAGINA: normal appearing vagina with normal color and discharge, no lesions, small amt dark brown blood CERVIX: normal appearing cervix without discharge or lesions  Extremities: no edema   Chaperone: Angel Neas    No results found for this or any previous visit (from the past 24 hour(s)).  Assessment & Plan:  1) Irregular bleeding> CV swab sent  2) Contraception counseling> interested in IUD, discussed types, wants Liletta, will scheduled for 11/16, no sex  Meds: No orders of the defined types were placed in this encounter.   No orders of the defined types were placed in this encounter.   Return for 11/16 or after for IUD insertion.  CNM, [facility] 04/01/2020 11:17 AM

## 2020-04-05 ENCOUNTER — Other Ambulatory Visit: Payer: Self-pay | Admitting: Women's Health

## 2020-04-05 ENCOUNTER — Telehealth: Payer: Self-pay | Admitting: Women's Health

## 2020-04-05 DIAGNOSIS — Z8619 Personal history of other infectious and parasitic diseases: Secondary | ICD-10-CM

## 2020-04-05 DIAGNOSIS — A749 Chlamydial infection, unspecified: Secondary | ICD-10-CM | POA: Insufficient documentation

## 2020-04-05 HISTORY — DX: Personal history of other infectious and parasitic diseases: Z86.19

## 2020-04-05 LAB — CERVICOVAGINAL ANCILLARY ONLY
Bacterial Vaginitis (gardnerella): NEGATIVE
Candida Glabrata: NEGATIVE
Candida Vaginitis: NEGATIVE
Chlamydia: POSITIVE — AB
Comment: NEGATIVE
Comment: NEGATIVE
Comment: NEGATIVE
Comment: NEGATIVE
Comment: NEGATIVE
Comment: NORMAL
Neisseria Gonorrhea: NEGATIVE
Trichomonas: NEGATIVE

## 2020-04-05 MED ORDER — AZITHROMYCIN 500 MG PO TABS: 1000.0000 mg | ORAL_TABLET | Freq: Once | ORAL | 0 refills | Status: AC

## 2020-04-05 NOTE — Telephone Encounter (Signed)
Pt called, she saw her lab results in MyChart & would like Selena Batten to go ahead & call in meds  [facility]  Please advise & notify pt

## 2020-04-10 ENCOUNTER — Ambulatory Visit
Admission: EM | Admit: 2020-04-10 | Discharge: 2020-04-10 | Disposition: A | Discharge status: Home / Self Care | Payer: Medicaid Other | Attending: Emergency Medicine | Admitting: Emergency Medicine

## 2020-04-10 ENCOUNTER — Other Ambulatory Visit: Payer: Self-pay

## 2020-04-10 DIAGNOSIS — J029 Acute pharyngitis, unspecified: Secondary | ICD-10-CM | POA: Diagnosis not present

## 2020-04-10 DIAGNOSIS — Z1152 Encounter for screening for COVID-19: Secondary | ICD-10-CM | POA: Diagnosis not present

## 2020-04-10 LAB — POCT RAPID STREP A (OFFICE): Rapid Strep A Screen: NEGATIVE

## 2020-04-10 MED ORDER — BENZONATATE 100 MG PO CAPS: 100.0000 mg | ORAL_CAPSULE | Freq: Three times a day (TID) | ORAL | 0 refills | Status: DC

## 2020-04-10 MED ORDER — AMOXICILLIN 500 MG PO CAPS: 500.0000 mg | ORAL_CAPSULE | Freq: Two times a day (BID) | ORAL | 0 refills | Status: AC

## 2020-04-10 NOTE — Discharge Instructions (Signed)
COVID testing ordered.  It will take between 5-7 days for test results.  Someone will contact you regarding abnormal results.    In the meantime: You should remain isolated in your home for 10 days from symptom onset AND greater than 72 hours after symptoms resolution (absence of fever without the use of fever-reducing medication and improvement in respiratory symptoms), whichever is longer Get plenty of rest and push fluids Tessalon Perles prescribed as needed for cough Use OTC zyrtec for nasal congestion, runny nose, and/or sore throat Use OTC flonase for nasal congestion and runny nose Use medications daily for symptom relief Use OTC medications like ibuprofen or tylenol as needed fever or pain Call or go to the ED if you have any new or worsening symptoms such as fever, cough, shortness of breath, chest tightness, chest pain, turning blue, changes in mental status, etc..Marland Kitchen

## 2020-04-10 NOTE — ED Provider Notes (Signed)
[place]   [id number] 04/10/20 Arrival Time: 5465   CC: Sore throat  SUBJECTIVE: History from: patient.  Ann Moore is a 29 y.o. female who presents with sore throat, body aches, and chills x this morning.  Denies sick exposure to COVID, flu or strep.   Has NOT tried OTC medications.  Symptoms are made worse with swallowing, but tolerating own secretions and liquids without difficulty.  Denies previous symptoms in the past.   Denies fever, cough, SOB, wheezing, chest pain, nausea, changes in bowel or bladder habits.    ROS: As per HPI.  All other pertinent ROS negative.     Past Medical History:  Diagnosis Date  . Abnormal Pap smear of cervix 09/08/2019   08/28/19: ASCUS w/ +HRHPV   Per 2019 ASCCP guidelines, needs colposcopy as soon as possible.  Immediate risk of CIN3+ is 4.4%.   Marland Kitchen Anxiety   . Headache   . Missed abortion 03/15/2014  . Nexplanon insertion 05/03/2016   Placed 05/03/16    05/09/19 removed  . Pregnant 08/20/2015  . Rubella non-immune status, antepartum 03/10/2014   MMR pp   . Trichomonal vaginitis 04/29/2015   04/29/2015   POC: neg 07/2015 Repeat positive 10/08/15, POC neg 10/26/2015 04/04/16 + again.    Marland Kitchen UTI (lower urinary tract infection) 08/20/2015  . Vaginal discharge 08/20/2015   Past Surgical History:  Procedure Laterality Date  . DILATION AND CURETTAGE OF UTERUS     Allergies  Allergen Reactions  . Hydrocodone Nausea And Vomiting and Rash   No current facility-administered medications on file prior to encounter.   Current Outpatient Medications on File Prior to Encounter  Medication Sig Dispense Refill  . acetaminophen (TYLENOL) 325 MG tablet Take 650 mg by mouth every 6 (six) hours as needed for mild pain, moderate pain or headache.     . escitalopram (LEXAPRO) 10 MG tablet Take 1 tablet (10 mg total) by mouth daily. (Patient not taking: Reported on 04/01/2020) 30 tablet 1  . LO LOESTRIN FE 1 MG-10 MCG / 10 MCG tablet Take 1 tablet by  mouth daily. 3 Package 3  . rizatriptan (MAXALT) 5 MG tablet Take 1 tablet (5 mg total) by mouth as needed for migraine. May repeat in 2 hours if needed 10 tablet 0   Social History   Socioeconomic History  . Marital status: Single    Spouse name: Not on file  . Number of children: 2  . Years of education: Not on file  . Highest education level: Some college, no degree  Occupational History  . Not on file  Tobacco Use  . Smoking status: Current Every Day Smoker    Packs/day: 0.50    Years: 7.00    Pack years: 3.50    Types: Cigarettes  . Smokeless tobacco: Never Used  Vaping Use  . Vaping Use: Never used  Substance and Sexual Activity  . Alcohol use: No    Alcohol/week: 0.0 standard drinks  . Drug use: No  . Sexual activity: Yes    Birth control/protection: Pill  Other Topics Concern  . Not on file  Social History Narrative   Lives with her 3 year son-Axon-father is in an out of photo   104 year old Rodman Key who lives with father [place]       Enjoys: outside, cleaning-likes to organize      Diet: eats all food groups-doesn't eat steak or lobster   Caffeine: 2-3 cans of soda, and ice coffee daily  Water: 6-8 cups daily      Wears seat belt   Does not use phone while driving   Oceanographer at home   Social Determinants of Health   Financial Resource Strain: Low Risk   . Difficulty of Paying Living Expenses: Not hard at all  Food Insecurity: No Food Insecurity  . Worried About Charity fundraiser in the Last Year: Never true  . Ran Out of Food in the Last Year: Never true  Transportation Needs: No Transportation Needs  . Lack of Transportation (Medical): No  . Lack of Transportation (Non-Medical): No  Physical Activity: Inactive  . Days of Exercise per Week: 0 days  . Minutes of Exercise per Session: 0 min  Stress: No Stress Concern Present  . Feeling of Stress : Only a little  Social Connections: Socially Isolated  . Frequency of Communication with Friends and  Family: More than three times a week  . Frequency of Social Gatherings with Friends and Family: More than three times a week  . Attends Religious Services: Never  . Active Member of Clubs or Organizations: No  . Attends Archivist Meetings: Never  . Marital Status: Never married  Intimate Partner Violence: Not At Risk  . Fear of Current or Ex-Partner: No  . Emotionally Abused: No  . Physically Abused: No  . Sexually Abused: No   Family History  Problem Relation Age of Onset  . Hypertension Mother   . Diabetes Sister   . Diabetes Maternal Grandfather   . Cancer Maternal Grandfather        liver, lung    OBJECTIVE:  Vitals:   04/10/20 1519  BP: 130/89  Pulse: (!) 102  Resp: 20  Temp: 98.3 F (36.8 C)  SpO2: 96%     General appearance: alert; appears fatigued, but nontoxic; speaking in full sentences and tolerating own secretions HEENT: NCAT; Ears: EACs clear, TMs pearly gray; Eyes: PERRL.  EOM grossly intact. Nose: nares patent without rhinorrhea, Throat: oropharynx clear, tonsils erythematous and enlarged with white exudates, uvula midline  Neck: supple without LAD Lungs: unlabored respirations, symmetrical air entry; cough: absent; no respiratory distress; CTAB Heart: regular rate and rhythm.   Skin: warm and dry Psychological: alert and cooperative; normal mood and affect  LABS:  Results for orders placed or performed during the hospital encounter of 04/10/20 (from the past 24 hour(s))  POCT rapid strep A     Status: None   Collection Time: 04/10/20  3:37 PM  Result Value Ref Range   Rapid Strep A Screen Negative Negative     ASSESSMENT & PLAN:  1. Encounter for screening for COVID-19   2. Sore throat     Meds ordered this encounter  Medications  . benzonatate (TESSALON) 100 MG capsule    Sig: Take 1 capsule (100 mg total) by mouth every 8 (eight) hours.    Dispense:  21 capsule    Refill:  0    Order Specific Question:   Supervising Provider      Answer:   Raylene Everts [[id number]]  . amoxicillin (AMOXIL) 500 MG capsule    Sig: Take 1 capsule (500 mg total) by mouth 2 (two) times daily for 10 days.    Dispense:  20 capsule    Refill:  0    Order Specific Question:   Supervising Provider    Answer:   Raylene Everts [[id number]]   COVID testing ordered.  It will take between 5-7  days for test results.  Someone will contact you regarding abnormal results.    In the meantime: You should remain isolated in your home for 10 days from symptom onset AND greater than 72 hours after symptoms resolution (absence of fever without the use of fever-reducing medication and improvement in respiratory symptoms), whichever is longer Get plenty of rest and push fluids Tessalon Perles prescribed as needed for cough Use OTC zyrtec for nasal congestion, runny nose, and/or sore throat Use OTC flonase for nasal congestion and runny nose Use medications daily for symptom relief Use OTC medications like ibuprofen or tylenol as needed fever or pain Call or go to the ED if you have any new or worsening symptoms such as fever, cough, shortness of breath, chest tightness, chest pain, turning blue, changes in mental status, etc...   Amoxicillin prescribed for possible strep throat  Reviewed expectations re: course of current medical issues. Questions answered. Outlined signs and symptoms indicating need for more acute intervention. Patient verbalized understanding. After Visit Summary given.         Lestine Box, PA-C 04/10/20 1547

## 2020-04-10 NOTE — ED Triage Notes (Signed)
Pt presents with c/o sore throat , body aches and chills that began this morning

## 2020-04-13 ENCOUNTER — Other Ambulatory Visit: Payer: Self-pay | Admitting: Women's Health

## 2020-04-13 DIAGNOSIS — A749 Chlamydial infection, unspecified: Secondary | ICD-10-CM

## 2020-04-13 LAB — CULTURE, GROUP A STREP (THRC)

## 2020-04-13 LAB — COVID-19, FLU A+B AND RSV
Influenza A, NAA: NOT DETECTED
Influenza B, NAA: NOT DETECTED
RSV, NAA: NOT DETECTED
SARS-CoV-2, NAA: NOT DETECTED

## 2020-04-13 MED ORDER — PROMETHAZINE HCL 25 MG PO TABS: 25.0000 mg | ORAL_TABLET | Freq: Four times a day (QID) | ORAL | 0 refills | Status: DC | PRN

## 2020-04-13 MED ORDER — AZITHROMYCIN 500 MG PO TABS: 1000.0000 mg | ORAL_TABLET | Freq: Once | ORAL | 0 refills | Status: AC

## 2020-04-19 ENCOUNTER — Ambulatory Visit: Payer: Medicaid Other | Admitting: Women's Health

## 2020-04-26 ENCOUNTER — Ambulatory Visit: Payer: Medicaid Other

## 2020-05-04 ENCOUNTER — Ambulatory Visit: Payer: Medicaid Other | Admitting: Women's Health

## 2020-05-29 NOTE — L&D Delivery Note (Signed)
OB/GYN Faculty Practice Delivery Note  Ann Moore is a 30 y.o. [id number] s/p SVD at [redacted]w[redacted]d. She was admitted for IOL for gHTN.   ROM: 1h 31m with clear fluid GBS Status: Negative Maximum Maternal Temperature: 98.9  Labor Progress: Patient presented for IOL, received cytotec and had a cooks balloon placed and once cooks was out was started on cytotec and progressed to complete.   Delivery Date/Time: 7654 on 11/15 Delivery: Called to room and patient was complete and pushing. Head delivered OA. Loose nuchal cord present and delivered through. Shoulder and body delivered in usual fashion. Infant with spontaneous cry, placed on mother's abdomen, dried and stimulated. Cord clamped x 2 after 1-minute delay, and cut by father of baby. Cord blood drawn. Placenta delivered spontaneously with gentle cord traction. Fundus firm with massage and Pitocin. Labia, perineum, vagina, and cervix inspected and found to have few hemostatic abrasions that did not require  sutures.   Of note, patient's right leg with  67mmx4mm pedunculated lump. Patient had well functioning epidural and per patient request was removed in the following fashion: a fifteen blade scalpel was used to make an elliptical incision and the lump was removed. Two interrupted sutures placed using 0 prolene. The incision had excellent hemostasis and was bandaged with guaze and tegederm. A message was sent to [facility] to schedule a wound check and suture removal in 1 week.  Placenta: intact, 3V cord, to L&D Complications: None Lacerations: None EBL: 50cc Analgesia: epidural   Infant: female  APGARs 55,9  3000g  Warner Mccreedy, MD, MPH OB Fellow, Faculty Practice Center for [facility], [facility] 04/12/2021, 12:57 PM

## 2020-06-02 ENCOUNTER — Other Ambulatory Visit: Payer: Self-pay

## 2020-06-02 ENCOUNTER — Emergency Department (HOSPITAL_COMMUNITY)
Admission: EM | Admit: 2020-06-02 | Discharge: 2020-06-02 | Discharge status: Against Medical Advice | Payer: BLUE CROSS/BLUE SHIELD

## 2020-07-08 ENCOUNTER — Other Ambulatory Visit: Payer: Self-pay

## 2020-07-08 ENCOUNTER — Ambulatory Visit
Admission: EM | Admit: 2020-07-08 | Discharge: 2020-07-08 | Disposition: A | Discharge status: Home / Self Care | Payer: Medicaid Other | Attending: Family Medicine | Admitting: Family Medicine

## 2020-07-08 DIAGNOSIS — J029 Acute pharyngitis, unspecified: Secondary | ICD-10-CM | POA: Diagnosis not present

## 2020-07-08 DIAGNOSIS — J36 Peritonsillar abscess: Secondary | ICD-10-CM | POA: Diagnosis not present

## 2020-07-08 LAB — POCT RAPID STREP A (OFFICE): Rapid Strep A Screen: NEGATIVE

## 2020-07-08 MED ORDER — CEFTRIAXONE SODIUM 1 G IJ SOLR
1.0000 g | Freq: Once | INTRAMUSCULAR | Status: AC
  Administered 2020-07-08: 1 g via INTRAMUSCULAR

## 2020-07-08 NOTE — ED Triage Notes (Signed)
Pt presents with sore throat that began 2 days ago, swelling and pain on the left side only

## 2020-07-08 NOTE — Discharge Instructions (Addendum)
You have received Rocephin in the office today as an antibiotic injection.  Follow up with this office or with primary care if symptoms are persisting.  Follow up in the ER for high fever, trouble swallowing, trouble breathing, other concerning symptoms.

## 2020-07-08 NOTE — ED Provider Notes (Signed)
[place]   [id number] 07/08/20 Arrival Time: 1107  XI:PJAS THROAT  SUBJECTIVE: History from: patient.  Ann Moore is a 30 y.o. female who presents with abrupt onset of sore throat for the last 2 days. Reports that pain and swelling are only on the left side of the throat. Reports L lymphadenopathy as well. Denies sick exposure to Covid, strep, flu or mono, or precipitating event. Symptoms are made worse with swallowing, but tolerating liquids and own secretions without difficulty.  Denies previous symptoms in the past.     Denies fever, chills, fatigue, ear pain, sinus pain, rhinorrhea, nasal congestion, cough, SOB, wheezing, chest pain, nausea, rash, changes in bowel or bladder habits.     ROS: As per HPI.  All other pertinent ROS negative.     Past Medical History:  Diagnosis Date  . Abnormal Pap smear of cervix 09/08/2019   08/28/19: ASCUS w/ +HRHPV   Per 2019 ASCCP guidelines, needs colposcopy as soon as possible.  Immediate risk of CIN3+ is 4.4%.   Marland Kitchen Anxiety   . Headache   . Missed abortion 03/15/2014  . Nexplanon insertion 05/03/2016   Placed 05/03/16    05/09/19 removed  . Pregnant 08/20/2015  . Rubella non-immune status, antepartum 03/10/2014   MMR pp   . Trichomonal vaginitis 04/29/2015   04/29/2015   POC: neg 07/2015 Repeat positive 10/08/15, POC neg 10/26/2015 04/04/16 + again.    Marland Kitchen UTI (lower urinary tract infection) 08/20/2015  . Vaginal discharge 08/20/2015   Past Surgical History:  Procedure Laterality Date  . DILATION AND CURETTAGE OF UTERUS     Allergies  Allergen Reactions  . Hydrocodone Nausea And Vomiting and Rash   No current facility-administered medications on file prior to encounter.   Current Outpatient Medications on File Prior to Encounter  Medication Sig Dispense Refill  . acetaminophen (TYLENOL) 325 MG tablet Take 650 mg by mouth every 6 (six) hours as needed for mild pain, moderate pain or headache.     . benzonatate (TESSALON)  100 MG capsule Take 1 capsule (100 mg total) by mouth every 8 (eight) hours. 21 capsule 0  . escitalopram (LEXAPRO) 10 MG tablet Take 1 tablet (10 mg total) by mouth daily. (Patient not taking: Reported on 04/01/2020) 30 tablet 1  . LO LOESTRIN FE 1 MG-10 MCG / 10 MCG tablet Take 1 tablet by mouth daily. 3 Package 3  . promethazine (PHENERGAN) 25 MG tablet Take 1 tablet (25 mg total) by mouth every 6 (six) hours as needed for nausea or vomiting. 5 tablet 0  . rizatriptan (MAXALT) 5 MG tablet Take 1 tablet (5 mg total) by mouth as needed for migraine. May repeat in 2 hours if needed 10 tablet 0   Social History   Socioeconomic History  . Marital status: Single    Spouse name: Not on file  . Number of children: 2  . Years of education: Not on file  . Highest education level: Some college, no degree  Occupational History  . Not on file  Tobacco Use  . Smoking status: Current Every Day Smoker    Packs/day: 0.50    Years: 7.00    Pack years: 3.50    Types: Cigarettes  . Smokeless tobacco: Never Used  Vaping Use  . Vaping Use: Never used  Substance and Sexual Activity  . Alcohol use: No    Alcohol/week: 0.0 standard drinks  . Drug use: No  . Sexual activity: Yes    Birth  control/protection: Pill  Other Topics Concern  . Not on file  Social History Narrative   Lives with her 3 year son-Axon-father is in an out of photo   69 year old Rodman Key who lives with father [place]       Enjoys: outside, cleaning-likes to organize      Diet: eats all food groups-doesn't eat steak or lobster   Caffeine: 2-3 cans of soda, and ice coffee daily    Water: 6-8 cups daily      Wears seat belt   Does not use phone while driving   Oceanographer at home   Social Determinants of Health   Financial Resource Strain: Low Risk   . Difficulty of Paying Living Expenses: Not hard at all  Food Insecurity: No Food Insecurity  . Worried About Charity fundraiser in the Last Year: Never true  . Ran Out of  Food in the Last Year: Never true  Transportation Needs: No Transportation Needs  . Lack of Transportation (Medical): No  . Lack of Transportation (Non-Medical): No  Physical Activity: Inactive  . Days of Exercise per Week: 0 days  . Minutes of Exercise per Session: 0 min  Stress: No Stress Concern Present  . Feeling of Stress : Only a little  Social Connections: Socially Isolated  . Frequency of Communication with Friends and Family: More than three times a week  . Frequency of Social Gatherings with Friends and Family: More than three times a week  . Attends Religious Services: Never  . Active Member of Clubs or Organizations: No  . Attends Archivist Meetings: Never  . Marital Status: Never married  Intimate Partner Violence: Not At Risk  . Fear of Current or Ex-Partner: No  . Emotionally Abused: No  . Physically Abused: No  . Sexually Abused: No   Family History  Problem Relation Age of Onset  . Hypertension Mother   . Diabetes Sister   . Diabetes Maternal Grandfather   . Cancer Maternal Grandfather        liver, lung    OBJECTIVE:  Vitals:   07/08/20 1118  BP: 114/81  Pulse: 99  Resp: 18  Temp: 99.1 F (37.3 C)  SpO2: 96%     General appearance: alert; appears fatigued, but nontoxic, speaking in full sentences and managing own secretions HEENT: NCAT; Ears: EACs clear, TMs pearly gray with visible cone of light, without erythema; Eyes: PERRL, EOMI grossly; Nose: no obvious rhinorrhea; Throat: oropharynx erythematous, L tonsil 2+ swollen, erythematous with white and green drainage from the posterior of the tonsil  Neck: supple without LAD Lungs: CTA bilaterally without adventitious breath sounds; cough absent Heart: regular rate and rhythm.  Radial pulses 2+ symmetrical bilaterally Skin: warm and dry Psychological: alert and cooperative; normal mood and affect  LABS: Results for orders placed or performed during the hospital encounter of 07/08/20 (from  the past 24 hour(s))  POCT rapid strep A     Status: None   Collection Time: 07/08/20 11:23 AM  Result Value Ref Range   Rapid Strep A Screen Negative Negative     ASSESSMENT & PLAN:  1. Peritonsillar abscess determined by examination   2. Sore throat     Meds ordered this encounter  Medications  . cefTRIAXone (ROCEPHIN) injection 1 g    Rocephin in office today Treating for peritonsillar abscess Strep test negative, will send out for culture and we will call you with results Declines test for mono at this  time Get plenty of rest and push fluids Take OTC Zyrtec and use chloraseptic spray as needed for throat pain. Drink warm or cool liquids, use throat lozenges, or popsicles to help alleviate symptoms Take OTC ibuprofen or tylenol as needed for pain Follow up with PCP if symptoms persists Return or go to ER if patient has any new or worsening symptoms such as fever, chills, nausea, vomiting, worsening sore throat, cough, abdominal pain, chest pain, changes in bowel or bladder habits  Reviewed expectations re: course of current medical issues. Questions answered. Outlined signs and symptoms indicating need for more acute intervention. Patient verbalized understanding. After Visit Summary given.          Faustino Congress, NP 07/08/20 1136

## 2020-07-11 LAB — CULTURE, GROUP A STREP (THRC)

## 2020-08-12 ENCOUNTER — Other Ambulatory Visit (INDEPENDENT_AMBULATORY_CARE_PROVIDER_SITE_OTHER): Payer: Medicaid Other | Admitting: *Deleted

## 2020-08-12 ENCOUNTER — Other Ambulatory Visit (HOSPITAL_COMMUNITY)
Admission: RE | Admit: 2020-08-12 | Discharge: 2020-08-12 | Disposition: A | Discharge status: Home / Self Care | Payer: Medicaid Other | Source: Ambulatory Visit | Attending: Obstetrics & Gynecology | Admitting: Obstetrics & Gynecology

## 2020-08-12 ENCOUNTER — Other Ambulatory Visit: Payer: Self-pay

## 2020-08-12 DIAGNOSIS — N898 Other specified noninflammatory disorders of vagina: Secondary | ICD-10-CM

## 2020-08-12 NOTE — Progress Notes (Addendum)
° °  NURSE VISIT- VAGINITIS/STD  SUBJECTIVE:  Ann Moore is a 30 y.o. [id number] GYN patientfemale here for a vaginal swab for vaginitis screening, STD screen.  She reports the following symptoms: vaginal odor for 1 week. Denies abnormal vaginal bleeding, significant pelvic pain, fever, or UTI symptoms.  OBJECTIVE:  There were no vitals taken for this visit.  Appears well, in no apparent distress  ASSESSMENT: Vaginal swab for vaginitis screening & STD screen.  PLAN: Self-collected vaginal probe for Gonorrhea, Chlamydia, Trichomonas, Bacterial Vaginosis, Yeast sent to lab Treatment: to be determined once results are received Follow-up as needed if symptoms persist/worsen, or new symptoms develop  Malachy Mood  08/12/2020 3:18 PM  Chart reviewed for nurse visit. Agree with plan of care.  Cheral Marker, [place] 08/12/2020 3:24 PM

## 2020-08-17 ENCOUNTER — Other Ambulatory Visit: Payer: Self-pay | Admitting: Women's Health

## 2020-08-17 LAB — CERVICOVAGINAL ANCILLARY ONLY
Bacterial Vaginitis (gardnerella): POSITIVE — AB
Candida Glabrata: NEGATIVE
Candida Vaginitis: NEGATIVE
Chlamydia: NEGATIVE
Comment: NEGATIVE
Comment: NEGATIVE
Comment: NEGATIVE
Comment: NEGATIVE
Comment: NEGATIVE
Comment: NORMAL
Neisseria Gonorrhea: NEGATIVE
Trichomonas: NEGATIVE

## 2020-08-17 MED ORDER — METRONIDAZOLE 500 MG PO TABS: 500.0000 mg | ORAL_TABLET | Freq: Two times a day (BID) | ORAL | 0 refills | Status: DC

## 2020-08-25 ENCOUNTER — Ambulatory Visit (INDEPENDENT_AMBULATORY_CARE_PROVIDER_SITE_OTHER): Payer: Medicaid Other

## 2020-08-25 ENCOUNTER — Other Ambulatory Visit: Payer: Self-pay

## 2020-08-25 VITALS — BP 128/86 | HR 81 | Ht 64.0 in | Wt 157.2 lb

## 2020-08-25 DIAGNOSIS — N926 Irregular menstruation, unspecified: Secondary | ICD-10-CM

## 2020-08-25 LAB — POCT URINE PREGNANCY: Preg Test, Ur: POSITIVE — AB

## 2020-08-25 NOTE — Progress Notes (Addendum)
   NURSE VISIT- PREGNANCY CONFIRMATION   SUBJECTIVE:  Ann Moore is a 30 y.o. [id number] female at [redacted]w[redacted]d by certain LMP of Patient's last menstrual period was 07/26/2020 (exact date). Here for pregnancy confirmation.  Home pregnancy test: positive x 1  She reports nausea.  She is not taking prenatal vitamins.    OBJECTIVE:  BP 128/86 (BP Location: Right Arm, Patient Position: Sitting, Cuff Size: Normal)   Pulse 81   Ht 5\' 4"  (1.626 m)   Wt 157 lb 3.2 oz (71.3 kg)   LMP 07/26/2020 (Exact Date)   BMI 26.98 kg/m   Appears well, in no apparent distress OB History  Gravida Para Term Preterm AB Living  4 2 1 1 1 2   SAB IAB Ectopic Multiple Live Births  1 0 0 0 2    # Outcome Date GA Lbr Len/2nd Weight Sex Delivery Anes PTL Lv  4 Current           3 Preterm 02/20/16 [redacted]w[redacted]d 10:15 / 00:44 6 lb 5.6 oz (2.88 kg) M Vag-Spont EPI  LIV  2 Term 04/04/09 [redacted]w[redacted]d 17:00 7 lb 2 oz (3.232 kg) M Vag-Spont EPI N LIV  1 SAB              Birth Comments: 10wks w/ D&C @ [facility]    Results for orders placed or performed in visit on 08/25/20 (from the past 24 hour(s))  POCT urine pregnancy   Collection Time: 08/25/20  4:30 PM  Result Value Ref Range   Preg Test, Ur Positive (A) Negative    ASSESSMENT: Positive pregnancy test, [redacted]w[redacted]d by LMP    PLAN: Schedule for dating ultrasound in 3 weeks Prenatal vitamins: plans to begin OTC ASAP   Nausea medicines:Phenergan requested-note routed to 08/27/20, CNM to send prescription   OB packet given: Yes  Ann Moore A Ann Moore  08/25/2020 4:41 PM   Chart reviewed for nurse visit. Agree with plan of care. Rx diclegis Ann Moore, 08/27/2020 08/26/2020 9:44 AM

## 2020-08-26 ENCOUNTER — Other Ambulatory Visit: Payer: Self-pay | Admitting: Women's Health

## 2020-08-26 MED ORDER — DOXYLAMINE-PYRIDOXINE 10-10 MG PO TBEC: DELAYED_RELEASE_TABLET | ORAL | 6 refills | Status: DC

## 2020-08-26 MED ORDER — PROMETHAZINE HCL 25 MG PO TABS: 12.5000 mg | ORAL_TABLET | Freq: Four times a day (QID) | ORAL | 0 refills | Status: DC | PRN

## 2020-08-26 NOTE — Addendum Note (Signed)
Addended by: Cheral Marker on: 08/26/2020 09:44 AM   Modules accepted: Orders

## 2020-09-09 ENCOUNTER — Other Ambulatory Visit: Payer: Medicaid Other | Admitting: Women's Health

## 2020-09-16 ENCOUNTER — Other Ambulatory Visit: Payer: Self-pay | Admitting: Obstetrics & Gynecology

## 2020-09-16 DIAGNOSIS — O3680X0 Pregnancy with inconclusive fetal viability, not applicable or unspecified: Secondary | ICD-10-CM

## 2020-09-17 ENCOUNTER — Other Ambulatory Visit: Payer: Self-pay

## 2020-09-17 ENCOUNTER — Ambulatory Visit (INDEPENDENT_AMBULATORY_CARE_PROVIDER_SITE_OTHER): Payer: Medicaid Other

## 2020-09-17 DIAGNOSIS — O3680X0 Pregnancy with inconclusive fetal viability, not applicable or unspecified: Secondary | ICD-10-CM | POA: Diagnosis not present

## 2020-09-17 NOTE — Progress Notes (Signed)
[place] 7+4 wks,single IUP with YS,positive fht 150 bpm,normal ovaries,crl 12.26 mm

## 2020-09-30 ENCOUNTER — Other Ambulatory Visit: Payer: Self-pay | Admitting: Women's Health

## 2020-10-01 ENCOUNTER — Other Ambulatory Visit: Payer: Self-pay | Admitting: Obstetrics & Gynecology

## 2020-10-01 MED ORDER — PROMETHAZINE HCL 25 MG PO TABS: 12.5000 mg | ORAL_TABLET | Freq: Four times a day (QID) | ORAL | 0 refills | Status: DC | PRN

## 2020-10-01 NOTE — Telephone Encounter (Signed)
Reordered per request.

## 2020-10-20 ENCOUNTER — Other Ambulatory Visit: Payer: Self-pay | Admitting: Obstetrics & Gynecology

## 2020-10-20 DIAGNOSIS — Z3682 Encounter for antenatal screening for nuchal translucency: Secondary | ICD-10-CM

## 2020-10-21 ENCOUNTER — Ambulatory Visit (INDEPENDENT_AMBULATORY_CARE_PROVIDER_SITE_OTHER): Payer: Medicaid Other

## 2020-10-21 ENCOUNTER — Other Ambulatory Visit: Payer: Self-pay

## 2020-10-21 ENCOUNTER — Ambulatory Visit: Payer: Medicaid Other | Admitting: *Deleted

## 2020-10-21 ENCOUNTER — Encounter: Payer: Self-pay | Admitting: Women's Health

## 2020-10-21 ENCOUNTER — Ambulatory Visit (INDEPENDENT_AMBULATORY_CARE_PROVIDER_SITE_OTHER): Payer: Medicaid Other | Admitting: Women's Health

## 2020-10-21 VITALS — BP 122/73 | HR 98 | Wt 160.4 lb

## 2020-10-21 DIAGNOSIS — Z8751 Personal history of pre-term labor: Secondary | ICD-10-CM | POA: Insufficient documentation

## 2020-10-21 DIAGNOSIS — A749 Chlamydial infection, unspecified: Secondary | ICD-10-CM | POA: Diagnosis not present

## 2020-10-21 DIAGNOSIS — O0990 Supervision of high risk pregnancy, unspecified, unspecified trimester: Secondary | ICD-10-CM | POA: Insufficient documentation

## 2020-10-21 DIAGNOSIS — Z3481 Encounter for supervision of other normal pregnancy, first trimester: Secondary | ICD-10-CM | POA: Diagnosis not present

## 2020-10-21 DIAGNOSIS — Z3A12 12 weeks gestation of pregnancy: Secondary | ICD-10-CM | POA: Diagnosis not present

## 2020-10-21 DIAGNOSIS — F418 Other specified anxiety disorders: Secondary | ICD-10-CM | POA: Diagnosis not present

## 2020-10-21 DIAGNOSIS — Z113 Encounter for screening for infections with a predominantly sexual mode of transmission: Secondary | ICD-10-CM

## 2020-10-21 DIAGNOSIS — Z3480 Encounter for supervision of other normal pregnancy, unspecified trimester: Secondary | ICD-10-CM | POA: Diagnosis not present

## 2020-10-21 DIAGNOSIS — Z31430 Encounter of female for testing for genetic disease carrier status for procreative management: Secondary | ICD-10-CM | POA: Diagnosis not present

## 2020-10-21 DIAGNOSIS — Z3682 Encounter for antenatal screening for nuchal translucency: Secondary | ICD-10-CM

## 2020-10-21 DIAGNOSIS — Z3490 Encounter for supervision of normal pregnancy, unspecified, unspecified trimester: Secondary | ICD-10-CM | POA: Insufficient documentation

## 2020-10-21 LAB — POCT URINALYSIS DIPSTICK OB
Blood, UA: NEGATIVE
Glucose, UA: NEGATIVE
Ketones, UA: NEGATIVE
Leukocytes, UA: NEGATIVE
Nitrite, UA: NEGATIVE
POC,PROTEIN,UA: NEGATIVE

## 2020-10-21 MED ORDER — CITALOPRAM HYDROBROMIDE 20 MG PO TABS: ORAL_TABLET | ORAL | 2 refills | Status: DC

## 2020-10-21 MED ORDER — BLOOD PRESSURE MONITOR MISC: 0 refills | Status: DC

## 2020-10-21 NOTE — Progress Notes (Signed)
INITIAL OBSTETRICAL VISIT Patient name: Ann Moore MRN [id number]  Date of birth: Jul 05, 1990 Chief Complaint:   Initial Prenatal Visit  History of Present Illness:   Ann Moore is a 30 y.o. [id number] Caucasian female at [redacted]w[redacted]d by LMP c/w u/s at 7 weeks with an Estimated Date of Delivery: 05/02/21 being seen today for her initial obstetrical visit.   Her obstetrical history is significant for SAB x 1, term SVB x 1, 35wk PTB d/t PPROM/PTL.   Today she reports migraines, apap not helping. Dep/anx- no meds right now. Had 1st appt w/ Irenic therapy Tues, goes back next week. Gets easily irritated/over-stimulated, wants meds.  Depression screen [facility] 2/9 10/21/2020 02/03/2020 01/28/2020 12/23/2019 11/20/2019  Decreased Interest 1 1 1 2 3   Down, Depressed, Hopeless 0 0 2 0 3  PHQ - 2 Score 1 1 3 2 6   Altered sleeping 1 1 2 3 3   Tired, decreased energy 1 1 3 3 3   Change in appetite 0 0 1 3 0  Feeling bad or failure about yourself  0 0 1 1 0  Trouble concentrating 1 0 3 2 3   Moving slowly or fidgety/restless 0 1 3 3 3   Suicidal thoughts 0 0 0 0 0  PHQ-9 Score 4 4 16 17 18   Difficult doing work/chores - Somewhat difficult Very difficult Very difficult Very difficult    Patient's last menstrual period was 07/26/2020 (exact date). Last pap 08/28/19. Results were: ASCUS w/ HRHPV positive: other (not 16, 18/45) Review of Systems:   Pertinent items are noted in HPI Denies cramping/contractions, leakage of fluid, vaginal bleeding, abnormal vaginal discharge w/ itching/odor/irritation, headaches, visual changes, shortness of breath, chest pain, abdominal pain, severe nausea/vomiting, or problems with urination or bowel movements unless otherwise stated above.  Pertinent History Reviewed:  Reviewed past medical,surgical, social, obstetrical and family history.  Reviewed problem list, medications and allergies. OB History  Gravida Para Term Preterm AB Living  4 2 1 1 1 2   SAB IAB Ectopic Multiple  Live Births  1 0 0 0 2    # Outcome Date GA Lbr Len/2nd Weight Sex Delivery Anes PTL Lv  4 Current           3 Preterm 02/20/16 [redacted]w[redacted]d 10:15 / 00:44 6 lb 5.6 oz (2.88 kg) M Vag-Spont EPI Y LIV  2 Term 04/04/09 [redacted]w[redacted]d 17:00 7 lb 2 oz (3.232 kg) M Vag-Spont EPI N LIV  1 SAB              Birth Comments: 10wks w/ D&C @ [facility]   Physical Assessment:   Vitals:   10/21/20 1106  BP: 122/73  Pulse: 98  Weight: 160 lb 6.4 oz (72.8 kg)  Body mass index is 27.53 kg/m.       Physical Examination:  General appearance - well appearing, and in no distress  Mental status - alert, oriented to person, place, and time  Psych:  She has a normal mood and affect  Skin - warm and dry, normal color, no suspicious lesions noted  Chest - effort normal, all lung fields clear to auscultation bilaterally  Heart - normal rate and regular rhythm  Abdomen - soft, nontender  Extremities:  No swelling or varicosities noted  Thin prep pap is not done>declines today, wants next visit  Chaperone: N/A    TODAY'S NT  07/28/2020 12+3 wks,measurements c/w dates,CRL 57.59 mm,NB present,NT 2 mm,anterior placenta,normal ovaries  No results found for this or any previous visit (  from the past 24 hour(s)).  Assessment & Plan:  1) Low-Risk Pregnancy [id number] at [redacted]w[redacted]d with an Estimated Date of Delivery: 05/02/21   2) Initial OB visit  3) H/O 35wk PTB d/t PPROM/labor> offered Makena, wants, ordered today. Start @ 16wks  4) Dep/anx> easily irritated, will try celexa again. Continue therapy w/ Irenic therapy  5) H/O abnormal pap> declines pap today, wants next visit  6) Migraines> gave printed prevention/relief measures   Meds:  Meds ordered this encounter  Medications  . Blood Pressure Monitor MISC    Sig: For regular home bp monitoring during pregnancy    Dispense:  1 each    Refill:  0    Z34.81 Please mail to patient  . citalopram (CELEXA) 20 MG tablet    Sig: Take 10mg  (1/2 tablet) daily x 1 week, then 20mg  (1 tablet)  daily thereafter    Dispense:  30 tablet    Refill:  2    Order Specific Question:   Supervising Provider    Answer:   H [[id number]]    Initial labs obtained Continue prenatal vitamins Reviewed n/v relief measures and warning s/s to report Reviewed recommended weight gain based on pre-gravid BMI Encouraged well-balanced diet Genetic & carrier screening discussed: requests Panorama, NT/IT and Horizon 14  Ultrasound discussed; fetal survey: requested [facility] completed> form faxed if has or is planning to apply for medicaid The nature of for [facility] with multiple MDs and other Advanced Practice Providers was explained to patient; also emphasized that fellows, residents, and students are part of our team. Does not have home bp cuff. Office bp cuff given: no. Rx sent. Check bp weekly, let [employer] know if consistently >140/90.   Follow-up: Return in about 3 weeks (around 11/11/2020) for LROB w/ pap, 2nd IT, CNM, in person.   Orders Placed This Encounter  Procedures  . Urine Culture  . GC/Chlamydia Probe Amp  . Integrated 1  . Genetic Screening  . Pain Management Screening Profile (10S)  . CBC/D/Plt+RPR+Rh+ABO+RubIgG...  . POC Urinalysis Dipstick OB    [place] CNM, [facility] 10/21/2020 12:09 PM

## 2020-10-21 NOTE — Progress Notes (Signed)
[place] 12+3 wks,measurements c/w dates,CRL 57.59 mm,NB present,NT 2 mm,anterior placenta,normal ovaries

## 2020-10-21 NOTE — Patient Instructions (Addendum)
Ann Moore, I greatly value your feedback.  If you receive a survey following your visit with us today, we appreciate you taking the time to fill it out.  Thanks, [name], CNM, WHNP-BC   [facility] ([address] [place], [place] [address]) Entrance C, located off of [address] Free 24/7 valet parking   For Headaches:   Stay well hydrated, drink enough water so that your urine is clear, sometimes if you are dehydrated you can get headaches  Eat small frequent meals and snacks, sometimes if you are hungry you can get headaches  Sometimes you get headaches during pregnancy from the pregnancy hormones  You can try tylenol (1-2 regular strength 325mg  or 1-2 extra strength 500mg ) as directed on the box. The least amount of medication that works is best.   Cool compresses (cool wet washcloth or ice pack) to area of head that is hurting  You can also try drinking a caffeinated drink to see if this will help  If not helping, try below:  For Prevention of Headaches/Migraines:  CoQ10 100mg  three times daily  Vitamin B2 400mg  daily  Magnesium Oxide 400-600mg  daily  Foods to alleviate migraines:  1) dark leafy greens 2) avocado 3) tuna 4) salmon  5) beans and legumes  Foods to avoid: 1) Excessive (or irregular timing) coffee 3) aged cheeses 4) chocolate 5) citrus fruits 6) aspartame and other artifical sweeteners 7) yeast 8) MSG (in processed foods) 9) processed and cured meats 10) nuts and certain seeds 11) chicken livers and other organ meats 12) dairy products like buttermilk, sour cream, and yogurt 13) dried fruits like dates, figs, and raisins 14) garlic 15) onions 16) potato chips 17) pickled foods like olives and sauerkraut 18) some fresh fruits like ripe banana, papaya, red plums, raspberries, kiwi, pineapple 19) tomato-based products  Recommend to keep a migraine diary: rate daily the severity of your headache (1-10)  and what foods you eat that day to help determine patterns.   If You Get a Bad Headache/Migraine:  Benadryl 25mg    Magnesium Oxide  1 large Gatorade  2 extra strength Tylenol (1,000mg  total)  1 cup coffee or Coke      If this doesn't help please call us @ [phone]    Nausea & Vomiting  Have saltine crackers or pretzels by your bed and eat a few bites before you raise your head out of bed in the morning  Eat small frequent meals throughout the day instead of large meals  Drink plenty of fluids throughout the day to stay hydrated, just don't drink a lot of fluids with your meals.  This can make your stomach fill up faster making you feel sick  Do not brush your teeth right after you eat  Products with real ginger are good for nausea, like ginger ale and ginger hard candy Make sure it says made with real ginger!  Sucking on sour candy like lemon heads is also good for nausea  If your prenatal vitamins make you nauseated, take them at night so you will sleep through the nausea  Sea Bands  If you feel like you need medicine for the nausea & vomiting please let us know  If you are unable to keep any fluids or food down please let us know   Constipation  Drink plenty of fluid, preferably water, throughout the day  Eat foods high in fiber such as fruits, vegetables, and grains  Exercise,  such as walking, is a good way to keep your bowels regular  Drink warm fluids, especially warm prune juice, or decaf coffee  Eat a 1/2 cup of real oatmeal (not instant), 1/2 cup applesauce, and 1/2-1 cup warm prune juice every day  If needed, you may take Colace (docusate sodium) stool softener once or twice a day to help keep the stool soft.   If you still are having problems with constipation, you may take Miralax once daily as needed to help keep your bowels regular.   Home Blood Pressure Monitoring for Patients   Your provider has recommended that you check your blood  pressure (BP) at least once a week at home. If you do not have a blood pressure cuff at home, one will be provided for you. Contact your provider if you have not received your monitor within 1 week.   Helpful Tips for Accurate Home Blood Pressure Checks  . Don't smoke, exercise, or drink caffeine 30 minutes before checking your BP . Use the restroom before checking your BP (a full bladder can raise your pressure) . Relax in a comfortable upright chair . Feet on the ground . Left arm resting comfortably on a flat surface at the level of your heart . Legs uncrossed . Back supported . Sit quietly and don't talk . Place the cuff on your bare arm . Adjust snuggly, so that only two fingertips can fit between your skin and the top of the cuff . Check 2 readings separated by at least one minute . Keep a log of your BP readings . For a visual, please reference this diagram: http://ccnc.care/bpdiagram  Provider Name: [facility]     Phone: [phone]  Zone 1: ALL CLEAR  Continue to monitor your symptoms:  . BP reading is less than 140 (top number) or less than 90 (bottom number)  . No right upper stomach pain . No headaches or seeing spots . No feeling nauseated or throwing up . No swelling in face and hands  Zone 2: CAUTION Call your doctor's office for any of the following:  . BP reading is greater than 140 (top number) or greater than 90 (bottom number)  . Stomach pain under your ribs in the middle or right side . Headaches or seeing spots . Feeling nauseated or throwing up . Swelling in face and hands  Zone 3: EMERGENCY  Seek immediate medical care if you have any of the following:  . BP reading is greater than160 (top number) or greater than 110 (bottom number) . Severe headaches not improving with Tylenol . Serious difficulty catching your breath . Any worsening symptoms from Zone 2    First Trimester of Pregnancy The first trimester of pregnancy is from week 1 until  the end of week 12 (months 1 through 3). A week after a sperm fertilizes an egg, the egg will implant on the wall of the uterus. This embryo will begin to develop into a baby. Genes from you and your partner are forming the baby. The female genes determine whether the baby is a boy or a girl. At 6-8 weeks, the eyes and face are formed, and the heartbeat can be seen on ultrasound. At the end of 12 weeks, all the baby's organs are formed.  Now that you are pregnant, you will want to do everything you can to have a healthy baby. Two of the most important things are to get good prenatal care and to follow your health care  provider's instructions. Prenatal care is all the medical care you receive before the baby's birth. This care will help prevent, find, and treat any problems during the pregnancy and childbirth. BODY CHANGES Your body goes through many changes during pregnancy. The changes vary from woman to woman.   You may gain or lose a couple of pounds at first.  You may feel sick to your stomach (nauseous) and throw up (vomit). If the vomiting is uncontrollable, call your health care provider.  You may tire easily.  You may develop headaches that can be relieved by medicines approved by your health care provider.  You may urinate more often. Painful urination may mean you have a bladder infection.  You may develop heartburn as a result of your pregnancy.  You may develop constipation because certain hormones are causing the muscles that push waste through your intestines to slow down.  You may develop hemorrhoids or swollen, bulging veins (varicose veins).  Your breasts may begin to grow larger and become tender. Your nipples may stick out more, and the tissue that surrounds them (areola) may become darker.  Your gums may bleed and may be sensitive to brushing and flossing.  Dark spots or blotches (chloasma, mask of pregnancy) may develop on your face. This will likely fade after the baby is  born.  Your menstrual periods will stop.  You may have a loss of appetite.  You may develop cravings for certain kinds of food.  You may have changes in your emotions from day to day, such as being excited to be pregnant or being concerned that something may go wrong with the pregnancy and baby.  You may have more vivid and strange dreams.  You may have changes in your hair. These can include thickening of your hair, rapid growth, and changes in texture. Some women also have hair loss during or after pregnancy, or hair that feels dry or thin. Your hair will most likely return to normal after your baby is born. WHAT TO EXPECT AT YOUR PRENATAL VISITS During a routine prenatal visit:  You will be weighed to make sure you and the baby are growing normally.  Your blood pressure will be taken.  Your abdomen will be measured to track your baby's growth.  The fetal heartbeat will be listened to starting around week 10 or 12 of your pregnancy.  Test results from any previous visits will be discussed. Your health care provider may ask you:  How you are feeling.  If you are feeling the baby move.  If you have had any abnormal symptoms, such as leaking fluid, bleeding, severe headaches, or abdominal cramping.  If you have any questions. Other tests that may be performed during your first trimester include:  Blood tests to find your blood type and to check for the presence of any previous infections. They will also be used to check for low iron levels (anemia) and Rh antibodies. Later in the pregnancy, blood tests for diabetes will be done along with other tests if problems develop.  Urine tests to check for infections, diabetes, or protein in the urine.  An ultrasound to confirm the proper growth and development of the baby.  An amniocentesis to check for possible genetic problems.  Fetal screens for spina bifida and Down syndrome.  You may need other tests to make sure you and the  baby are doing well. HOME CARE INSTRUCTIONS  Medicines  Follow your health care provider's instructions regarding medicine use. Specific medicines may  be either safe or unsafe to take during pregnancy.  Take your prenatal vitamins as directed.  If you develop constipation, try taking a stool softener if your health care provider approves. Diet  Eat regular, well-balanced meals. Choose a variety of foods, such as meat or vegetable-based protein, fish, milk and low-fat dairy products, vegetables, fruits, and whole grain breads and cereals. Your health care provider will help you determine the amount of weight gain that is right for you.  Avoid raw meat and uncooked cheese. These carry germs that can cause birth defects in the baby.  Eating four or five small meals rather than three large meals a day may help relieve nausea and vomiting. If you start to feel nauseous, eating a few soda crackers can be helpful. Drinking liquids between meals instead of during meals also seems to help nausea and vomiting.  If you develop constipation, eat more high-fiber foods, such as fresh vegetables or fruit and whole grains. Drink enough fluids to keep your urine clear or pale yellow. Activity and Exercise  Exercise only as directed by your health care provider. Exercising will help you:  Control your weight.  Stay in shape.  Be prepared for labor and delivery.  Experiencing pain or cramping in the lower abdomen or low back is a good sign that you should stop exercising. Check with your health care provider before continuing normal exercises.  Try to avoid standing for long periods of time. Move your legs often if you must stand in one place for a long time.  Avoid heavy lifting.  Wear low-heeled shoes, and practice good posture.  You may continue to have sex unless your health care provider directs you otherwise. Relief of Pain or Discomfort  Wear a good support bra for breast tenderness.     Take warm sitz baths to soothe any pain or discomfort caused by hemorrhoids. Use hemorrhoid cream if your health care provider approves.    Rest with your legs elevated if you have leg cramps or low back pain.  If you develop varicose veins in your legs, wear support hose. Elevate your feet for 15 minutes, 3-4 times a day. Limit salt in your diet. Prenatal Care  Schedule your prenatal visits by the twelfth week of pregnancy. They are usually scheduled monthly at first, then more often in the last 2 months before delivery.  Write down your questions. Take them to your prenatal visits.  Keep all your prenatal visits as directed by your health care provider. Safety  Wear your seat belt at all times when driving.  Make a list of emergency phone numbers, including numbers for family, friends, the hospital, and police and fire departments. General Tips  Ask your health care provider for a referral to a local prenatal education class. Begin classes no later than at the beginning of month 6 of your pregnancy.  Ask for help if you have counseling or nutritional needs during pregnancy. Your health care provider can offer advice or refer you to specialists for help with various needs.  Do not use hot tubs, steam rooms, or saunas.  Do not douche or use tampons or scented sanitary pads.  Do not cross your legs for long periods of time.  Avoid cat litter boxes and soil used by cats. These carry germs that can cause birth defects in the baby and possibly loss of the fetus by miscarriage or stillbirth.  Avoid all smoking, herbs, alcohol, and medicines not prescribed by your health care provider.  Chemicals in these affect the formation and growth of the baby.  Schedule a dentist appointment. At home, brush your teeth with a soft toothbrush and be gentle when you floss. SEEK MEDICAL CARE IF:   You have dizziness.  You have mild pelvic cramps, pelvic pressure, or nagging pain in the abdominal  area.  You have persistent nausea, vomiting, or diarrhea.  You have a bad smelling vaginal discharge.  You have pain with urination.  You notice increased swelling in your face, hands, legs, or ankles. SEEK IMMEDIATE MEDICAL CARE IF:   You have a fever.  You are leaking fluid from your vagina.  You have spotting or bleeding from your vagina.  You have severe abdominal cramping or pain.  You have rapid weight gain or loss.  You vomit blood or material that looks like coffee grounds.  You are exposed to [place] measles and have never had them.  You are exposed to fifth disease or chickenpox.  You develop a severe headache.  You have shortness of breath.  You have any kind of trauma, such as from a fall or a car accident. Document Released: 05/09/2001 Document Revised: 09/29/2013 Document Reviewed: 03/25/2013 [facility] Patient Information 2015 [place], [place]. This information is not intended to replace advice given to you by your health care provider. Make sure you discuss any questions you have with your health care provider.

## 2020-10-22 ENCOUNTER — Encounter: Payer: Self-pay | Admitting: Women's Health

## 2020-10-22 LAB — PMP SCREEN PROFILE (10S), URINE
Amphetamine Scrn, Ur: NEGATIVE ng/mL
BARBITURATE SCREEN URINE: NEGATIVE ng/mL
BENZODIAZEPINE SCREEN, URINE: NEGATIVE ng/mL
CANNABINOIDS UR QL SCN: POSITIVE ng/mL — AB
Cocaine (Metab) Scrn, Ur: NEGATIVE ng/mL
Creatinine(Crt), U: 63.3 mg/dL (ref 20.0–300.0)
Methadone Screen, Urine: NEGATIVE ng/mL
OXYCODONE+OXYMORPHONE UR QL SCN: NEGATIVE ng/mL
Opiate Scrn, Ur: NEGATIVE ng/mL
Ph of Urine: 6.4 (ref 4.5–8.9)
Phencyclidine Qn, Ur: NEGATIVE ng/mL
Propoxyphene Scrn, Ur: NEGATIVE ng/mL

## 2020-10-22 LAB — MED LIST OPTION NOT SELECTED

## 2020-10-23 LAB — HCV INTERPRETATION

## 2020-10-23 LAB — CBC/D/PLT+RPR+RH+ABO+RUBIGG...
Antibody Screen: NEGATIVE
Basophils Absolute: 0 10*3/uL (ref 0.0–0.2)
Basos: 0 %
EOS (ABSOLUTE): 0.1 10*3/uL (ref 0.0–0.4)
Eos: 1 %
HCV Ab: <0.1 s/co ratio (ref 0.0–0.9)
HIV Screen 4th Generation wRfx: NONREACTIVE
Hematocrit: 37.7 % (ref 34.0–46.6)
Hemoglobin: 12.7 g/dL (ref 11.1–15.9)
Hepatitis B Surface Ag: NEGATIVE
Immature Grans (Abs): 0 10*3/uL (ref 0.0–0.1)
Immature Granulocytes: 0 %
Lymphocytes Absolute: 2.4 10*3/uL (ref 0.7–3.1)
Lymphs: 35 %
MCH: 29.3 pg (ref 26.6–33.0)
MCHC: 33.7 g/dL (ref 31.5–35.7)
MCV: 87 fL (ref 79–97)
Monocytes Absolute: 0.5 10*3/uL (ref 0.1–0.9)
Monocytes: 7 %
Neutrophils Absolute: 4 10*3/uL (ref 1.4–7.0)
Neutrophils: 57 %
Platelets: 226 10*3/uL (ref 150–450)
RBC: 4.33 x10E6/uL (ref 3.77–5.28)
RDW: 13 % (ref 11.7–15.4)
RPR Ser Ql: NONREACTIVE
Rh Factor: POSITIVE
Rubella Antibodies, IGG: <0.9 index — ABNORMAL LOW (ref >0.99)
WBC: 7 10*3/uL (ref 3.4–10.8)

## 2020-10-23 LAB — INTEGRATED 1
Crown Rump Length: 57.6 mm
Gest. Age on Collection Date: 12.1 weeks
Maternal Age at EDD: 29.5 yr
Nuchal Translucency (NT): 2 mm
Number of Fetuses: 1
PAPP-A Value: 1001.3 ng/mL
Weight: 160 [lb_av]

## 2020-10-23 LAB — GC/CHLAMYDIA PROBE AMP
Chlamydia trachomatis, NAA: NEGATIVE
Neisseria Gonorrhoeae by PCR: NEGATIVE

## 2020-10-27 ENCOUNTER — Other Ambulatory Visit: Payer: Self-pay | Admitting: Advanced Practice Midwife

## 2020-10-27 DIAGNOSIS — O99891 Other specified diseases and conditions complicating pregnancy: Secondary | ICD-10-CM | POA: Insufficient documentation

## 2020-10-27 DIAGNOSIS — R8271 Bacteriuria: Secondary | ICD-10-CM

## 2020-10-27 DIAGNOSIS — O2340 Unspecified infection of urinary tract in pregnancy, unspecified trimester: Secondary | ICD-10-CM

## 2020-10-27 LAB — URINE CULTURE

## 2020-10-27 MED ORDER — NITROFURANTOIN MONOHYD MACRO 100 MG PO CAPS: 100.0000 mg | ORAL_CAPSULE | Freq: Two times a day (BID) | ORAL | 0 refills | Status: DC

## 2020-11-01 ENCOUNTER — Encounter: Payer: Self-pay | Admitting: Women's Health

## 2020-11-01 ENCOUNTER — Telehealth: Payer: Self-pay | Admitting: Women's Health

## 2020-11-01 NOTE — Telephone Encounter (Signed)
Patient called stating that she was suppose to get a BP cuff mailed to her home but she has never received it, but when I look in her chart it looks like it was sent to a different pharmacy. Please contact pt

## 2020-11-11 ENCOUNTER — Encounter: Payer: Self-pay | Admitting: Family Medicine

## 2020-11-11 ENCOUNTER — Other Ambulatory Visit: Payer: Self-pay

## 2020-11-11 ENCOUNTER — Other Ambulatory Visit (HOSPITAL_COMMUNITY)
Admission: RE | Admit: 2020-11-11 | Discharge: 2020-11-11 | Disposition: A | Discharge status: Home / Self Care | Payer: Medicaid Other | Source: Ambulatory Visit | Attending: Family Medicine | Admitting: Family Medicine

## 2020-11-11 ENCOUNTER — Ambulatory Visit (INDEPENDENT_AMBULATORY_CARE_PROVIDER_SITE_OTHER): Payer: Medicaid Other | Admitting: Family Medicine

## 2020-11-11 VITALS — BP 120/71 | HR 99 | Wt 161.8 lb

## 2020-11-11 DIAGNOSIS — Z124 Encounter for screening for malignant neoplasm of cervix: Secondary | ICD-10-CM | POA: Diagnosis not present

## 2020-11-11 DIAGNOSIS — Z2839 Other underimmunization status: Secondary | ICD-10-CM

## 2020-11-11 DIAGNOSIS — Z3480 Encounter for supervision of other normal pregnancy, unspecified trimester: Secondary | ICD-10-CM | POA: Diagnosis not present

## 2020-11-11 DIAGNOSIS — R519 Headache, unspecified: Secondary | ICD-10-CM

## 2020-11-11 DIAGNOSIS — O09899 Supervision of other high risk pregnancies, unspecified trimester: Secondary | ICD-10-CM

## 2020-11-11 DIAGNOSIS — F1290 Cannabis use, unspecified, uncomplicated: Secondary | ICD-10-CM

## 2020-11-11 DIAGNOSIS — Z3A15 15 weeks gestation of pregnancy: Secondary | ICD-10-CM | POA: Insufficient documentation

## 2020-11-11 DIAGNOSIS — Z720 Tobacco use: Secondary | ICD-10-CM

## 2020-11-11 DIAGNOSIS — O99891 Other specified diseases and conditions complicating pregnancy: Secondary | ICD-10-CM

## 2020-11-11 DIAGNOSIS — Z8751 Personal history of pre-term labor: Secondary | ICD-10-CM

## 2020-11-11 DIAGNOSIS — B3731 Acute candidiasis of vulva and vagina: Secondary | ICD-10-CM

## 2020-11-11 DIAGNOSIS — R87610 Atypical squamous cells of undetermined significance on cytologic smear of cervix (ASC-US): Secondary | ICD-10-CM

## 2020-11-11 DIAGNOSIS — B373 Candidiasis of vulva and vagina: Secondary | ICD-10-CM

## 2020-11-11 DIAGNOSIS — F418 Other specified anxiety disorders: Secondary | ICD-10-CM

## 2020-11-11 DIAGNOSIS — R8271 Bacteriuria: Secondary | ICD-10-CM

## 2020-11-11 MED ORDER — TERCONAZOLE 0.4 % VA CREA: 1.0000 | TOPICAL_CREAM | Freq: Every day | VAGINAL | 3 refills | Status: DC

## 2020-11-11 NOTE — Progress Notes (Signed)
PRENATAL VISIT NOTE  Subjective:  Ann Moore is a 30 y.o. [id number] at 62w3dbeing seen today for ongoing prenatal care.  She is currently monitored for the following issues for this low-risk pregnancy and has Rubella non-immune status, antepartum; Depression with anxiety; Marijuana use; Abnormal Pap smear of cervix; Generalized headaches; Nicotine abuse; Adult ADHD; Chlamydia; Encounter for supervision of normal pregnancy, antepartum; History of preterm delivery; and Asymptomatic bacteriuria during pregnancy on their problem list.  Patient reports fatigue, headache, and vaginal discharge .  Contractions: Not present. Vag. Bleeding: None.   . Denies leaking of fluid.   The following portions of the patient's history were reviewed and updated as appropriate: allergies, current medications, past family history, past medical history, past social history, past surgical history and problem list.   Objective:   Vitals:   11/11/20 0911  BP: 120/71  Pulse: 99  Weight: 161 lb 12.8 oz (73.4 kg)    Fetal Status: Fetal Heart Rate (bpm): 145         General:  Alert, oriented and cooperative. Patient is in no acute distress.  Skin: Skin is warm and dry. No rash noted.   Cardiovascular: Normal heart rate noted  Respiratory: Normal respiratory effort, no problems with respiration noted  Abdomen: Soft, gravid, appropriate for gestational age.  Pain/Pressure: Absent     Pelvic: Normal appearing cervix and external genitalia on speculum exam. Thick white discharge noted.         Extremities: Normal range of motion.  Edema: None  Mental Status: Normal mood and affect. Normal behavior. Normal judgment and thought content.   Assessment and Plan:  Pregnancy: [id number] 161w3dSupervision of other normal pregnancy, antepartum -doing well, complaints as noted below -VSS -discussed contraception, still undecided -     Cytology - PAP -     Urine Culture -     INTEGRATED 2  [redacted] weeks gestation of  pregnancy -     Cytology - PAP -     Urine Culture -     INTEGRATED 2  Screening for cervical cancer -     Cytology - PAP  Asymptomatic bacteriuria during pregnancy +5/26, treated. Repeat urine culture collected today.  History of preterm delivery 17HP to start 6/20 at 16 weeks (rx previously ordered).  Marijuana use  Depression with anxiety Patient previously prescribed celexa, didn't think it was helping so self discontinued after 2-3 weeks. Discussed that SSRI/SNRIs take 6-8 weeks to reach steady state and see effects. Will restart.  Rubella non-immune status, antepartum MMR postpartum.  Atypical squamous cells of undetermined significance on cytologic smear of cervix (ASC-US) 08/28/19 ASCUS, +HRHPV, colpo without biopsies. Repeat performed today.  Generalized headaches Likely in the setting of dehydration. Discussed increasing hydration, tylenol PRN, adequate sleep. Discussed trial of flexeril if conservative management ineffective.  Nicotine abuse  Candida vaginitis -     terconazole (TERAZOL 7) 0.4 % vaginal cream; Place 1 applicator vaginally at bedtime for 7 days.    Preterm labor symptoms and general obstetric precautions including but not limited to vaginal bleeding, contractions, leaking of fluid and fetal movement were reviewed in detail with the patient. Please refer to After Visit Summary for other counseling recommendations.   Return in about 4 weeks (around 12/09/2020) [place] Future Appointments  Date Time Provider [place]6/20/2022  9:50 AM [facility] NURSE [facility]  11/22/2020 10:10 AM [facility] NURSE [facility]  11/30/2020  2:50 PM [facility] NURSE [facility]  12/06/2020 11:30 AM [facility] NURSE [facility]  12/09/2020  9:10 AM Christin Fudge, CNM [facility]  12/13/2020  9:50 AM [facility] NURSE [facility]  12/20/2020  9:50 AM [facility] NURSE [facility]  12/27/2020  9:50 AM [facility] NURSE [facility]  01/03/2021  9:50 AM [facility] NURSE [facility]  01/10/2021 10:10 AM [facility] NURSE [facility]  01/24/2021  9:50 AM [facility] NURSE [facility]  02/01/2021  9:50 AM [facility] NURSE [facility]  02/07/2021  9:50 AM [facility] NURSE [facility]  02/14/2021  9:50 AM [facility] NURSE [facility]  02/21/2021  9:50 AM [facility] NURSE [facility]  02/28/2021  9:50 AM [facility] NURSE [facility]  03/07/2021  9:50 AM [facility] NURSE [facility]  03/14/2021  9:50 AM [facility]URSE [facility]  03/21/2021  9:50 AM [facility]URSE [facility]  03/28/2021  9:50 AM [facility] NURSE [facility]  04/04/2021  9:50 AM [facility] NURSE [facility]    Arrie Senate, MD

## 2020-11-13 LAB — INTEGRATED 2
AFP MoM: 1.27
Alpha-Fetoprotein: 35.6 ng/mL
Crown Rump Length: 57.6 mm
DIA MoM: 1.65
DIA Value: 259.2 pg/mL
Estriol, Unconjugated: 0.79 ng/mL
Gest. Age on Collection Date: 12.1 weeks
Gestational Age: 15.1 weeks
Maternal Age at EDD: 29.5 yr
Nuchal Translucency (NT): 2 mm
Nuchal Translucency MoM: 1.56
Number of Fetuses: 1
PAPP-A MoM: 1.25
PAPP-A Value: 1001.3 ng/mL
Test Results:: NEGATIVE
Weight: 160 [lb_av]
Weight: 160 [lb_av]
hCG MoM: 1.37
hCG Value: 61 IU/mL
uE3 MoM: 1.07

## 2020-11-14 ENCOUNTER — Other Ambulatory Visit: Payer: Self-pay

## 2020-11-14 ENCOUNTER — Inpatient Hospital Stay (HOSPITAL_COMMUNITY)
Admission: RE | Admit: 2020-11-14 | Discharge: 2020-11-14 | Disposition: A | Discharge status: Home / Self Care | Payer: Medicaid Other | Attending: Family Medicine | Admitting: Family Medicine

## 2020-11-14 ENCOUNTER — Encounter (HOSPITAL_COMMUNITY): Payer: Self-pay | Admitting: Family Medicine

## 2020-11-14 DIAGNOSIS — O26859 Spotting complicating pregnancy, unspecified trimester: Secondary | ICD-10-CM

## 2020-11-14 DIAGNOSIS — Z3A15 15 weeks gestation of pregnancy: Secondary | ICD-10-CM | POA: Insufficient documentation

## 2020-11-14 DIAGNOSIS — O26852 Spotting complicating pregnancy, second trimester: Secondary | ICD-10-CM | POA: Insufficient documentation

## 2020-11-14 LAB — URINE CULTURE

## 2020-11-14 MED ORDER — METRONIDAZOLE 500 MG PO TABS: 500.0000 mg | ORAL_TABLET | Freq: Two times a day (BID) | ORAL | 0 refills | Status: DC

## 2020-11-14 NOTE — MAU Note (Signed)
Presents with c/o intermittent dizziness, lower abdominal cramping, and pink tinged vaginal discharge.  Reports cramping began today @ approximately 0900.  States been using med for yeast infection, been using x2 days and noticed pinkish discharge today.  Denies VB.

## 2020-11-14 NOTE — MAU Provider Note (Signed)
History     CSN: [id number]  Arrival date and time: 11/14/20 1328   Event Date/Time   First Provider Initiated Contact with Patient 11/14/20 1414      Chief Complaint  Patient presents with   Dizziness   Cramping   Vaginal Discharge   HPI Ann Moore is a 30 y.o. [id number] at 78w6dwho presents with cramping and vaginal bleeding. She reports she was seen in the office this week and told she had a yeast infection. She has used vaginal cream 2 days and today noticed she was spotting. She also reports intermittent cramping. She denies any leaking. She reports feeling like she has BV and not a yeast infection.  OB History     Gravida  4   Para  2   Term  1   Preterm  1   AB  1   Living  2      SAB  1   IAB  0   Ectopic  0   Multiple  0   Live Births  2           Past Medical History:  Diagnosis Date   Abnormal Pap smear of cervix 09/08/2019   08/28/19: ASCUS w/ +HRHPV   Per 2019 ASCCP guidelines, needs colposcopy as soon as possible.  Immediate risk of CIN3+ is 4.4%.    Anxiety    Headache    Missed abortion 03/15/2014   Nexplanon insertion 05/03/2016   Placed 05/03/16    05/09/19 removed   Pregnant 08/20/2015   Rubella non-immune status, antepartum 03/10/2014   MMR pp    Trichomonal vaginitis 04/29/2015   04/29/2015   POC: neg 07/2015 Repeat positive 10/08/15, POC neg 10/26/2015 04/04/16 + again.     UTI (lower urinary tract infection) 08/20/2015   Vaginal discharge 08/20/2015    Past Surgical History:  Procedure Laterality Date   DILATION AND CURETTAGE OF UTERUS      Family History  Problem Relation Age of Onset   Hypertension Mother    Diabetes Sister    Diabetes Maternal Grandfather    Cancer Maternal Grandfather        liver, lung    Social History   Tobacco Use   Smoking status: Never   Smokeless tobacco: Never  Vaping Use   Vaping Use: Former  Substance Use Topics   Alcohol use: No    Alcohol/week: 0.0 standard drinks   Drug  use: No    Allergies:  Allergies  Allergen Reactions   Hydrocodone Nausea And Vomiting and Rash    Medications Prior to Admission  Medication Sig Dispense Refill Last Dose   citalopram (CELEXA) 20 MG tablet Take 137m(1/2 tablet) daily x 1 week, then 2053m1 tablet) daily thereafter 30 tablet 2 Past Week   terconazole (TERAZOL 7) 0.4 % vaginal cream Place 1 applicator vaginally at bedtime for 7 days. 45 g 3 11/13/2020   acetaminophen (TYLENOL) 325 MG tablet Take 650 mg by mouth every 6 (six) hours as needed for mild pain, moderate pain or headache.       Blood Pressure Monitor MISC For regular home bp monitoring during pregnancy 1 each 0    Doxylamine-Pyridoxine (DICLEGIS) 10-10 MG TBEC 2 tabs q hs, if sx persist add 1 tab q am on day 3, if sx persist add 1 tab q afternoon on day 4 (Patient not taking: Reported on 11/11/2020) 100 tablet 6    Prenatal Vit-Fe Fumarate-FA (PRENATAL VITAMIN PO) Take  by mouth.      promethazine (PHENERGAN) 25 MG tablet Take 0.5-1 tablets (12.5-25 mg total) by mouth every 6 (six) hours as needed for nausea or vomiting. 30 tablet 0     Review of Systems  Constitutional: Negative.  Negative for fatigue and fever.  HENT: Negative.    Respiratory: Negative.  Negative for shortness of breath.   Cardiovascular: Negative.  Negative for chest pain.  Gastrointestinal:  Positive for abdominal pain. Negative for constipation, diarrhea, nausea and vomiting.  Genitourinary:  Positive for vaginal bleeding. Negative for dysuria and vaginal discharge.  Neurological: Negative.  Negative for dizziness and headaches.  Physical Exam   Blood pressure 118/71, pulse (!) 109, temperature 98.4 F (36.9 C), temperature source Oral, resp. rate 18, height '5\' 4"'  (1.626 m), weight 73.6 kg, last menstrual period 07/26/2020, SpO2 98 %.  Physical Exam Vitals and nursing note reviewed.  Constitutional:      General: She is not in acute distress.    Appearance: She is well-developed.   HENT:     Head: Normocephalic.  Eyes:     Pupils: Pupils are equal, round, and reactive to light.  Cardiovascular:     Rate and Rhythm: Normal rate and regular rhythm.     Heart sounds: Normal heart sounds.  Pulmonary:     Effort: Pulmonary effort is normal. No respiratory distress.     Breath sounds: Normal breath sounds.  Abdominal:     General: Bowel sounds are normal. There is no distension.     Palpations: Abdomen is soft.     Tenderness: There is no abdominal tenderness.  Genitourinary:    Comments: Pelvic exam: Cervix pink, visually closed, without lesion, scant white creamy discharge, vaginal walls and external genitalia normal Bimanual exam: Cervix 0/long/high, firm, anterior, neg CMT, uterus nontender, adnexa without tenderness, enlargement, or mass   Skin:    General: Skin is warm and dry.  Neurological:     Mental Status: She is alert and oriented to person, place, and time.  Psychiatric:        Mood and Affect: Mood normal.        Behavior: Behavior normal.        Thought Content: Thought content normal.        Judgment: Judgment normal.   Cervix: closed/thick/posterior  FHT: 146 bpm  MAU Course  Procedures  MDM UA- patient states she has a shy bladder and does not want to leave a sample Declines repeat vaginal swabs. Requests treatment for BV  Assessment and Plan   1. Spotting in pregnancy   2. [redacted] weeks gestation of pregnancy    -Discharge home in stable condition -Rx for metronidazole sent to patient's pharmacy -Second trimester precautions discussed -Patient advised to follow-up with OB as scheduled for prenatal care -Patient may return to MAU as needed or if her condition were to change or worsen   [place] 11/14/2020, 2:14 PM

## 2020-11-14 NOTE — Discharge Instructions (Signed)

## 2020-11-15 ENCOUNTER — Ambulatory Visit (INDEPENDENT_AMBULATORY_CARE_PROVIDER_SITE_OTHER): Payer: Medicaid Other | Admitting: *Deleted

## 2020-11-15 VITALS — Ht 64.0 in | Wt 162.5 lb

## 2020-11-15 DIAGNOSIS — Z3A16 16 weeks gestation of pregnancy: Secondary | ICD-10-CM

## 2020-11-15 DIAGNOSIS — O09892 Supervision of other high risk pregnancies, second trimester: Secondary | ICD-10-CM | POA: Diagnosis not present

## 2020-11-15 LAB — CYTOLOGY - PAP
Chlamydia: NEGATIVE
Comment: NEGATIVE
Comment: NEGATIVE
Comment: NORMAL
Diagnosis: NEGATIVE
High risk HPV: NEGATIVE
Neisseria Gonorrhea: NEGATIVE

## 2020-11-15 MED ORDER — HYDROXYPROGESTERONE CAPROATE 275 MG/1.1ML ~~LOC~~ SOAJ
275.0000 mg | Freq: Once | SUBCUTANEOUS | Status: AC
  Administered 2020-11-15: 275 mg via SUBCUTANEOUS

## 2020-11-15 NOTE — Progress Notes (Signed)
   NURSE VISIT- INJECTION  SUBJECTIVE:  Ann Moore is a 30 y.o. [phone] female here for a Makena for history of preterm birth. She is [redacted]w[redacted]d pregnant.   OBJECTIVE:  Ht 5\' 4"  (1.626 m)   Wt 162 lb 8 oz (73.7 kg)   LMP 07/26/2020 (Exact Date)   BMI 27.89 kg/m   Appears well, in no apparent distress  Injection administered in: Right arm  Meds ordered this encounter  Medications   HYDROXYprogesterone caproate (Makena) autoinjector 275 mg    ASSESSMENT: Pregnancy [redacted]w[redacted]d Makena for history of preterm birth PLAN: Follow-up: in 1 week for next [redacted]w[redacted]d  11/15/2020 10:10 AM

## 2020-11-22 ENCOUNTER — Other Ambulatory Visit: Payer: Self-pay

## 2020-11-22 ENCOUNTER — Ambulatory Visit (INDEPENDENT_AMBULATORY_CARE_PROVIDER_SITE_OTHER): Payer: Medicaid Other | Admitting: *Deleted

## 2020-11-22 VITALS — BP 116/72 | HR 84

## 2020-11-22 DIAGNOSIS — Z8751 Personal history of pre-term labor: Secondary | ICD-10-CM | POA: Diagnosis not present

## 2020-11-22 DIAGNOSIS — Z3480 Encounter for supervision of other normal pregnancy, unspecified trimester: Secondary | ICD-10-CM | POA: Diagnosis not present

## 2020-11-22 DIAGNOSIS — Z3A17 17 weeks gestation of pregnancy: Secondary | ICD-10-CM

## 2020-11-22 MED ORDER — HYDROXYPROGESTERONE CAPROATE 275 MG/1.1ML ~~LOC~~ SOAJ
275.0000 mg | SUBCUTANEOUS | Status: AC
  Administered 2020-11-22 – 2021-03-21 (×12): 275 mg via SUBCUTANEOUS

## 2020-11-22 NOTE — Progress Notes (Signed)
   NURSE VISIT- INJECTION  SUBJECTIVE:  Ann Moore is a 30 y.o. [phone] female here for a Makena for history of preterm birth. She is [redacted]w[redacted]d pregnant.   OBJECTIVE:  BP 116/72 (BP Location: Left Arm, Patient Position: Sitting, Cuff Size: Normal)   Pulse 84   LMP 07/26/2020 (Exact Date)   Appears well, in no apparent distress  Injection administered in: Left arm  Meds ordered this encounter  Medications   HYDROXYprogesterone caproate (Makena) autoinjector 275 mg    ASSESSMENT: Pregnancy [redacted]w[redacted]d Makena for history of preterm birth PLAN: Follow-up: in 1 week for next Volney Presser  11/22/2020 10:54 AM

## 2020-11-28 ENCOUNTER — Other Ambulatory Visit: Payer: Self-pay

## 2020-11-28 ENCOUNTER — Encounter (HOSPITAL_COMMUNITY): Payer: Self-pay | Admitting: *Deleted

## 2020-11-28 ENCOUNTER — Emergency Department (HOSPITAL_COMMUNITY)
Admission: EM | Admit: 2020-11-28 | Discharge: 2020-11-28 | Disposition: A | Discharge status: Home / Self Care | Payer: Medicaid Other | Attending: Emergency Medicine | Admitting: Emergency Medicine

## 2020-11-28 DIAGNOSIS — Z3A17 17 weeks gestation of pregnancy: Secondary | ICD-10-CM | POA: Diagnosis not present

## 2020-11-28 DIAGNOSIS — E871 Hypo-osmolality and hyponatremia: Secondary | ICD-10-CM | POA: Insufficient documentation

## 2020-11-28 DIAGNOSIS — Z87891 Personal history of nicotine dependence: Secondary | ICD-10-CM | POA: Diagnosis not present

## 2020-11-28 DIAGNOSIS — O209 Hemorrhage in early pregnancy, unspecified: Secondary | ICD-10-CM | POA: Insufficient documentation

## 2020-11-28 DIAGNOSIS — O4690 Antepartum hemorrhage, unspecified, unspecified trimester: Secondary | ICD-10-CM

## 2020-11-28 DIAGNOSIS — O26852 Spotting complicating pregnancy, second trimester: Secondary | ICD-10-CM | POA: Diagnosis not present

## 2020-11-28 LAB — CBC
HCT: 31.8 % — ABNORMAL LOW (ref 36.0–46.0)
Hemoglobin: 10.7 g/dL — ABNORMAL LOW (ref 12.0–15.0)
MCH: 30.7 pg (ref 26.0–34.0)
MCHC: 33.6 g/dL (ref 30.0–36.0)
MCV: 91.1 fL (ref 80.0–100.0)
Platelets: 168 10*3/uL (ref 150–400)
RBC: 3.49 MIL/uL — ABNORMAL LOW (ref 3.87–5.11)
RDW: 14 % (ref 11.5–15.5)
WBC: 6.7 10*3/uL (ref 4.0–10.5)
nRBC: 0 % (ref 0.0–0.2)

## 2020-11-28 LAB — BASIC METABOLIC PANEL
Anion gap: 5 (ref 5–15)
BUN: 8 mg/dL (ref 6–20)
CO2: 24 mmol/L (ref 22–32)
Calcium: 8.1 mg/dL — ABNORMAL LOW (ref 8.9–10.3)
Chloride: 103 mmol/L (ref 98–111)
Creatinine, Ser: 0.56 mg/dL (ref 0.44–1.00)
GFR, Estimated: >60 mL/min (ref >60)
Glucose, Bld: 73 mg/dL (ref 70–99)
Potassium: 3.8 mmol/L (ref 3.5–5.1)
Sodium: 132 mmol/L — ABNORMAL LOW (ref 135–145)

## 2020-11-28 LAB — HCG, QUANTITATIVE, PREGNANCY: hCG, Beta Chain, Quant, S: 26356 m[IU]/mL — ABNORMAL HIGH (ref <5)

## 2020-11-28 NOTE — ED Triage Notes (Signed)
Pt c/o vaginal bleeding that started this morning when she woke up and has continued. Pt reports she felt a "gush" in the middle of the night but thought it was just vaginal discharge and didn't worry about it. When she woke up and went to the bathroom she noticed the blood in her underwear and on the toilet. Pt is [redacted] weeks pregnant. Pt is followed by [facility].

## 2020-11-28 NOTE — ED Provider Notes (Signed)
[facility] EMERGENCY DEPARTMENT Provider Note   CSN: [id number] Arrival date & time: 11/28/20  1010     History Chief Complaint  Patient presents with   Vaginal Bleeding    [redacted] weeks pregnant    Ann Moore is a 30 y.o. female.  Patient [redacted] weeks pregnant.  Patient is followed by [facility].  Patient is receiving specialized progesterone shots to help prevent premature delivery which is happened before.  Patient states that this morning she had some vaginal bleeding.  Still some slight oozing.  Patient denies any fever or any nausea or vomiting.  Patient is concerned about possible miscarriage due to the bleeding.  Patient is a gravida 4.  Estimated due date is May 02, 2021.  Heart review shows evidence of recent ultrasound by [facility].  Shows that patient had her last progesterone injection on June 27.  She had an ultrasound done by Dr. Elonda Husky at [facility] on May 27 without any significant findings or abnormalities.  Intrauterine pregnancy with good fetal development.  Placenta in normal position and good amniotic fluid.      Past Medical History:  Diagnosis Date   Abnormal Pap smear of cervix 09/08/2019   08/28/19: ASCUS w/ +HRHPV   Per 2019 ASCCP guidelines, needs colposcopy as soon as possible.  Immediate risk of CIN3+ is 4.4%.    Anxiety    Headache    Missed abortion 03/15/2014   Nexplanon insertion 05/03/2016   Placed 05/03/16    05/09/19 removed   Pregnant 08/20/2015   Rubella non-immune status, antepartum 03/10/2014   MMR pp    Trichomonal vaginitis 04/29/2015   04/29/2015   POC: neg 07/2015 Repeat positive 10/08/15, POC neg 10/26/2015 04/04/16 + again.     UTI (lower urinary tract infection) 08/20/2015   Vaginal discharge 08/20/2015    Patient Active Problem List   Diagnosis Date Noted   Asymptomatic bacteriuria during pregnancy 10/27/2020   Encounter for supervision of normal pregnancy, antepartum 10/21/2020   History of preterm delivery 10/21/2020    Chlamydia 04/05/2020   Adult ADHD 11/06/2019   Generalized headaches 09/25/2019   Nicotine abuse 09/25/2019   Abnormal Pap smear of cervix 09/08/2019   Marijuana use 09/20/2015   Depression with anxiety 12/09/2014   Rubella non-immune status, antepartum 03/10/2014    Past Surgical History:  Procedure Laterality Date   DILATION AND CURETTAGE OF UTERUS  2015     OB History     Gravida  4   Para  2   Term  1   Preterm  1   AB  1   Living  2      SAB  1   IAB  0   Ectopic  0   Multiple  0   Live Births  2           Family History  Problem Relation Age of Onset   Hypertension Mother    Diabetes Sister    Diabetes Maternal Grandfather    Cancer Maternal Grandfather        liver, lung    Social History   Tobacco Use   Smoking status: Never   Smokeless tobacco: Never  Vaping Use   Vaping Use: Former  Substance Use Topics   Alcohol use: No    Alcohol/week: 0.0 standard drinks   Drug use: No    Home Medications Prior to Admission medications   Medication Sig Start Date End Date Taking? Authorizing Provider  acetaminophen (  TYLENOL) 325 MG tablet Take 650 mg by mouth every 6 (six) hours as needed for mild pain, moderate pain or headache.     [provider]  Blood Pressure Monitor MISC For regular home bp monitoring during pregnancy 10/21/20   Roma Schanz, CNM  citalopram (CELEXA) 20 MG tablet Take 55m (1/2 tablet) daily x 1 week, then 242m(1 tablet) daily thereafter 10/21/20   BoRoma SchanzCNM  [facility]  10/26/20   [provider]  metroNIDAZOLE (FLAGYL) 500 MG tablet Take 1 tablet (500 mg total) by mouth 2 (two) times daily. Patient not taking: No sig reported 11/14/20   NeWende MottCNM  Prenatal Vit-Fe Fumarate-FA (PRENATAL VITAMIN PO) Take by mouth.    [provider]  promethazine (PHENERGAN) 25 MG tablet Take 0.5-1 tablets (12.5-25 mg total) by mouth every 6 (six) hours as needed for  nausea or vomiting. 10/01/20   EuFlorian BuffMD    Allergies    Hydrocodone  Review of Systems   Review of Systems  Constitutional:  Negative for chills and fever.  HENT:  Negative for ear pain and sore throat.   Eyes:  Negative for pain and visual disturbance.  Respiratory:  Negative for cough and shortness of breath.   Cardiovascular:  Negative for chest pain and palpitations.  Gastrointestinal:  Negative for abdominal pain and vomiting.  Genitourinary:  Positive for vaginal bleeding. Negative for dysuria, hematuria and vaginal discharge.  Musculoskeletal:  Negative for arthralgias and back pain.  Skin:  Negative for color change and rash.  Neurological:  Negative for seizures and syncope.  All other systems reviewed and are negative.  Physical Exam Updated Vital Signs BP 112/72   Pulse 90   Temp 98.6 F (37 C) (Oral)   Resp 16   Ht 1.626 m (_0 )   Wt 73.5 kg   LMP 07/26/2020 (Exact Date)   SpO2 100%   BMI 27.81 kg/m   Physical Exam Vitals and nursing note reviewed.  Constitutional:      General: She is not in acute distress.    Appearance: She is well-developed.  HENT:     Head: Normocephalic and atraumatic.  Eyes:     Extraocular Movements: Extraocular movements intact.     Conjunctiva/sclera: Conjunctivae normal.     Pupils: Pupils are equal, round, and reactive to light.  Cardiovascular:     Rate and Rhythm: Normal rate and regular rhythm.     Heart sounds: No murmur heard. Pulmonary:     Effort: Pulmonary effort is normal. No respiratory distress.     Breath sounds: Normal breath sounds.  Abdominal:     General: There is no distension.     Palpations: Abdomen is soft.     Tenderness: There is no abdominal tenderness.  Musculoskeletal:        General: Normal range of motion.     Cervical back: Neck supple.  Skin:    General: Skin is warm and dry.  Neurological:     General: No focal deficit present.     Mental Status: She is alert and oriented to  person, place, and time.    ED Results / Procedures / Treatments   Labs (all labs ordered are listed, but only abnormal results are displayed) Labs Reviewed  CBC - Abnormal; Notable for the following components:      Result Value   RBC 3.49 (*)    Hemoglobin 10.7 (*)    HCT 31.8 (*)  All other components within normal limits  BASIC METABOLIC PANEL - Abnormal; Notable for the following components:   Sodium 132 (*)    Calcium 8.1 (*)    All other components within normal limits  HCG, QUANTITATIVE, PREGNANCY - Abnormal; Notable for the following components:   hCG, Beta Chain, Quant, S 26,356 (*)    All other components within normal limits    EKG None  Radiology No results found.  Procedures Procedures   Medications Ordered in ED Medications - No data to display  ED Course  I have reviewed the triage vital signs and the nursing notes.  Pertinent labs & imaging results that were available during my care of the patient were reviewed by me and considered in my medical decision making (see chart for details).    MDM Rules/Calculators/A&P                          Patient hemoglobin here 10.7.  Electrolytes without significant abnormality other than mild hyponatremia with a sodium of 132.  Quantitative hCG here is 26,356.  More importantly patient had good fetal heart tones here at a rate of 156.  Patient with ABO-Rh recorded in 2017 to be O+.  Patient without any significant bleeding here.  Will have patient follow-up with [facility] for repeat quantitative hCG.  Patient with documented intrauterine pregnancy.  Patient is Rh+.  Patient counseled that the bleeding could result in possible miscarriage.  But with a good strong fetal heart tone of 156 things look good today.  Things could change in the future however.  But hopefully not.   Final Clinical Impression(s) / ED Diagnoses Final diagnoses:  Vaginal bleeding in pregnancy    Rx / DC Orders ED Discharge  Orders     None        Fredia Sorrow, MD 11/28/20 1713

## 2020-11-28 NOTE — Discharge Instructions (Signed)
Call [facility] on Tuesday.  For follow-up.  They will be able to compare her new quantitative hCG to today's.  Return for any new or worse symptoms.

## 2020-11-30 ENCOUNTER — Telehealth: Payer: Self-pay | Admitting: *Deleted

## 2020-11-30 ENCOUNTER — Ambulatory Visit (INDEPENDENT_AMBULATORY_CARE_PROVIDER_SITE_OTHER): Payer: Medicaid Other

## 2020-11-30 ENCOUNTER — Other Ambulatory Visit: Payer: Self-pay

## 2020-11-30 DIAGNOSIS — O09892 Supervision of other high risk pregnancies, second trimester: Secondary | ICD-10-CM

## 2020-11-30 DIAGNOSIS — Z3A18 18 weeks gestation of pregnancy: Secondary | ICD-10-CM | POA: Diagnosis not present

## 2020-11-30 DIAGNOSIS — Z8751 Personal history of pre-term labor: Secondary | ICD-10-CM

## 2020-11-30 DIAGNOSIS — Z3480 Encounter for supervision of other normal pregnancy, unspecified trimester: Secondary | ICD-10-CM | POA: Diagnosis not present

## 2020-11-30 NOTE — Progress Notes (Signed)
   NURSE VISIT- INJECTION  SUBJECTIVE:  Ann Moore is a 30 y.o. [phone] female here for a Makena for history of preterm birth. She is [redacted]w[redacted]d pregnant.   OBJECTIVE:  LMP 07/26/2020 (Exact Date)   Appears well, in no apparent distress  Injection administered in: Right arm  No orders of the defined types were placed in this encounter.   ASSESSMENT: Pregnancy [redacted]w[redacted]d Makena for history of preterm birth PLAN: Follow-up: in 1 week for next Makena   Tomeko Scoville A Harlee Eckroth  11/30/2020 3:27 PM

## 2020-11-30 NOTE — Telephone Encounter (Signed)
Transition Care Management Follow-up Telephone Call Date of discharge and from where: 11/28/2020 - [facility] How have you been since you were released from the hospital? "Doing better today" Any questions or concerns? No  Items Reviewed: Did the pt receive and understand the discharge instructions provided? Yes  Medications obtained and verified?  N/A Other? No  Any new allergies since your discharge? No  Dietary orders reviewed? No Do you have support at home? Yes    Functional Questionnaire: (I = Independent and D = Dependent) ADLs: I  Bathing/Dressing- I  Meal Prep- I  Eating- I  Maintaining continence- I  Transferring/Ambulation- I  Managing Meds- I  Follow up appointments reviewed:  PCP Hospital f/u appt confirmed? No   Specialist Hospital f/u appt confirmed? Yes  Scheduled to see OBGYN on 12/09/2020 @ 0910. Are transportation arrangements needed? No  If their condition worsens, is the pt aware to call PCP or go to the Emergency Dept.? Yes Was the patient provided with contact information for the PCP's office or ED? Yes Was to pt encouraged to call back with questions or concerns? Yes

## 2020-12-06 ENCOUNTER — Ambulatory Visit: Payer: Medicaid Other

## 2020-12-06 ENCOUNTER — Other Ambulatory Visit: Payer: Self-pay

## 2020-12-09 ENCOUNTER — Telehealth: Payer: Self-pay | Admitting: Advanced Practice Midwife

## 2020-12-09 ENCOUNTER — Encounter: Payer: Medicaid Other | Admitting: Advanced Practice Midwife

## 2020-12-09 NOTE — Telephone Encounter (Signed)
Spoke with pt, rescheduled OB visit & pt will keep inj appt for 7/18

## 2020-12-09 NOTE — Telephone Encounter (Signed)
Pt had to cancel LROB & 17P today due to family emergency Pt is scheduled for 17P on Monday  Please advise on reschedule injection for this week & OB visit (schedule is full for several days)   Please advise & call pt

## 2020-12-13 ENCOUNTER — Ambulatory Visit (INDEPENDENT_AMBULATORY_CARE_PROVIDER_SITE_OTHER): Payer: Medicaid Other | Admitting: *Deleted

## 2020-12-13 ENCOUNTER — Other Ambulatory Visit: Payer: Self-pay

## 2020-12-13 DIAGNOSIS — Z3480 Encounter for supervision of other normal pregnancy, unspecified trimester: Secondary | ICD-10-CM | POA: Diagnosis not present

## 2020-12-13 DIAGNOSIS — Z8751 Personal history of pre-term labor: Secondary | ICD-10-CM | POA: Diagnosis not present

## 2020-12-13 NOTE — Progress Notes (Addendum)
   NURSE VISIT- INJECTION  SUBJECTIVE:  Ann Moore is a 30 y.o. [phone] female here for a Makena for history of preterm birth. She is [redacted]w[redacted]d pregnant.   OBJECTIVE:  BP 121/72   Pulse 84   LMP 07/26/2020 (Exact Date)   Appears well, in no apparent distress  Injection administered in: Left arm  No orders of the defined types were placed in this encounter.   ASSESSMENT: Pregnancy [redacted]w[redacted]d Makena for history of preterm birth PLAN: Follow-up: in 1 week for next Makena   Annamarie Dawley  12/13/2020 10:08 AM   Attestation of Attending Supervision of Nursing Visit Encounter: Evaluation and management procedures were performed by the nursing staff under my supervision and collaboration.  I have reviewed the nurse's note and chart, and I agree with the management and plan.  Rockne Coons MD Attending Physician for the [facility] 12/13/2020 10:37 AM

## 2020-12-20 ENCOUNTER — Ambulatory Visit: Payer: Medicaid Other

## 2020-12-22 ENCOUNTER — Ambulatory Visit (INDEPENDENT_AMBULATORY_CARE_PROVIDER_SITE_OTHER): Payer: Medicaid Other | Admitting: Advanced Practice Midwife

## 2020-12-22 ENCOUNTER — Encounter: Payer: Self-pay | Admitting: Advanced Practice Midwife

## 2020-12-22 ENCOUNTER — Other Ambulatory Visit: Payer: Self-pay

## 2020-12-22 VITALS — BP 126/89 | HR 88 | Wt 174.0 lb

## 2020-12-22 DIAGNOSIS — Z3480 Encounter for supervision of other normal pregnancy, unspecified trimester: Secondary | ICD-10-CM | POA: Diagnosis not present

## 2020-12-22 DIAGNOSIS — F418 Other specified anxiety disorders: Secondary | ICD-10-CM

## 2020-12-22 DIAGNOSIS — R8271 Bacteriuria: Secondary | ICD-10-CM

## 2020-12-22 DIAGNOSIS — Z363 Encounter for antenatal screening for malformations: Secondary | ICD-10-CM

## 2020-12-22 DIAGNOSIS — R87610 Atypical squamous cells of undetermined significance on cytologic smear of cervix (ASC-US): Secondary | ICD-10-CM

## 2020-12-22 DIAGNOSIS — Z8751 Personal history of pre-term labor: Secondary | ICD-10-CM | POA: Diagnosis not present

## 2020-12-22 DIAGNOSIS — O99891 Other specified diseases and conditions complicating pregnancy: Secondary | ICD-10-CM

## 2020-12-22 DIAGNOSIS — Z302 Encounter for sterilization: Secondary | ICD-10-CM | POA: Insufficient documentation

## 2020-12-22 NOTE — Progress Notes (Signed)
   LOW-RISK PREGNANCY VISIT Patient name: Ann Moore MRN [id number]  Date of birth: [birth year] Chief Complaint:   Routine Prenatal Visit  History of Present Illness:   Ann Moore is a 30 y.o. [id number] female at [redacted]w[redacted]d with an Estimated Date of Delivery: 05/02/21 being seen today for ongoing management of a low-risk pregnancy.  Today she reports  stopping Celexa approx 1-2wks ago; this past week had more anxiety episodes whereas prior it was 1-2x/wk; still seeing therapist . Contractions: Not present. Vag. Bleeding: None.  Movement: Present. denies leaking of fluid. Review of Systems:   Pertinent items are noted in HPI Denies abnormal vaginal discharge w/ itching/odor/irritation, headaches, visual changes, shortness of breath, chest pain, abdominal pain, severe nausea/vomiting, or problems with urination or bowel movements unless otherwise stated above. Pertinent History Reviewed:  Reviewed past medical,surgical, social, obstetrical and family history.  Reviewed problem list, medications and allergies. Physical Assessment:   Vitals:   12/22/20 0844  BP: 126/89  Pulse: 88  Weight: 174 lb (78.9 kg)  Body mass index is 29.87 kg/m.        Physical Examination:   General appearance: Well appearing, and in no distress  Mental status: Alert, oriented to person, place, and time  Skin: Warm & dry  Cardiovascular: Normal heart rate noted  Respiratory: Normal respiratory effort, no distress  Abdomen: Soft, gravid, nontender  Pelvic: Cervical exam deferred         Extremities: Edema: None  Fetal Status: Fetal Heart Rate (bpm): 156   Movement: Present    No results found for this or any previous visit (from the past 24 hour(s)).  Assessment & Plan:  1) Low-risk pregnancy [id number] at [redacted]w[redacted]d with an Estimated Date of Delivery: 05/02/21   2) Hx PTD, getting 17-P injections weekly; no s/s PTL  3) Wants ppBTL, will sign papers ~ 28wks  4) Anxiety, stopped Celexa 1-2wks ago because it  wasn't helping; anxiety episodes increased this past week; will continue to track and may start meds back; getting therapy   Meds: No orders of the defined types were placed in this encounter.  Labs/procedures today: none  Plan:  Continue routine obstetrical care with first avail anatomy u/s  Reviewed: Preterm labor symptoms and general obstetric precautions including but not limited to vaginal bleeding, contractions, leaking of fluid and fetal movement were reviewed in detail with the patient.  All questions were answered. Has home bp cuff. Check bp weekly, let us know if >140/90.   Follow-up: Return for anatomy u/s first avail; LROB and PN2 5wks.  Orders Placed This Encounter  Procedures   US OB Comp + 14 Wk   Arabella Merles [facility] 12/22/2020 9:08 AM

## 2020-12-22 NOTE — Patient Instructions (Signed)
Ann Moore, I greatly value your feedback.  If you receive a survey following your visit with [place] today, we appreciate you taking the time to fill it out.  Thanks, Philipp Deputy, CNM   You will have your sugar test next visit.  Please do not eat or drink anything after midnight the night before you come, not even water.  You will be here for at least two hours.  Please make an appointment online for the bloodwork at [email or web address] for 8:30am (or as close to this as possible). Make sure you select the [facility] service center. The day of the appointment, check in with our office first, then you will go to [facility] to start the sugar test.    [facility] HAS MOVED!!! It is now [facility] ([address] [place], [place] [address]) Entrance C, located off of [address] valet parking  Go to [email or web address] to register for FREE online childbirth classes   Call the office [phone]) or go to [facility] if: You begin to have strong, frequent contractions Your water breaks.  Sometimes it is a big gush of fluid, sometimes it is just a trickle that keeps getting your panties wet or running down your legs You have vaginal bleeding.  It is normal to have a small amount of spotting if your cervix was checked.  You don't feel your baby moving like normal.  If you don't, get you something to eat and drink and lay down and focus on feeling your baby move.   If your baby is still not moving like normal, you should call the office or go to [facility].  [place] Pediatricians/Family Doctors: [facility] [phone]           [facility] [phone]                [facility] [phone] (usually not accepting new patients unless you have family there already, you are always welcome to call and ask)      [facility] Department [phone]       [facility] Pediatricians/Family Doctors:  [facility]: [phone] [facility]: [phone] [facility]: [phone]  [facility] Doctors:  [facility]: [phone]  [facility]: [phone]  [facility] Doctors: [facility]: [phone]   Home Blood Pressure Monitoring for Patients   Your provider has recommended that you check your blood pressure (BP) at least once a week at home. If you do not have a blood pressure cuff at home, one will be provided for you. Contact your provider if you have not received your monitor within 1 week.   Helpful Tips for Accurate Home Blood Pressure Checks  Don't smoke, exercise, or drink caffeine 30 minutes before checking your BP Use the restroom before checking your BP (a full bladder can raise your pressure) Relax in a comfortable upright chair Feet on the ground Left arm resting comfortably on a flat surface at the level of your heart Legs uncrossed Back supported Sit quietly and don't talk Place the cuff on your bare arm Adjust snuggly, so that only two fingertips can fit between your skin and the top of the cuff Check 2 readings separated by at least one minute Keep a log of your BP readings For a visual, please reference this diagram: http://ccnc.care/bpdiagram  Provider Name: [facility]     Phone: [phone]  Zone 1: ALL CLEAR  Continue to monitor your symptoms:  BP reading is less than 140 (top number) or less than 90 (bottom number)  No right upper stomach pain No headaches or seeing spots No feeling nauseated or throwing up No swelling in face and hands  Zone 2: CAUTION Call your doctor's office for any of the following:  BP reading is greater than 140 (top number) or greater than 90 (bottom number)  Stomach pain under your ribs in the middle or right side Headaches or seeing spots Feeling nauseated or throwing up Swelling in face and hands  Zone 3: EMERGENCY   Seek immediate medical care if you have any of the following:  BP reading is greater than160 (top number) or greater than 110 (bottom number) Severe headaches not improving with Tylenol Serious difficulty catching your breath Any worsening symptoms from Zone 2   Second Trimester of Pregnancy The second trimester is from week 13 through week 28, months 4 through 6. The second trimester is often a time when you feel your best. Your body has also adjusted to being pregnant, and you begin to feel better physically. Usually, morning sickness has lessened or quit completely, you may have more energy, and you may have an increase in appetite. The second trimester is also a time when the fetus is growing rapidly. At the end of the sixth month, the fetus is about 9 inches long and weighs about 1 pounds. You will likely begin to feel the baby move (quickening) between 18 and 20 weeks of the pregnancy. BODY CHANGES Your body goes through many changes during pregnancy. The changes vary from woman to woman.  Your weight will continue to increase. You will notice your lower abdomen bulging out. You may begin to get stretch marks on your hips, abdomen, and breasts. You may develop headaches that can be relieved by medicines approved by your health care provider. You may urinate more often because the fetus is pressing on your bladder. You may develop or continue to have heartburn as a result of your pregnancy. You may develop constipation because certain hormones are causing the muscles that push waste through your intestines to slow down. You may develop hemorrhoids or swollen, bulging veins (varicose veins). You may have back pain because of the weight gain and pregnancy hormones relaxing your joints between the bones in your pelvis and as a result of a shift in weight and the muscles that support your balance. Your breasts will continue to grow and be tender. Your gums may bleed and may be sensitive to  brushing and flossing. Dark spots or blotches (chloasma, mask of pregnancy) may develop on your face. This will likely fade after the baby is born. A dark line from your belly button to the pubic area (linea nigra) may appear. This will likely fade after the baby is born. You may have changes in your hair. These can include thickening of your hair, rapid growth, and changes in texture. Some women also have hair loss during or after pregnancy, or hair that feels dry or thin. Your hair will most likely return to normal after your baby is born. WHAT TO EXPECT AT YOUR PRENATAL VISITS During a routine prenatal visit: You will be weighed to make sure you and the fetus are growing normally. Your blood pressure will be taken. Your abdomen will be measured to track your baby's growth. The fetal heartbeat will be listened to. Any test results from the previous visit will be discussed. Your health care provider  may ask you: How you are feeling. If you are feeling the baby move. If you have had any abnormal symptoms, such as leaking fluid, bleeding, severe headaches, or abdominal cramping. If you have any questions. Other tests that may be performed during your second trimester include: Blood tests that check for: Low iron levels (anemia). Gestational diabetes (between 24 and 28 weeks). Rh antibodies. Urine tests to check for infections, diabetes, or protein in the urine. An ultrasound to confirm the proper growth and development of the baby. An amniocentesis to check for possible genetic problems. Fetal screens for spina bifida and Down syndrome. HOME CARE INSTRUCTIONS  Avoid all smoking, herbs, alcohol, and unprescribed drugs. These chemicals affect the formation and growth of the baby. Follow your health care provider's instructions regarding medicine use. There are medicines that are either safe or unsafe to take during pregnancy. Exercise only as directed by your health care provider.  Experiencing uterine cramps is a good sign to stop exercising. Continue to eat regular, healthy meals. Wear a good support bra for breast tenderness. Do not use hot tubs, steam rooms, or saunas. Wear your seat belt at all times when driving. Avoid raw meat, uncooked cheese, cat litter boxes, and soil used by cats. These carry germs that can cause birth defects in the baby. Take your prenatal vitamins. Try taking a stool softener (if your health care provider approves) if you develop constipation. Eat more high-fiber foods, such as fresh vegetables or fruit and whole grains. Drink plenty of fluids to keep your urine clear or pale yellow. Take warm sitz baths to soothe any pain or discomfort caused by hemorrhoids. Use hemorrhoid cream if your health care provider approves. If you develop varicose veins, wear support hose. Elevate your feet for 15 minutes, 3-4 times a day. Limit salt in your diet. Avoid heavy lifting, wear low heel shoes, and practice good posture. Rest with your legs elevated if you have leg cramps or low back pain. Visit your dentist if you have not gone yet during your pregnancy. Use a soft toothbrush to brush your teeth and be gentle when you floss. A sexual relationship may be continued unless your health care provider directs you otherwise. Continue to go to all your prenatal visits as directed by your health care provider. SEEK MEDICAL CARE IF:  You have dizziness. You have mild pelvic cramps, pelvic pressure, or nagging pain in the abdominal area. You have persistent nausea, vomiting, or diarrhea. You have a bad smelling vaginal discharge. You have pain with urination. SEEK IMMEDIATE MEDICAL CARE IF:  You have a fever. You are leaking fluid from your vagina. You have spotting or bleeding from your vagina. You have severe abdominal cramping or pain. You have rapid weight gain or loss. You have shortness of breath with chest pain. You notice sudden or extreme swelling  of your face, hands, ankles, feet, or legs. You have not felt your baby move in over an hour. You have severe headaches that do not go away with medicine. You have vision changes. Document Released: 05/09/2001 Document Revised: 05/20/2013 Document Reviewed: 07/16/2012 [facility] Patient Information 2015 [place], [place]. This information is not intended to replace advice given to you by your health care provider. Make sure you discuss any questions you have with your health care provider.

## 2020-12-27 ENCOUNTER — Ambulatory Visit: Payer: Medicaid Other

## 2020-12-28 ENCOUNTER — Other Ambulatory Visit: Payer: Self-pay

## 2020-12-28 ENCOUNTER — Ambulatory Visit (INDEPENDENT_AMBULATORY_CARE_PROVIDER_SITE_OTHER): Payer: Medicaid Other | Admitting: *Deleted

## 2020-12-28 ENCOUNTER — Ambulatory Visit (INDEPENDENT_AMBULATORY_CARE_PROVIDER_SITE_OTHER): Payer: Medicaid Other

## 2020-12-28 ENCOUNTER — Encounter: Payer: Self-pay | Admitting: *Deleted

## 2020-12-28 VITALS — BP 122/84 | HR 105 | Ht 64.0 in | Wt 175.5 lb

## 2020-12-28 DIAGNOSIS — Z3A22 22 weeks gestation of pregnancy: Secondary | ICD-10-CM

## 2020-12-28 DIAGNOSIS — Z8751 Personal history of pre-term labor: Secondary | ICD-10-CM

## 2020-12-28 DIAGNOSIS — O99891 Other specified diseases and conditions complicating pregnancy: Secondary | ICD-10-CM | POA: Diagnosis not present

## 2020-12-28 DIAGNOSIS — Z3480 Encounter for supervision of other normal pregnancy, unspecified trimester: Secondary | ICD-10-CM

## 2020-12-28 DIAGNOSIS — R8271 Bacteriuria: Secondary | ICD-10-CM

## 2020-12-28 DIAGNOSIS — Z363 Encounter for antenatal screening for malformations: Secondary | ICD-10-CM

## 2020-12-28 DIAGNOSIS — Z302 Encounter for sterilization: Secondary | ICD-10-CM

## 2020-12-28 NOTE — Progress Notes (Signed)
   NURSE VISIT- INJECTION  SUBJECTIVE:  Ann Moore is a 30 y.o. [phone] female here for a Makena for history of preterm birth. She is [redacted]w[redacted]d pregnant.   OBJECTIVE:  BP 122/84   Pulse (!) 105   Ht 5\' 4"  (1.626 m)   Wt 175 lb 8 oz (79.6 kg)   LMP 07/26/2020 (Exact Date)   BMI 30.12 kg/m   Appears well, in no apparent distress  Injection administered in: Right arm  No orders of the defined types were placed in this encounter.   ASSESSMENT: Pregnancy [redacted]w[redacted]d Makena for history of preterm birth PLAN: Follow-up: in 1 week for next [redacted]w[redacted]d  12/28/2020 11:13 AM

## 2020-12-28 NOTE — Progress Notes (Signed)
[place] 22+1 wks,cephalic,anterior placenta gr 0,normal ovaries,cx 4.4 cm,SVP of fluid 6 cm,mild left renal pelvic dilatation,LK 5 mm,RK 3.6 mm (wnl),fhr 140 bpm,EFW 518 g 66%,anatomy complete

## 2021-01-03 ENCOUNTER — Ambulatory Visit: Payer: Medicaid Other

## 2021-01-05 ENCOUNTER — Ambulatory Visit: Payer: Medicaid Other

## 2021-01-10 ENCOUNTER — Ambulatory Visit: Payer: Medicaid Other

## 2021-01-23 ENCOUNTER — Inpatient Hospital Stay (HOSPITAL_COMMUNITY)
Admission: AD | Admit: 2021-01-23 | Discharge: 2021-01-23 | Disposition: A | Discharge status: Home / Self Care | Payer: Medicaid Other | Attending: Obstetrics & Gynecology | Admitting: Obstetrics & Gynecology

## 2021-01-23 ENCOUNTER — Other Ambulatory Visit: Payer: Self-pay

## 2021-01-23 ENCOUNTER — Encounter (HOSPITAL_COMMUNITY): Payer: Self-pay | Admitting: Obstetrics & Gynecology

## 2021-01-23 DIAGNOSIS — O26892 Other specified pregnancy related conditions, second trimester: Secondary | ICD-10-CM | POA: Diagnosis not present

## 2021-01-23 DIAGNOSIS — O26899 Other specified pregnancy related conditions, unspecified trimester: Secondary | ICD-10-CM

## 2021-01-23 DIAGNOSIS — Z3A25 25 weeks gestation of pregnancy: Secondary | ICD-10-CM | POA: Diagnosis not present

## 2021-01-23 DIAGNOSIS — R109 Unspecified abdominal pain: Secondary | ICD-10-CM | POA: Diagnosis not present

## 2021-01-23 LAB — URINALYSIS, ROUTINE W REFLEX MICROSCOPIC
Bilirubin Urine: NEGATIVE
Glucose, UA: NEGATIVE mg/dL
Hgb urine dipstick: NEGATIVE
Ketones, ur: NEGATIVE mg/dL
Leukocytes,Ua: NEGATIVE
Nitrite: NEGATIVE
Protein, ur: NEGATIVE mg/dL
Specific Gravity, Urine: 1.004 — ABNORMAL LOW (ref 1.005–1.030)
pH: 6 (ref 5.0–8.0)

## 2021-01-23 NOTE — MAU Provider Note (Signed)
History     CSN: [id number]  Arrival date and time: 01/23/21 2120   Event Date/Time   First Provider Initiated Contact with Patient 01/23/21 2152      Chief Complaint  Patient presents with   Contractions   HPI Ann Moore is a 30 y.o. [id number] at 43w6dwho presents with contractions.  Reports episodes of painful contractions throughout the day.  At times has felt as many as 8 per hour but has not felt them every hour.  Reports they stopped once she came to MAU.  States she walked outside a lot yesterday for maternity photo shoot.  Reportedly does not drink much water during the day.  Last intercourse was yesterday.  Reports 3 watery stools earlier today.  Denies leaking fluid, vaginal bleeding, abnormal discharge.  Good fetal movement.  OB History     Gravida  4   Para  2   Term  1   Preterm  1   AB  1   Living  2      SAB  1   IAB  0   Ectopic  0   Multiple  0   Live Births  2           Past Medical History:  Diagnosis Date   Abnormal Pap smear of cervix 09/08/2019   08/28/19: ASCUS w/ +HRHPV   Per 2019 ASCCP guidelines, needs colposcopy as soon as possible.  Immediate risk of CIN3+ is 4.4%.    Anxiety    Headache    Rubella non-immune status, antepartum 03/10/2014   MMR pp    Trichomonal vaginitis 04/29/2015   04/29/2015   POC: neg 07/2015 Repeat positive 10/08/15, POC neg 10/26/2015 04/04/16 + again.      Past Surgical History:  Procedure Laterality Date   DILATION AND CURETTAGE OF UTERUS  2015    Family History  Problem Relation Age of Onset   Hypertension Mother    Diabetes Sister    Diabetes Maternal Grandfather    Cancer Maternal Grandfather        liver, lung    Social History   Tobacco Use   Smoking status: Never   Smokeless tobacco: Never  Vaping Use   Vaping Use: Former  Substance Use Topics   Alcohol use: No    Alcohol/week: 0.0 standard drinks   Drug use: No    Allergies:  Allergies  Allergen Reactions    Hydrocodone Nausea And Vomiting and Rash    Facility-Administered Medications Prior to Admission  Medication Dose Route Frequency Provider Last Rate Last Admin   HYDROXYprogesterone caproate (Makena) autoinjector 275 mg  275 mg Subcutaneous Weekly BRoma Schanz CNM   275 mg at 12/28/20 1106   Medications Prior to Admission  Medication Sig Dispense Refill Last Dose   acetaminophen (TYLENOL) 325 MG tablet Take 650 mg by mouth every 6 (six) hours as needed for mild pain, moderate pain or headache.       Blood Pressure Monitor MISC For regular home bp monitoring during pregnancy 1 each 0    citalopram (CELEXA) 20 MG tablet Take 140m(1/2 tablet) daily x 1 week, then 2058m1 tablet) daily thereafter (Patient not taking: No sig reported) 30 tablet 2    MAKENA autoinjector       Prenatal Vit-Fe Fumarate-FA (PRENATAL VITAMIN PO) Take by mouth.      promethazine (PHENERGAN) 25 MG tablet Take 0.5-1 tablets (12.5-25 mg total) by mouth every 6 (six) hours as needed  for nausea or vomiting. 30 tablet 0     Review of Systems  Constitutional: Negative.   Gastrointestinal:  Positive for abdominal pain, diarrhea and nausea. Negative for anal bleeding, blood in stool, constipation and vomiting.  Genitourinary: Negative.   Physical Exam   Blood pressure 128/86, pulse (!) 110, temperature 98.1 F (36.7 C), temperature source Oral, resp. rate 19, height '5\' 4"'  (1.626 m), weight 85.7 kg, last menstrual period 07/26/2020, SpO2 100 %.  Physical Exam Vitals and nursing note reviewed. Exam conducted with a chaperone present.  Constitutional:      General: She is not in acute distress.    Appearance: Normal appearance. She is not ill-appearing.  HENT:     Head: Normocephalic and atraumatic.  Eyes:     General: No scleral icterus. Pulmonary:     Effort: Pulmonary effort is normal. No respiratory distress.  Abdominal:     Palpations: Abdomen is soft.     Tenderness: There is no abdominal tenderness.      Comments: gravid  Genitourinary:    General: Normal vulva.     Vagina: Normal.     Cervix: Normal.     Comments: Dilation: Closed Effacement (%): Thick Cervical Position: Posterior Station: -3 Exam by:: Jorje Guild NP  Skin:    General: Skin is warm and dry.  Neurological:     Mental Status: She is alert.  Psychiatric:        Mood and Affect: Mood normal.        Behavior: Behavior normal.   NST:  Baseline: 140 bpm, Variability: Good {> 6 bpm), Accelerations: 10x10, and Decelerations: Absent & no contractions  MAU Course  Procedures Results for orders placed or performed during the hospital encounter of 01/23/21 (from the past 24 hour(s))  Urinalysis, Routine w reflex microscopic Urine, Clean Catch     Status: Abnormal   Collection Time: 01/23/21  9:41 PM  Result Value Ref Range   Color, Urine STRAW (A) YELLOW   APPearance HAZY (A) CLEAR   Specific Gravity, Urine 1.004 (L) 1.005 - 1.030   pH 6.0 5.0 - 8.0   Glucose, UA NEGATIVE NEGATIVE mg/dL   Hgb urine dipstick NEGATIVE NEGATIVE   Bilirubin Urine NEGATIVE NEGATIVE   Ketones, ur NEGATIVE NEGATIVE mg/dL   Protein, ur NEGATIVE NEGATIVE mg/dL   Nitrite NEGATIVE NEGATIVE   Leukocytes,Ua NEGATIVE NEGATIVE    MDM Patient reports intermittent contractions throughout the day.  Possibly due to dehydration and diarrhea.  No contractions or pain while in MAU and cervix is closed and thick.  Category 1 tracing.  Unable to collect FFN due to recent intercourse.  Assessment and Plan   1. Abdominal cramping affecting pregnancy   2. [redacted] weeks gestation of pregnancy    -reviewed PTL precautions & reasons to return to MAU -Keep appt with [facility] later this week  Jorje Guild 01/23/2021, 10:36 PM

## 2021-01-23 NOTE — MAU Note (Signed)
..  Ann Moore is a 30 y.o. at [redacted]w[redacted]d here in MAU reporting: Contractions all day that have been sporadic. +FM. Denies vaginal bleeding or leaking of fluid.  Pain score: 10/10 with contractions Vitals:   01/23/21 2128  BP: 128/86  Pulse: (!) 110  Resp: 19  Temp: 98.1 F (36.7 C)  SpO2: 100%     FHT:145 Lab orders placed from triage: UA

## 2021-01-24 ENCOUNTER — Ambulatory Visit: Payer: Medicaid Other

## 2021-01-26 ENCOUNTER — Other Ambulatory Visit: Payer: Medicaid Other

## 2021-01-26 ENCOUNTER — Ambulatory Visit (INDEPENDENT_AMBULATORY_CARE_PROVIDER_SITE_OTHER): Payer: Medicaid Other | Admitting: Women's Health

## 2021-01-26 ENCOUNTER — Encounter: Payer: Self-pay | Admitting: Women's Health

## 2021-01-26 ENCOUNTER — Ambulatory Visit: Payer: Medicaid Other

## 2021-01-26 ENCOUNTER — Other Ambulatory Visit: Payer: Self-pay

## 2021-01-26 VITALS — BP 125/83 | HR 97 | Wt 188.0 lb

## 2021-01-26 DIAGNOSIS — Z8751 Personal history of pre-term labor: Secondary | ICD-10-CM

## 2021-01-26 DIAGNOSIS — Z3A26 26 weeks gestation of pregnancy: Secondary | ICD-10-CM

## 2021-01-26 DIAGNOSIS — Z23 Encounter for immunization: Secondary | ICD-10-CM | POA: Diagnosis not present

## 2021-01-26 DIAGNOSIS — Z3480 Encounter for supervision of other normal pregnancy, unspecified trimester: Secondary | ICD-10-CM | POA: Diagnosis not present

## 2021-01-26 DIAGNOSIS — Z3482 Encounter for supervision of other normal pregnancy, second trimester: Secondary | ICD-10-CM

## 2021-01-26 NOTE — Patient Instructions (Signed)
[place], thank you for choosing our office today! We appreciate the opportunity to meet your healthcare needs. You may receive a short survey by mail, e-mail, or through MyChart. If you are happy with your care we would appreciate if you could take just a few minutes to complete the survey questions. We read all of your comments and take your feedback very seriously. Thank you again for choosing our office.  [facility]  [facility] ([address] [place], [place] [address]) Entrance C, located off of [address] Free 24/7 valet parking   CLASSES: Go to [email or web address] to register for classes (childbirth, breastfeeding, waterbirth, infant CPR, daddy bootcamp, etc.)  Call the office ([phone]) or go to [facility] if: You begin to have strong, frequent contractions Your water breaks.  Sometimes it is a big gush of fluid, sometimes it is just a trickle that keeps getting your panties wet or running down your legs You have vaginal bleeding.  It is normal to have a small amount of spotting if your cervix was checked.  You don't feel your baby moving like normal.  If you don't, get you something to eat and drink and lay down and focus on feeling your baby move.   If your baby is still not moving like normal, you should call the office or go to [facility].  Call the office ([phone]) or go to [facility] for these signs of pre-eclampsia: Severe headache that does not go away with Tylenol Visual changes- seeing spots, double, blurred vision Pain under your right breast or upper abdomen that does not go away with Tums or heartburn medicine Nausea and/or vomiting Severe swelling in your hands, feet, and face   Tdap Vaccine It is recommended that you get the Tdap vaccine during the third trimester of EACH pregnancy to help protect your baby from getting pertussis (whooping cough) 27-36 weeks is the BEST time to do  this so that you can pass the protection on to your baby. During pregnancy is better than after pregnancy, but if you are unable to get it during pregnancy it will be offered at the hospital.  You can get this vaccine with us, at the health department, your family doctor, or some local pharmacies Everyone who will be around your baby should also be up-to-date on their vaccines before the baby comes. Adults (who are not pregnant) only need 1 dose of Tdap during adulthood.   [place] Pediatricians/Family Doctors [facility] ([facility]): [address], [phone]           [facility]: [address], [phone]                [facility] ([facility]): [address], [phone] (call to ask if accepting patients) [facility]: [address], [place], [phone]    [place] Pediatricians/Family Doctors [facility] ([facility]): [address], [phone] [facility]: [address], [phone] [facility]: [address], [phone]  [place] [facility]  [facility] ([facility]): [phone] [facility]: [address], [phone]   [place] Family Doctors [facility]: [address], [phone]  [place] [facility]  [facility]: [address], [phone]  Home Blood Pressure Monitoring for Patients   Your provider has recommended that you check your   blood pressure (BP) at least once a week at home. If you do not have a blood pressure cuff at home, one will be provided for you. Contact your provider if you have not received your monitor within 1 week.   Helpful Tips for Accurate Home Blood Pressure Checks  Don't smoke, exercise, or drink caffeine 30 minutes before checking your BP Use the restroom before checking your BP (a full bladder can raise your  pressure) Relax in a comfortable upright chair Feet on the ground Left arm resting comfortably on a flat surface at the level of your heart Legs uncrossed Back supported Sit quietly and don't talk Place the cuff on your bare arm Adjust snuggly, so that only two fingertips can fit between your skin and the top of the cuff Check 2 readings separated by at least one minute Keep a log of your BP readings For a visual, please reference this diagram: http://ccnc.care/bpdiagram  Provider Name: [facility]     Phone: [phone]  Zone 1: ALL CLEAR  Continue to monitor your symptoms:  BP reading is less than 140 (top number) or less than 90 (bottom number)  No right upper stomach pain No headaches or seeing spots No feeling nauseated or throwing up No swelling in face and hands  Zone 2: CAUTION Call your doctor's office for any of the following:  BP reading is greater than 140 (top number) or greater than 90 (bottom number)  Stomach pain under your ribs in the middle or right side Headaches or seeing spots Feeling nauseated or throwing up Swelling in face and hands  Zone 3: EMERGENCY  Seek immediate medical care if you have any of the following:  BP reading is greater than160 (top number) or greater than 110 (bottom number) Severe headaches not improving with Tylenol Serious difficulty catching your breath Any worsening symptoms from Zone 2   Third Trimester of Pregnancy The third trimester is from week 29 through week 42, months 7 through 9. The third trimester is a time when the fetus is growing rapidly. At the end of the ninth month, the fetus is about 20 inches in length and weighs 6-10 pounds.  BODY CHANGES Your body goes through many changes during pregnancy. The changes vary from woman to woman.  Your weight will continue to increase. You can expect to gain 25-35 pounds (11-16 kg) by the end of the pregnancy. You may begin to get stretch marks on your hips, abdomen,  and breasts. You may urinate more often because the fetus is moving lower into your pelvis and pressing on your bladder. You may develop or continue to have heartburn as a result of your pregnancy. You may develop constipation because certain hormones are causing the muscles that push waste through your intestines to slow down. You may develop hemorrhoids or swollen, bulging veins (varicose veins). You may have pelvic pain because of the weight gain and pregnancy hormones relaxing your joints between the bones in your pelvis. Backaches may result from overexertion of the muscles supporting your posture. You may have changes in your hair. These can include thickening of your hair, rapid growth, and changes in texture. Some women also have hair loss during or after pregnancy, or hair that feels dry or thin. Your hair will most likely return to normal after your baby is born. Your breasts will continue to grow and be tender. A yellow discharge may leak from your breasts called colostrum. Your belly button may stick out. You may   feel short of breath because of your expanding uterus. You may notice the fetus "dropping," or moving lower in your abdomen. You may have a bloody mucus discharge. This usually occurs a few days to a week before labor begins. Your cervix becomes thin and soft (effaced) near your due date. WHAT TO EXPECT AT YOUR PRENATAL EXAMS  You will have prenatal exams every 2 weeks until week 36. Then, you will have weekly prenatal exams. During a routine prenatal visit: You will be weighed to make sure you and the fetus are growing normally. Your blood pressure is taken. Your abdomen will be measured to track your baby's growth. The fetal heartbeat will be listened to. Any test results from the previous visit will be discussed. You may have a cervical check near your due date to see if you have effaced. At around 36 weeks, your caregiver will check your cervix. At the same time, your  caregiver will also perform a test on the secretions of the vaginal tissue. This test is to determine if a type of bacteria, Group B streptococcus, is present. Your caregiver will explain this further. Your caregiver may ask you: What your birth plan is. How you are feeling. If you are feeling the baby move. If you have had any abnormal symptoms, such as leaking fluid, bleeding, severe headaches, or abdominal cramping. If you have any questions. Other tests or screenings that may be performed during your third trimester include: Blood tests that check for low iron levels (anemia). Fetal testing to check the health, activity level, and growth of the fetus. Testing is done if you have certain medical conditions or if there are problems during the pregnancy. FALSE LABOR You may feel small, irregular contractions that eventually go away. These are called Braxton Hicks contractions, or false labor. Contractions may last for hours, days, or even weeks before true labor sets in. If contractions come at regular intervals, intensify, or become painful, it is best to be seen by your caregiver.  SIGNS OF LABOR  Menstrual-like cramps. Contractions that are 5 minutes apart or less. Contractions that start on the top of the uterus and spread down to the lower abdomen and back. A sense of increased pelvic pressure or back pain. A watery or bloody mucus discharge that comes from the vagina. If you have any of these signs before the 37th week of pregnancy, call your caregiver right away. You need to go to the hospital to get checked immediately. HOME CARE INSTRUCTIONS  Avoid all smoking, herbs, alcohol, and unprescribed drugs. These chemicals affect the formation and growth of the baby. Follow your caregiver's instructions regarding medicine use. There are medicines that are either safe or unsafe to take during pregnancy. Exercise only as directed by your caregiver. Experiencing uterine cramps is a good sign to  stop exercising. Continue to eat regular, healthy meals. Wear a good support bra for breast tenderness. Do not use hot tubs, steam rooms, or saunas. Wear your seat belt at all times when driving. Avoid raw meat, uncooked cheese, cat litter boxes, and soil used by cats. These carry germs that can cause birth defects in the baby. Take your prenatal vitamins. Try taking a stool softener (if your caregiver approves) if you develop constipation. Eat more high-fiber foods, such as fresh vegetables or fruit and whole grains. Drink plenty of fluids to keep your urine clear or pale yellow. Take warm sitz baths to soothe any pain or discomfort caused by hemorrhoids. Use hemorrhoid cream if   your caregiver approves. If you develop varicose veins, wear support hose. Elevate your feet for 15 minutes, 3-4 times a day. Limit salt in your diet. Avoid heavy lifting, wear low heal shoes, and practice good posture. Rest a lot with your legs elevated if you have leg cramps or low back pain. Visit your dentist if you have not gone during your pregnancy. Use a soft toothbrush to brush your teeth and be gentle when you floss. A sexual relationship may be continued unless your caregiver directs you otherwise. Do not travel far distances unless it is absolutely necessary and only with the approval of your caregiver. Take prenatal classes to understand, practice, and ask questions about the labor and delivery. Make a trial run to the hospital. Pack your hospital bag. Prepare the baby's nursery. Continue to go to all your prenatal visits as directed by your caregiver. SEEK MEDICAL CARE IF: You are unsure if you are in labor or if your water has broken. You have dizziness. You have mild pelvic cramps, pelvic pressure, or nagging pain in your abdominal area. You have persistent nausea, vomiting, or diarrhea. You have a bad smelling vaginal discharge. You have pain with urination. SEEK IMMEDIATE MEDICAL CARE IF:  You  have a fever. You are leaking fluid from your vagina. You have spotting or bleeding from your vagina. You have severe abdominal cramping or pain. You have rapid weight loss or gain. You have shortness of breath with chest pain. You notice sudden or extreme swelling of your face, hands, ankles, feet, or legs. You have not felt your baby move in over an hour. You have severe headaches that do not go away with medicine. You have vision changes. Document Released: 05/09/2001 Document Revised: 05/20/2013 Document Reviewed: 07/16/2012 ExitCare Patient Information 2015 [employer]. This information is not intended to replace advice given to you by your health care provider. Make sure you discuss any questions you have with your health care provider.       

## 2021-01-26 NOTE — Progress Notes (Signed)
LOW-RISK PREGNANCY VISIT Patient name: Ann Moore MRN [id number]  Date of birth: 09-14-1990 Chief Complaint:   Routine Prenatal Visit  History of Present Illness:   Ann Moore is a 30 y.o. [id number] female at [redacted]w[redacted]d with an Estimated Date of Delivery: 05/02/21 being seen today for ongoing management of a low-risk pregnancy.   Today she reports no complaints. Wants BTL.  Contractions: Not present. Vag. Bleeding: None.  Movement: Present. denies leaking of fluid.  Depression screen [facility] 2/9 01/26/2021 10/21/2020 02/03/2020 01/28/2020 12/23/2019  Decreased Interest 0 1 1 1 2   Down, Depressed, Hopeless 0 0 0 2 0  PHQ - 2 Score 0 1 1 3 2   Altered sleeping 1 1 1 2 3   Tired, decreased energy 1 1 1 3 3   Change in appetite 0 0 0 1 3  Feeling bad or failure about yourself  0 0 0 1 1  Trouble concentrating 0 1 0 3 2  Moving slowly or fidgety/restless 0 0 1 3 3   Suicidal thoughts 0 0 0 0 0  PHQ-9 Score 2 4 4 16 17   Difficult doing work/chores - - Somewhat difficult Very difficult Very difficult  Some recent data might be hidden     GAD 7 : Generalized Anxiety Score 01/26/2021 10/21/2020 02/03/2020 01/28/2020  Nervous, Anxious, on Edge 1 1 0 3  Control/stop worrying 1 1 1 3   Worry too much - different things 0 1 1 3   Trouble relaxing 1 1 1 3   Restless 0 0 1 3  Easily annoyed or irritable 1 1 0 3  Afraid - awful might happen 0 0 0 3  Total GAD 7 Score 4 5 4 21   Anxiety Difficulty - - Somewhat difficult Very difficult      Review of Systems:   Pertinent items are noted in HPI Denies abnormal vaginal discharge w/ itching/odor/irritation, headaches, visual changes, shortness of breath, chest pain, abdominal pain, severe nausea/vomiting, or problems with urination or bowel movements unless otherwise stated above. Pertinent History Reviewed:  Reviewed past medical,surgical, social, obstetrical and family history.  Reviewed problem list, medications and allergies. Physical Assessment:    Vitals:   01/26/21 0916  BP: 125/83  Pulse: 97  Weight: 188 lb (85.3 kg)  Body mass index is 32.27 kg/m.        Physical Examination:   General appearance: Well appearing, and in no distress  Mental status: Alert, oriented to person, place, and time  Skin: Warm & dry  Cardiovascular: Normal heart rate noted  Respiratory: Normal respiratory effort, no distress  Abdomen: Soft, gravid, nontender  Pelvic: Cervical exam deferred         Extremities: Edema: None  Fetal Status: Fetal Heart Rate (bpm): 143 Fundal Height: 28 cm Movement: Present    Chaperone: N/A   No results found for this or any previous visit (from the past 24 hour(s)).  Assessment & Plan:  1) Low-risk pregnancy at [redacted]w[redacted]d with an Estimated Date of Delivery: 05/02/21   2) H/O PTB 35wks, weekly Makena  3) Wants BTL>reviewed risks/benefits, LARCs just as effective, consent signed today    Meds: No orders of the defined types were placed in this encounter.  Labs/procedures today: tdap, Makena, and PN2  Plan:  Continue routine obstetrical care  Next visit: prefers in person    Reviewed: Preterm labor symptoms and general obstetric precautions including but not limited to vaginal bleeding, contractions, leaking of fluid and fetal movement were reviewed in detail  with the patient.  All questions were answered.   Follow-up: Return in about 4 weeks (around 02/23/2021) for LROB, CNM, in person, Weekly 17P, Sign BTL consent today.  Future Appointments  Date Time Provider Department Center  01/26/2021  9:50 AM [facility] NURSE [facility]  02/01/2021  9:50 AM [facility]  02/07/2021  9:50 AM [facility]  02/14/2021  9:50 AM [facility]  02/21/2021  9:50 AM [facility]  02/28/2021  9:50 AM [facility]  03/07/2021  9:50 AM [facility]  03/14/2021  9:50 AM [facility]   03/21/2021  9:50 AM [facility]  03/28/2021  9:50 AM [facility]  04/04/2021  9:50 AM [facility]    Orders Placed This Encounter  Procedures   Tdap vaccine greater than or equal to 7yo IM   Cheral Marker CNM, [facility] 01/26/2021 9:41 AM

## 2021-01-27 LAB — ANTIBODY SCREEN: Antibody Screen: NEGATIVE

## 2021-01-27 LAB — RPR: RPR Ser Ql: NONREACTIVE

## 2021-01-27 LAB — CBC
Hematocrit: 32.4 % — ABNORMAL LOW (ref 34.0–46.6)
Hemoglobin: 10.7 g/dL — ABNORMAL LOW (ref 11.1–15.9)
MCH: 29.9 pg (ref 26.6–33.0)
MCHC: 33 g/dL (ref 31.5–35.7)
MCV: 91 fL (ref 79–97)
Platelets: 183 10*3/uL (ref 150–450)
RBC: 3.58 x10E6/uL — ABNORMAL LOW (ref 3.77–5.28)
RDW: 12.3 % (ref 11.7–15.4)
WBC: 7.5 10*3/uL (ref 3.4–10.8)

## 2021-01-27 LAB — GLUCOSE TOLERANCE, 2 HOURS W/ 1HR
Glucose, 1 hour: 85 mg/dL (ref 65–179)
Glucose, 2 hour: 62 mg/dL — ABNORMAL LOW (ref 65–152)
Glucose, Fasting: 77 mg/dL (ref 65–91)

## 2021-01-27 LAB — HIV ANTIBODY (ROUTINE TESTING W REFLEX): HIV Screen 4th Generation wRfx: NONREACTIVE

## 2021-02-01 ENCOUNTER — Ambulatory Visit: Payer: Medicaid Other

## 2021-02-05 ENCOUNTER — Other Ambulatory Visit: Payer: Self-pay

## 2021-02-05 ENCOUNTER — Inpatient Hospital Stay (HOSPITAL_COMMUNITY)
Admission: AD | Admit: 2021-02-05 | Discharge: 2021-02-06 | Disposition: A | Discharge status: Home / Self Care | Payer: Medicaid Other | Attending: Obstetrics & Gynecology | Admitting: Obstetrics & Gynecology

## 2021-02-05 DIAGNOSIS — O368120 Decreased fetal movements, second trimester, not applicable or unspecified: Secondary | ICD-10-CM

## 2021-02-05 DIAGNOSIS — Z3480 Encounter for supervision of other normal pregnancy, unspecified trimester: Secondary | ICD-10-CM

## 2021-02-05 DIAGNOSIS — Z3A27 27 weeks gestation of pregnancy: Secondary | ICD-10-CM

## 2021-02-05 DIAGNOSIS — O4692 Antepartum hemorrhage, unspecified, second trimester: Secondary | ICD-10-CM | POA: Insufficient documentation

## 2021-02-05 DIAGNOSIS — Z302 Encounter for sterilization: Secondary | ICD-10-CM

## 2021-02-05 DIAGNOSIS — Z3689 Encounter for other specified antenatal screening: Secondary | ICD-10-CM

## 2021-02-05 DIAGNOSIS — O99891 Other specified diseases and conditions complicating pregnancy: Secondary | ICD-10-CM

## 2021-02-05 DIAGNOSIS — Z885 Allergy status to narcotic agent status: Secondary | ICD-10-CM | POA: Insufficient documentation

## 2021-02-05 DIAGNOSIS — R109 Unspecified abdominal pain: Secondary | ICD-10-CM | POA: Insufficient documentation

## 2021-02-05 DIAGNOSIS — R8271 Bacteriuria: Secondary | ICD-10-CM

## 2021-02-05 DIAGNOSIS — O26892 Other specified pregnancy related conditions, second trimester: Secondary | ICD-10-CM | POA: Insufficient documentation

## 2021-02-05 DIAGNOSIS — Z87891 Personal history of nicotine dependence: Secondary | ICD-10-CM | POA: Insufficient documentation

## 2021-02-05 DIAGNOSIS — N939 Abnormal uterine and vaginal bleeding, unspecified: Secondary | ICD-10-CM

## 2021-02-05 NOTE — MAU Note (Addendum)
Light bleeding since 7:30 pm.Decreased fetal movement since 1 pm. Cramps since yesterday evening. No leaking.

## 2021-02-06 ENCOUNTER — Encounter (HOSPITAL_COMMUNITY): Payer: Self-pay | Admitting: Obstetrics & Gynecology

## 2021-02-06 DIAGNOSIS — O26892 Other specified pregnancy related conditions, second trimester: Secondary | ICD-10-CM

## 2021-02-06 DIAGNOSIS — Z3A27 27 weeks gestation of pregnancy: Secondary | ICD-10-CM

## 2021-02-06 DIAGNOSIS — O4692 Antepartum hemorrhage, unspecified, second trimester: Secondary | ICD-10-CM | POA: Diagnosis not present

## 2021-02-06 DIAGNOSIS — O368120 Decreased fetal movements, second trimester, not applicable or unspecified: Secondary | ICD-10-CM | POA: Diagnosis not present

## 2021-02-06 DIAGNOSIS — Z885 Allergy status to narcotic agent status: Secondary | ICD-10-CM | POA: Diagnosis not present

## 2021-02-06 DIAGNOSIS — R109 Unspecified abdominal pain: Secondary | ICD-10-CM

## 2021-02-06 DIAGNOSIS — O26852 Spotting complicating pregnancy, second trimester: Secondary | ICD-10-CM | POA: Diagnosis not present

## 2021-02-06 DIAGNOSIS — Z87891 Personal history of nicotine dependence: Secondary | ICD-10-CM | POA: Diagnosis not present

## 2021-02-06 LAB — URINALYSIS, ROUTINE W REFLEX MICROSCOPIC
Bilirubin Urine: NEGATIVE
Glucose, UA: NEGATIVE mg/dL
Hgb urine dipstick: NEGATIVE
Ketones, ur: NEGATIVE mg/dL
Nitrite: NEGATIVE
Protein, ur: NEGATIVE mg/dL
Specific Gravity, Urine: <1.005 — ABNORMAL LOW (ref 1.005–1.030)
pH: 6.5 (ref 5.0–8.0)

## 2021-02-06 LAB — WET PREP, GENITAL
Clue Cells Wet Prep HPF POC: NONE SEEN
Sperm: NONE SEEN
Trich, Wet Prep: NONE SEEN
Yeast Wet Prep HPF POC: NONE SEEN

## 2021-02-06 LAB — URINALYSIS, MICROSCOPIC (REFLEX)

## 2021-02-06 NOTE — MAU Provider Note (Signed)
History     CSN: [id number]  Arrival date and time: 02/05/21 2201   Event Date/Time   First Provider Initiated Contact with Patient 02/06/21 0034      Chief Complaint  Patient presents with   Abdominal Pain    Decreased fetal movement   Vaginal Bleeding   Ann Moore is a 30 y.o. [id number] at 51w6dwho receives care at [facility].  She presents today for DFM, Spotting, and Cramping.  She states she noted one incident of light pink spotting around 1930 and has not had any since.  She reports cramping that has been present since yesterday and is located in the lower abdominal area.  She states the cramping was initially constant, but is now intermittent.  She rates the pain a 3/10.  She reports at its worse it is a 5/10. She has not identified any relieving factors and reports bending over makes the cramping worse.  She also reports she has not felt movement since 1300, but noted movement upon arrival.  She states she this was unusual as fetus as been "extremely active over the past week."     OB History     Gravida  4   Para  2   Term  1   Preterm  1   AB  1   Living  2      SAB  1   IAB  0   Ectopic  0   Multiple  0   Live Births  2           Past Medical History:  Diagnosis Date   Abnormal Pap smear of cervix 09/08/2019   08/28/19: ASCUS w/ +HRHPV   Per 2019 ASCCP guidelines, needs colposcopy as soon as possible.  Immediate risk of CIN3+ is 4.4%.    Anxiety    Headache    Rubella non-immune status, antepartum 03/10/2014   MMR pp    Trichomonal vaginitis 04/29/2015   04/29/2015   POC: neg 07/2015 Repeat positive 10/08/15, POC neg 10/26/2015 04/04/16 + again.      Past Surgical History:  Procedure Laterality Date   DILATION AND CURETTAGE OF UTERUS  2015    Family History  Problem Relation Age of Onset   Hypertension Mother    Diabetes Sister    Diabetes Maternal Grandfather    Cancer Maternal Grandfather        liver, lung    Social History    Tobacco Use   Smoking status: Never   Smokeless tobacco: Never  Vaping Use   Vaping Use: Former  Substance Use Topics   Alcohol use: No    Alcohol/week: 0.0 standard drinks   Drug use: No    Allergies:  Allergies  Allergen Reactions   Hydrocodone Nausea And Vomiting and Rash    Facility-Administered Medications Prior to Admission  Medication Dose Route Frequency Provider Last Rate Last Admin   HYDROXYprogesterone caproate (Makena) autoinjector 275 mg  275 mg Subcutaneous Weekly BRoma Schanz CNM   275 mg at 01/26/21 04854  Medications Prior to Admission  Medication Sig Dispense Refill Last Dose   acetaminophen (TYLENOL) 325 MG tablet Take 650 mg by mouth every 6 (six) hours as needed for mild pain, moderate pain or headache.    02/05/2021   Prenatal Vit-Fe Fumarate-FA (PRENATAL VITAMIN PO) Take by mouth.   02/05/2021   promethazine (PHENERGAN) 25 MG tablet Take 0.5-1 tablets (12.5-25 mg total) by mouth every 6 (six) hours as needed for  nausea or vomiting. 30 tablet 0 Past Month   Blood Pressure Monitor MISC For regular home bp monitoring during pregnancy 1 each 0    MAKENA autoinjector        Review of Systems  Constitutional:  Negative for chills and fever.  Gastrointestinal:  Positive for abdominal pain (Lower abdomen-cramping). Negative for constipation, diarrhea, nausea and vomiting.  Genitourinary:  Positive for vaginal bleeding (Spotting x one). Negative for difficulty urinating, dysuria and vaginal discharge.  Neurological:  Negative for dizziness, light-headedness and headaches.  Physical Exam   Blood pressure 122/80, pulse 92, temperature 98 F (36.7 C), temperature source Oral, resp. rate 18, height '5\' 4"'  (1.626 m), weight 86.6 kg, last menstrual period 07/26/2020, SpO2 98 %.  Physical Exam Vitals reviewed. Exam conducted with a chaperone present.  Constitutional:      Appearance: She is well-developed.  HENT:     Head: Normocephalic and atraumatic.   Eyes:     Conjunctiva/sclera: Conjunctivae normal.  Cardiovascular:     Rate and Rhythm: Normal rate and regular rhythm.     Heart sounds: Normal heart sounds.  Pulmonary:     Effort: Pulmonary effort is normal. No respiratory distress.     Breath sounds: Normal breath sounds.  Abdominal:     Palpations: Abdomen is soft.     Tenderness: There is no abdominal tenderness.  Genitourinary:      Comments: Speculum Exam: -Normal External Genitalia: Non tender, no apparent discharge at introitus.  -Vaginal Vault: Pink mucosa with good rugae. Scant amt thin white discharge -wet prep collected -Cervix:Pink, no lesions, cysts, or polyps.  Appears closed. No active bleeding from os-GC/CT collected -Bimanual Exam:    Dilation: Closed Effacement (%): 0 Cervical Position: Posterior Station: Ballotable Presentation: Undeterminable Exam by:: J.Nathalya Wolanski, CNM   Musculoskeletal:        General: Normal range of motion.     Cervical back: Normal range of motion.  Skin:    General: Skin is warm and dry.  Neurological:     Mental Status: She is alert and oriented to person, place, and time.  Psychiatric:        Mood and Affect: Mood normal.        Behavior: Behavior normal.        Thought Content: Thought content normal.    Fetal Assessment 135 bpm, Mod Var, -Decels, +Accels Toco: No ctx graphed  MAU Course  No results found for this or any previous visit (from the past 24 hour(s)). No results found.  MDM PE Labs: Wet Prep, GC/CT EFM  Assessment and Plan  30 year old [id number]  SIUP at 27.6 weeks Cat I FT Spotting DFM Abdominal Cramping  -POC Reviewed -Patient offered and accepts pelvic exam to attempt to identify cause of spotting. -Agreeable to cultures.  -Exam performed and findings discussed. -Discussed methods to promote fetal movement during times when it is perceived as decreased.  -Encouraged increased hydration.  -Reassured that some cramping is anticipated during  pregnancy.  -Will await results.  [name] MSN, CNM 02/06/2021, 12:34 AM   Reassessment (1:08 AM) -Patient requesting discharge and states she will review results via mychart. -Provider agreeable. -Nurse  to give precautions. -Encouraged to call primary office or return to MAU if symptoms worsen or with the onset of new symptoms. -Discharged to home in stable condition.  [name] MSN, CNM Advanced Practice Provider, [facility]

## 2021-02-07 ENCOUNTER — Ambulatory Visit: Payer: Medicaid Other

## 2021-02-07 LAB — CULTURE, OB URINE: Culture: 30000 — AB

## 2021-02-07 LAB — GC/CHLAMYDIA PROBE AMP (~~LOC~~) NOT AT ARMC
Chlamydia: NEGATIVE
Comment: NEGATIVE
Comment: NORMAL
Neisseria Gonorrhea: NEGATIVE

## 2021-02-08 ENCOUNTER — Other Ambulatory Visit: Payer: Self-pay

## 2021-02-08 ENCOUNTER — Ambulatory Visit (INDEPENDENT_AMBULATORY_CARE_PROVIDER_SITE_OTHER): Payer: Medicaid Other

## 2021-02-08 VITALS — BP 129/77 | HR 94 | Wt 193.0 lb

## 2021-02-08 DIAGNOSIS — Z3480 Encounter for supervision of other normal pregnancy, unspecified trimester: Secondary | ICD-10-CM

## 2021-02-08 DIAGNOSIS — Z8751 Personal history of pre-term labor: Secondary | ICD-10-CM

## 2021-02-08 NOTE — Progress Notes (Signed)
   NURSE VISIT- INJECTION  SUBJECTIVE:  Ann Moore is a 30 y.o. [phone] female here for a Makena for history of preterm birth. She is [redacted]w[redacted]d pregnant.   OBJECTIVE:  BP 129/77   Pulse 94   Wt 193 lb (87.5 kg)   LMP 07/26/2020 (Exact Date)   BMI 33.13 kg/m   Appears well, in no apparent distress  Injection administered in: Right arm  No orders of the defined types were placed in this encounter.   ASSESSMENT: Pregnancy [redacted]w[redacted]d Makena for history of preterm birth PLAN: Follow-up: in 1 week for next Makena   Rosan Calbert A Rily Nickey  02/08/2021 11:16 AM

## 2021-02-14 ENCOUNTER — Ambulatory Visit (INDEPENDENT_AMBULATORY_CARE_PROVIDER_SITE_OTHER): Payer: Medicaid Other

## 2021-02-14 ENCOUNTER — Other Ambulatory Visit: Payer: Self-pay

## 2021-02-14 VITALS — BP 116/77 | HR 89 | Wt 195.6 lb

## 2021-02-14 DIAGNOSIS — Z3480 Encounter for supervision of other normal pregnancy, unspecified trimester: Secondary | ICD-10-CM

## 2021-02-14 DIAGNOSIS — Z8751 Personal history of pre-term labor: Secondary | ICD-10-CM | POA: Diagnosis not present

## 2021-02-14 NOTE — Progress Notes (Addendum)
   NURSE VISIT- INJECTION  SUBJECTIVE:  Ann Moore is a 30 y.o. [phone] female here for a Makena for history of preterm birth. She is [redacted]w[redacted]d pregnant.   OBJECTIVE:  BP 116/77   Pulse 89   Wt 195 lb 9.6 oz (88.7 kg)   LMP 07/26/2020 (Exact Date)   BMI 33.57 kg/m   Appears well, in no apparent distress  Injection administered in: Left arm  No orders of the defined types were placed in this encounter.   ASSESSMENT: Pregnancy [redacted]w[redacted]d Makena for history of preterm birth PLAN: Follow-up: in 1 week for next Makena   Marquavion Venhuizen A Locklyn Henriquez  02/14/2021 10:07 AM  Chart reviewed for nurse visit. Agree with plan of care.  Cheral Marker, [place] 02/14/2021 12:59 PM

## 2021-02-17 ENCOUNTER — Other Ambulatory Visit: Payer: Self-pay

## 2021-02-17 ENCOUNTER — Encounter: Payer: Self-pay | Admitting: Emergency Medicine

## 2021-02-17 ENCOUNTER — Ambulatory Visit
Admission: EM | Admit: 2021-02-17 | Discharge: 2021-02-17 | Disposition: A | Discharge status: Home / Self Care | Payer: Medicaid Other | Attending: Emergency Medicine | Admitting: Emergency Medicine

## 2021-02-17 DIAGNOSIS — L02415 Cutaneous abscess of right lower limb: Secondary | ICD-10-CM

## 2021-02-17 MED ORDER — CEPHALEXIN 500 MG PO CAPS: 500.0000 mg | ORAL_CAPSULE | Freq: Three times a day (TID) | ORAL | 0 refills | Status: AC

## 2021-02-17 MED ORDER — MUPIROCIN 2 % EX OINT: 1.0000 "application " | TOPICAL_OINTMENT | Freq: Two times a day (BID) | CUTANEOUS | 0 refills | Status: DC

## 2021-02-17 NOTE — ED Provider Notes (Addendum)
[place]   [id number] 02/17/21 Arrival Time: 43   XT:[id number]  SUBJECTIVE:  Ann Moore is a 30 y.o. female who presents with a possible abscess of her RT groin x few months with recent flare up.  Denies a precipitating event, attributes symptoms to thighs rubbing together.  Sore to the touch.  Report previous symptoms in the past.  Denies fever, chills, nausea, vomiting  Report redness, swelling and some drainage.    Patient is [redacted] weeks pregnant.  Told by OB to come here for evaluation  ROS: As per HPI.  All other pertinent ROS negative.     Past Medical History:  Diagnosis Date   Abnormal Pap smear of cervix 09/08/2019   08/28/19: ASCUS w/ +HRHPV   Per 2019 ASCCP guidelines, needs colposcopy as soon as possible.  Immediate risk of CIN3+ is 4.4%.    Anxiety    Headache    Rubella non-immune status, antepartum 03/10/2014   MMR pp    Trichomonal vaginitis 04/29/2015   04/29/2015   POC: neg 07/2015 Repeat positive 10/08/15, POC neg 10/26/2015 04/04/16 + again.     Past Surgical History:  Procedure Laterality Date   DILATION AND CURETTAGE OF UTERUS  2015   Allergies  Allergen Reactions   Hydrocodone Nausea And Vomiting and Rash   Current Facility-Administered Medications on File Prior to Encounter  Medication Dose Route Frequency Provider Last Rate Last Admin   HYDROXYprogesterone caproate (Makena) autoinjector 275 mg  275 mg Subcutaneous Weekly Roma Schanz, CNM   275 mg at 02/14/21 1002   Current Outpatient Medications on File Prior to Encounter  Medication Sig Dispense Refill   acetaminophen (TYLENOL) 325 MG tablet Take 650 mg by mouth every 6 (six) hours as needed for mild pain, moderate pain or headache.      Blood Pressure Monitor MISC For regular home bp monitoring during pregnancy 1 each 0   MAKENA autoinjector      Prenatal Vit-Fe Fumarate-FA (PRENATAL VITAMIN PO) Take by mouth.     promethazine (PHENERGAN) 25 MG tablet Take 0.5-1 tablets  (12.5-25 mg total) by mouth every 6 (six) hours as needed for nausea or vomiting. 30 tablet 0   Social History   Socioeconomic History   Marital status: Single    Spouse name: Not on file   Number of children: 2   Years of education: Not on file   Highest education level: Some college, no degree  Occupational History   Not on file  Tobacco Use   Smoking status: Never   Smokeless tobacco: Never  Vaping Use   Vaping Use: Former  Substance and Sexual Activity   Alcohol use: No    Alcohol/week: 0.0 standard drinks   Drug use: No   Sexual activity: Yes    Birth control/protection: None  Other Topics Concern   Not on file  Social History Narrative   Lives with her 3 year son-Axon-father is in an out of photo   65 year old Rodman Key who lives with father [place]       Enjoys: outside, cleaning-likes to organize      Diet: eats all food groups-doesn't eat steak or lobster   Caffeine: 2-3 cans of soda, and ice coffee daily    Water: 6-8 cups daily      Wears seat belt   Does not use phone while driving   Oceanographer at home   Social Determinants of Health   Financial Resource Strain: Low Risk  Difficulty of Paying Living Expenses: Not hard at all  Food Insecurity: No Food Insecurity   Worried About [place] in the Last Year: Never true   Ran Out of Food in the Last Year: Never true  Transportation Needs: No Transportation Needs   Lack of Transportation (Medical): No   Lack of Transportation (Non-Medical): No  Physical Activity: Insufficiently Active   Days of Exercise per Week: 2 days   Minutes of Exercise per Session: 20 min  Stress: No Stress Concern Present   Feeling of Stress : Only a little  Social Connections: Moderately Isolated   Frequency of Communication with Friends and Family: More than three times a week   Frequency of Social Gatherings with Friends and Family: Twice a week   Attends Religious Services: Never   Marine scientist or  Organizations: No   Attends Music therapist: Never   Marital Status: Living with partner  Intimate Partner Violence: Not At Risk   Fear of Current or Ex-Partner: No   Emotionally Abused: No   Physically Abused: No   Sexually Abused: No   Family History  Problem Relation Age of Onset   Hypertension Mother    Diabetes Sister    Diabetes Maternal Grandfather    Cancer Maternal Grandfather        liver, lung    OBJECTIVE:  Vitals:   02/17/21 1439  BP: 131/82  Pulse: 100  Resp: 16  Temp: 97.9 F (36.6 C)  TempSrc: Oral  SpO2: 98%     General appearance: alert; no distress Skin: 2-3 cm induration of her RT groin; tender to touch; no active drainage Psychological: alert and cooperative; normal mood and affect   ASSESSMENT & PLAN:  1. Abscess of right thigh     Meds ordered this encounter  Medications   cephALEXin (KEFLEX) 500 MG capsule    Sig: Take 1 capsule (500 mg total) by mouth 3 (three) times daily for 7 days.    Dispense:  21 capsule    Refill:  0    Order Specific Question:   Supervising Provider    Answer:   Raylene Everts [[id number]]   Spontaneous drainage during examination Apply warm compresses 3-4x daily for 10-15 minutes Wash site daily with warm water and mild soap Keep covered to avoid friction Take antibiotic as prescribed and to completion Follow up here or with PCP if symptoms persists Return or go to the ED if you have any new or worsening symptoms increased redness, swelling, pain, nausea, vomiting, fever, chills, etc...    Reviewed expectations re: course of current medical issues. Questions answered. Outlined signs and symptoms indicating need for more acute intervention. Patient verbalized understanding. After Visit Summary given.           Lestine Box, PA-C 02/17/21 [place], [place], [place] 02/17/21 1530

## 2021-02-17 NOTE — ED Triage Notes (Signed)
States she has a boil in her right groin area for a few months.  States area has gotten worse.   [redacted] weeks pregnant.

## 2021-02-17 NOTE — Discharge Instructions (Signed)
Spontaneous drainage during examination Apply warm compresses 3-4x daily for 10-15 minutes Wash site daily with warm water and mild soap Keep covered to avoid friction Take antibiotic as prescribed and to completion Follow up here or with PCP if symptoms persists Return or go to the ED if you have any new or worsening symptoms increased redness, swelling, pain, nausea, vomiting, fever, chills, etc..Marland Kitchen

## 2021-02-21 ENCOUNTER — Ambulatory Visit: Payer: Medicaid Other

## 2021-02-22 ENCOUNTER — Ambulatory Visit (INDEPENDENT_AMBULATORY_CARE_PROVIDER_SITE_OTHER): Payer: Medicaid Other | Admitting: Women's Health

## 2021-02-22 ENCOUNTER — Other Ambulatory Visit: Payer: Self-pay

## 2021-02-22 ENCOUNTER — Encounter: Payer: Self-pay | Admitting: Women's Health

## 2021-02-22 VITALS — BP 122/83 | HR 93 | Wt 196.0 lb

## 2021-02-22 DIAGNOSIS — Z3480 Encounter for supervision of other normal pregnancy, unspecified trimester: Secondary | ICD-10-CM

## 2021-02-22 DIAGNOSIS — Z8751 Personal history of pre-term labor: Secondary | ICD-10-CM | POA: Diagnosis not present

## 2021-02-22 DIAGNOSIS — O358XX0 Maternal care for other (suspected) fetal abnormality and damage, not applicable or unspecified: Secondary | ICD-10-CM

## 2021-02-22 DIAGNOSIS — O35EXX0 Maternal care for other (suspected) fetal abnormality and damage, fetal genitourinary anomalies, not applicable or unspecified: Secondary | ICD-10-CM

## 2021-02-22 DIAGNOSIS — Z3483 Encounter for supervision of other normal pregnancy, third trimester: Secondary | ICD-10-CM

## 2021-02-22 NOTE — Patient Instructions (Signed)
Ann Moore, thank you for choosing our office today! We appreciate the opportunity to meet your healthcare needs. You may receive a short survey by mail, e-mail, or through [employer]. If you are happy with your care we would appreciate if you could take just a few minutes to complete the survey questions. We read all of your comments and take your feedback very seriously. Thank you again for choosing our office.  Center for [employer] Team at [place] at [facility] ([place], [place] [address]) Entrance C, located off of [address] parking   CLASSES: Go to [email or web address] to register for classes (childbirth, breastfeeding, waterbirth, infant CPR, daddy bootcamp, etc.)  Call the office [phone]) or go to [facility] if: You begin to have strong, frequent contractions Your water breaks.  Sometimes it is a big gush of fluid, sometimes it is just a trickle that keeps getting your panties wet or running down your legs You have vaginal bleeding.  It is normal to have a small amount of spotting if your cervix was checked.  You don't feel your baby moving like normal.  If you don't, get you something to eat and drink and lay down and focus on feeling your baby move.   If your baby is still not moving like normal, you should call the office or go to [facility].  Call the office [phone]) or go to [facility] hospital for these signs of pre-eclampsia: Severe headache that does not go away with Tylenol Visual changes- seeing spots, double, blurred vision Pain under your right breast or upper abdomen that does not go away with Tums or heartburn medicine Nausea and/or vomiting Severe swelling in your hands, feet, and face   Tdap Vaccine It is recommended that you get the Tdap vaccine during the third trimester of EACH pregnancy to help protect your baby from getting pertussis (whooping cough) 27-36 weeks is the BEST time to do  this so that you can pass the protection on to your baby. During pregnancy is better than after pregnancy, but if you are unable to get it during pregnancy it will be offered at the hospital.  You can get this vaccine with [place], at the health department, your family doctor, or some local pharmacies Everyone who will be around your baby should also be up-to-date on their vaccines before the baby comes. Adults (who are not pregnant) only need 1 dose of Tdap during adulthood.   [facility] Pediatricians/Family Doctors [facility]): [address] [place], [facility]: [address] [place], [phone]                [facility]): [place], [phone] (call to ask if accepting patients) [facility] Department: [address], [place], [place] Pediatricians/Family Doctors [facility]): [place]. [place], [address], [place] [facility]: [address] [place], [place] [facility]: [place], [place] [facility]): [phone] [facility]: [address], [place]: [address] [place] [facility]: [phone], [phone]  Home Blood Pressure Monitoring for Patients   Your provider has recommended that you check your  blood pressure (BP) at least once a week at home. If you do not have a blood pressure cuff at home, one will be provided for you. Contact your provider if you have not received your monitor within 1 week.   Helpful Tips for Accurate Home Blood Pressure Checks  Don't smoke, exercise, or drink caffeine 30 minutes before checking your BP Use the restroom before checking your BP (a full bladder can raise your  pressure) Relax in a comfortable upright chair Feet on the ground Left arm resting comfortably on a flat surface at the level of your heart Legs uncrossed Back supported Sit quietly and don't talk Place the cuff on your bare arm Adjust snuggly, so that only two fingertips can fit between your skin and the top of the cuff Check 2 readings separated by at least one minute Keep a log of your BP readings For a visual, please reference this diagram: http://ccnc.care/bpdiagram  Provider Name: [facility]     Phone: [phone]  Zone 1: ALL CLEAR  Continue to monitor your symptoms:  BP reading is less than 140 (top number) or less than 90 (bottom number)  No right upper stomach pain No headaches or seeing spots No feeling nauseated or throwing up No swelling in face and hands  Zone 2: CAUTION Call your doctor's office for any of the following:  BP reading is greater than 140 (top number) or greater than 90 (bottom number)  Stomach pain under your ribs in the middle or right side Headaches or seeing spots Feeling nauseated or throwing up Swelling in face and hands  Zone 3: EMERGENCY  Seek immediate medical care if you have any of the following:  BP reading is greater than160 (top number) or greater than 110 (bottom number) Severe headaches not improving with Tylenol Serious difficulty catching your breath Any worsening symptoms from Zone 2  Preterm Labor and Birth Information  The normal length of a pregnancy is 39-41 weeks. Preterm labor is when labor starts before 37 completed weeks of pregnancy. What are the risk factors for preterm labor? Preterm labor is more likely to occur in women who: Have certain infections during pregnancy such as a bladder infection, sexually transmitted infection, or infection inside the uterus (chorioamnionitis). Have a shorter-than-normal cervix. Have gone into preterm labor before. Have had surgery on their cervix. Are younger than age 17  or older than age 35. Are African American. Are pregnant with twins or multiple babies (multiple gestation). Take street drugs or smoke while pregnant. Do not gain enough weight while pregnant. Became pregnant shortly after having been pregnant. What are the symptoms of preterm labor? Symptoms of preterm labor include: Cramps similar to those that can happen during a menstrual period. The cramps may happen with diarrhea. Pain in the abdomen or lower back. Regular uterine contractions that may feel like tightening of the abdomen. A feeling of increased pressure in the pelvis. Increased watery or bloody mucus discharge from the vagina. Water breaking (ruptured amniotic sac). Why is it important to recognize signs of preterm labor? It is important to recognize signs of preterm labor because babies who are born prematurely may not be fully developed. This can put them at an increased risk for: Long-term (chronic) heart and lung problems. Difficulty immediately after birth with regulating body systems, including blood sugar, body temperature, heart rate, and breathing rate. Bleeding in the brain. Cerebral palsy. Learning difficulties. Death. These risks are highest for babies who are born before 34 weeks   of pregnancy. How is preterm labor treated? Treatment depends on the length of your pregnancy, your condition, and the health of your baby. It may involve: Having a stitch (suture) placed in your cervix to prevent your cervix from opening too early (cerclage). Taking or being given medicines, such as: Hormone medicines. These may be given early in pregnancy to help support the pregnancy. Medicine to stop contractions. Medicines to help mature the baby's lungs. These may be prescribed if the risk of delivery is high. Medicines to prevent your baby from developing cerebral palsy. If the labor happens before 34 weeks of pregnancy, you may need to stay in the hospital. What should I do if I  think I am in preterm labor? If you think that you are going into preterm labor, call your health care provider right away. How can I prevent preterm labor in future pregnancies? To increase your chance of having a full-term pregnancy: Do not use any tobacco products, such as cigarettes, chewing tobacco, and e-cigarettes. If you need help quitting, ask your health care provider. Do not use street drugs or medicines that have not been prescribed to you during your pregnancy. Talk with your health care provider before taking any herbal supplements, even if you have been taking them regularly. Make sure you gain a healthy amount of weight during your pregnancy. Watch for infection. If you think that you might have an infection, get it checked right away. Make sure to tell your health care provider if you have gone into preterm labor before. This information is not intended to replace advice given to you by your health care provider. Make sure you discuss any questions you have with your health care provider. Document Revised: 09/06/2018 Document Reviewed: 10/06/2015 Elsevier Patient Education  2020 Elsevier Inc.   

## 2021-02-22 NOTE — Progress Notes (Signed)
LOW-RISK PREGNANCY VISIT Patient name: Ann Moore MRN [id number]  Date of birth: 01-09-1991 Chief Complaint:   Routine Prenatal Visit  History of Present Illness:   Ann Moore is a 30 y.o. [id number] female at [redacted]w[redacted]d with an Estimated Date of Delivery: 05/02/21 being seen today for ongoing management of a low-risk pregnancy.   Today she reports  tired, some occ mild pressure . Contractions: Not present. Vag. Bleeding: None.  Movement: Present. denies leaking of fluid.  Depression screen [facility] 2/9 01/26/2021 10/21/2020 02/03/2020 01/28/2020 12/23/2019  Decreased Interest 0 1 1 1 2   Down, Depressed, Hopeless 0 0 0 2 0  PHQ - 2 Score 0 1 1 3 2   Altered sleeping 1 1 1 2 3   Tired, decreased energy 1 1 1 3 3   Change in appetite 0 0 0 1 3  Feeling bad or failure about yourself  0 0 0 1 1  Trouble concentrating 0 1 0 3 2  Moving slowly or fidgety/restless 0 0 1 3 3   Suicidal thoughts 0 0 0 0 0  PHQ-9 Score 2 4 4 16 17   Difficult doing work/chores - - Somewhat difficult Very difficult Very difficult  Some recent data might be hidden     GAD 7 : Generalized Anxiety Score 01/26/2021 10/21/2020 02/03/2020 01/28/2020  Nervous, Anxious, on Edge 1 1 0 3  Control/stop worrying 1 1 1 3   Worry too much - different things 0 1 1 3   Trouble relaxing 1 1 1 3   Restless 0 0 1 3  Easily annoyed or irritable 1 1 0 3  Afraid - awful might happen 0 0 0 3  Total GAD 7 Score 4 5 4 21   Anxiety Difficulty - - Somewhat difficult Very difficult      Review of Systems:   Pertinent items are noted in HPI Denies abnormal vaginal discharge w/ itching/odor/irritation, headaches, visual changes, shortness of breath, chest pain, abdominal pain, severe nausea/vomiting, or problems with urination or bowel movements unless otherwise stated above. Pertinent History Reviewed:  Reviewed past medical,surgical, social, obstetrical and family history.  Reviewed problem list, medications and allergies. Physical Assessment:    Vitals:   02/22/21 0939  BP: 122/83  Pulse: 93  Weight: 196 lb (88.9 kg)  Body mass index is 33.64 kg/m.        Physical Examination:   General appearance: Well appearing, and in no distress  Mental status: Alert, oriented to person, place, and time  Skin: Warm & dry  Cardiovascular: Normal heart rate noted  Respiratory: Normal respiratory effort, no distress  Abdomen: Soft, gravid, nontender  Pelvic: Cervical exam deferred         Extremities: Edema: Trace  Fetal Status: Fetal Heart Rate (bpm): 135 Fundal Height: 30 cm Movement: Present    Chaperone: N/A   No results found for this or any previous visit (from the past 24 hour(s)).  Assessment & Plan:  1) Low-risk pregnancy at [redacted]w[redacted]d with an Estimated Date of Delivery: 05/02/21   2) Fetal mild RPD, repeat u/s next visit  3) H/O 35wk PTB> continue weekly Makena   Meds: No orders of the defined types were placed in this encounter.  Labs/procedures today: Makena and declined flu shot  Plan:  Continue routine obstetrical care  Next visit: prefers will be in person for u/s     Reviewed: Preterm labor symptoms and general obstetric precautions including but not limited to vaginal bleeding, contractions, leaking of fluid and fetal  movement were reviewed in detail with the patient.  All questions were answered. Does have home bp cuff. Office bp cuff given: not applicable. Check bp weekly, let us know if consistently >140 and/or >90.  Follow-up: Return in about 2 weeks (around 03/08/2021) for LROB, US:OB F/U kidneys, CNM, in person.  Future Appointments  Date Time Provider Department Center  02/28/2021  9:50 AM [facility] NURSE [facility]  03/07/2021  9:50 AM [facility] NURSE [facility]  03/14/2021  9:50 AM [facility] NURSE [facility]  03/21/2021  9:50 AM [facility] NURSE [facility]  03/28/2021  9:50 AM [facility] NURSE [facility]  04/04/2021  9:50 AM [facility] NURSE [facility]    Orders  Placed This Encounter  Procedures   US OB Follow Up   Cheral Marker CNM, WHNP-BC 02/22/2021 10:06 AM

## 2021-02-28 ENCOUNTER — Ambulatory Visit (INDEPENDENT_AMBULATORY_CARE_PROVIDER_SITE_OTHER): Payer: Medicaid Other

## 2021-02-28 ENCOUNTER — Other Ambulatory Visit: Payer: Self-pay

## 2021-02-28 VITALS — BP 132/83 | HR 94 | Wt 201.0 lb

## 2021-02-28 DIAGNOSIS — Z8751 Personal history of pre-term labor: Secondary | ICD-10-CM | POA: Diagnosis not present

## 2021-02-28 DIAGNOSIS — Z3A31 31 weeks gestation of pregnancy: Secondary | ICD-10-CM

## 2021-02-28 DIAGNOSIS — Z3480 Encounter for supervision of other normal pregnancy, unspecified trimester: Secondary | ICD-10-CM

## 2021-02-28 NOTE — Progress Notes (Signed)
   NURSE VISIT- INJECTION  SUBJECTIVE:  Ann Moore is a 30 y.o. [phone] female here for a Makena for history of preterm birth. She is [redacted]w[redacted]d pregnant.   OBJECTIVE:  BP 132/83   Pulse 94   Wt 201 lb (91.2 kg)   LMP 07/26/2020 (Exact Date)   BMI 34.50 kg/m   Appears well, in no apparent distress  Injection administered in: Left arm  No orders of the defined types were placed in this encounter.   ASSESSMENT: Pregnancy [redacted]w[redacted]d Makena for history of preterm birth PLAN: Follow-up: in 1 week for next Makena   Bernese Doffing A Charina Fons  02/28/2021 9:46 AM

## 2021-03-06 ENCOUNTER — Inpatient Hospital Stay (HOSPITAL_COMMUNITY)
Admission: AD | Admit: 2021-03-06 | Discharge: 2021-03-06 | Disposition: A | Discharge status: Home / Self Care | Payer: Medicaid Other | Attending: Obstetrics & Gynecology | Admitting: Obstetrics & Gynecology

## 2021-03-06 ENCOUNTER — Encounter (HOSPITAL_COMMUNITY): Payer: Self-pay | Admitting: Obstetrics & Gynecology

## 2021-03-06 ENCOUNTER — Other Ambulatory Visit: Payer: Self-pay

## 2021-03-06 DIAGNOSIS — Z3A31 31 weeks gestation of pregnancy: Secondary | ICD-10-CM | POA: Insufficient documentation

## 2021-03-06 DIAGNOSIS — O26893 Other specified pregnancy related conditions, third trimester: Secondary | ICD-10-CM

## 2021-03-06 DIAGNOSIS — Z885 Allergy status to narcotic agent status: Secondary | ICD-10-CM | POA: Diagnosis not present

## 2021-03-06 DIAGNOSIS — R109 Unspecified abdominal pain: Secondary | ICD-10-CM | POA: Diagnosis not present

## 2021-03-06 LAB — WET PREP, GENITAL
Clue Cells Wet Prep HPF POC: NONE SEEN
Sperm: NONE SEEN
Trich, Wet Prep: NONE SEEN
Yeast Wet Prep HPF POC: NONE SEEN

## 2021-03-06 LAB — AMNISURE RUPTURE OF MEMBRANE (ROM) NOT AT ARMC: Amnisure ROM: NEGATIVE

## 2021-03-06 NOTE — MAU Provider Note (Signed)
Obstetric Attending MAU Note  Chief Complaint:  Contractions and Rupture of Membranes  Event Date/Time  First Provider Initiated Contact with Patient 03/06/21 1650     HPI: Ann Moore is a 30 y.o. [id number] at 42w6dwho presents to maternity admissions reporting one episode of trickling of clear fluid around 1330 today. She states she has not continued to leak since.  Pt reports irregular contractions, and 4/10 lower abdominal pain.  Denies vaginal bleeding. Good fetal movement.  Denies recent intercourse. Denies any abnormal vaginal discharge, fevers, chills, sweats, dysuria, nausea, vomiting, other GI or GU symptoms or other general symptoms.  [facility]   Diagnosis Date Noted   Request for sterilization 12/22/2020   Asymptomatic bacteriuria during pregnancy 10/27/2020   Encounter for supervision of normal pregnancy, antepartum 10/21/2020   History of preterm delivery 10/21/2020   Chlamydia 04/05/2020   Adult ADHD 11/06/2019   Generalized headaches 09/25/2019   Nicotine abuse 09/25/2019   Abnormal Pap smear of cervix 09/08/2019   Marijuana use 09/20/2015   Depression with anxiety 12/09/2014   Rubella non-immune status, antepartum 03/10/2014    Past Medical History:  Diagnosis Date   Abnormal Pap smear of cervix 09/08/2019   08/28/19: ASCUS w/ +HRHPV   Per 2019 ASCCP guidelines, needs colposcopy as soon as possible.  Immediate risk of CIN3+ is 4.4%.    Anxiety    Headache    Rubella non-immune status, antepartum 03/10/2014   MMR pp    Trichomonal vaginitis 04/29/2015   04/29/2015   POC: neg 07/2015 Repeat positive 10/08/15, POC neg 10/26/2015 04/04/16 + again.      OB History  Gravida Para Term Preterm AB Living  '4 2 1 1 1 2  ' SAB IAB Ectopic Multiple Live Births  1 0 0 0 2    # Outcome Date GA Lbr Len/2nd Weight Sex Delivery Anes PTL Lv  4 Current           3 Preterm 02/20/16 352w3d0:15 / 00:44 2880 g M  Vag-Spont EPI Y LIV  2 Term 04/04/09 3868w5d:00 3232 g M Vag-Spont EPI N LIV  1 SAB              [place] Past Surgical History:  Procedure Laterality Date   DILATION AND CURETTAGE OF UTERUS  2015    Family History: Family History  Problem Relation Age of Onset   Hypertension Mother    Diabetes Sister    Diabetes Maternal Grandfather    Cancer Maternal Grandfather        liver, lung    Social History: Social History   Tobacco Use   Smoking status: Never   Smokeless tobacco: Never  Vaping Use   Vaping Use: Former  Substance Use Topics   Alcohol use: No    Alcohol/week: 0.0 standard drinks   Drug use: No    Allergies:  Allergies  Allergen Reactions   Hydrocodone Nausea And Vomiting and Rash    Facility-Administered Medications Prior to Admission  Medication Dose Route Frequency Provider Last Rate Last Admin   HYDROXYprogesterone caproate (Makena) autoinjector 275 mg  275 mg Subcutaneous Weekly BooRoma SchanzNM   275 mg at 02/28/21 0941   Medications Prior to Admission  Medication Sig Dispense Refill Last Dose   acetaminophen (TYLENOL) 325 MG tablet Take 650 mg by mouth every 6 (six) hours as needed for mild  pain, moderate pain or headache.    Past Month   Prenatal Vit-Fe Fumarate-FA (PRENATAL VITAMIN PO) Take by mouth.   03/06/2021   Blood Pressure Monitor MISC For regular home bp monitoring during pregnancy 1 each 0    MAKENA autoinjector       mupirocin ointment (BACTROBAN) 2 % Apply 1 application topically 2 (two) times daily. 22 g 0    promethazine (PHENERGAN) 25 MG tablet Take 0.5-1 tablets (12.5-25 mg total) by mouth every 6 (six) hours as needed for nausea or vomiting. 30 tablet 0     ROS: Pertinent findings in history of present illness.  Physical Exam  Blood pressure 134/73, pulse (!) 111, temperature 98.5 F (36.9 C), temperature source Oral, resp. rate 16, last menstrual period 07/26/2020, SpO2 97  %. CONSTITUTIONAL: Well-developed, well-nourished female in no acute distress.  HENT:  Normocephalic, atraumatic, External right and left ear normal.  EYES: Conjunctivae and EOM are normal. Pupils are equal, round, and reactive to light. No scleral icterus.  NECK: Normal range of motion, supple, no masses SKIN: Skin is warm and dry. No rash noted. Not diaphoretic. No erythema. No pallor. [place]: Alert and oriented to person, place, and time. Normal reflexes, muscle tone coordination. No cranial nerve deficit noted. PSYCHIATRIC: Normal mood and affect. Normal behavior. Normal judgment and thought content. CARDIOVASCULAR: Normal heart rate noted, regular rhythm RESPIRATORY: Effort and breath sounds normal, no problems with respiration noted ABDOMEN: Soft, mild suprapubic tenderness, nondistended, gravid appropriate for gestational age MUSCULOSKELETAL: Normal range of motion. No edema and no tenderness. 2+ distal pulses.  SPECULUM EXAM: NEFG, white discharge, no blood, cervix clean STERILE DIGITAL EXAM: Cervix long/thick/closed    FHT:  Baseline 135 , moderate variability, accelerations present, no decelerations Contractions: 2-3/hour  MAU Course: UA, wet prep and GC/Chlam ordered Amnisure sample obtained   Labs: Results for orders placed or performed during the hospital encounter of 03/06/21 (from the past 24 hour(s))  Amnisure rupture of membrane (rom)not at [facility]     Status: None   Collection Time: 03/06/21  5:02 PM  Result Value Ref Range   Amnisure ROM NEGATIVE   Wet prep, genital     Status: Abnormal   Collection Time: 03/06/21  5:02 PM  Result Value Ref Range   Yeast Wet Prep HPF POC NONE SEEN NONE SEEN   Trich, Wet Prep NONE SEEN NONE SEEN   Clue Cells Wet Prep HPF POC NONE SEEN NONE SEEN   WBC, Wet Prep HPF POC MANY (A) NONE SEEN   Sperm NONE SEEN    Unable to collect urine sample even after drinking 3 cups of water and waiting almost 2 hours!!  Will get urinalysis in  office tomorrow.   Assessment: 1. Abdominal pain during pregnancy in third trimester   2. [redacted] weeks gestation of pregnancy     Plan: Negative evaluation for ROM, negative wet prep. UA unable to be collected, will be checked in office.  GC/Chlam pending. Discharge home Preterm labor precautions and fetal kick counts reviewed Follow up with OB provider tomorrow 03/07/2021   Future Appointments  Date Time Provider [place]  03/07/2021 10:30 AM [facility] [place] [facility] None  03/07/2021 11:10 AM Florian Buff, MD [facility]  03/14/2021  9:50 AM [facility] NURSE [facility]  03/21/2021  9:50 AM [facility] NURSE [facility]  03/28/2021  9:50 AM [facility] NURSE [facility]  04/04/2021  9:50 AM [facility] NURSE [facility]     Allergies as of  03/06/2021       Reactions   Hydrocodone Nausea And Vomiting, Rash        Medication List     TAKE these medications    acetaminophen 325 MG tablet Commonly known as: TYLENOL Take 650 mg by mouth every 6 (six) hours as needed for mild pain, moderate pain or headache.   Blood Pressure Monitor Misc For regular home bp monitoring during pregnancy   Makena autoinjector Generic drug: HYDROXYprogesterone caproate   mupirocin ointment 2 % Commonly known as: BACTROBAN Apply 1 application topically 2 (two) times daily.   PRENATAL VITAMIN PO Take by mouth.   promethazine 25 MG tablet Commonly known as: PHENERGAN Take 0.5-1 tablets (12.5-25 mg total) by mouth every 6 (six) hours as needed for nausea or vomiting.        Osborne Oman, MD 03/06/2021 6:35 PM

## 2021-03-06 NOTE — MAU Note (Signed)
Pt reports to mau with c/o 1 episode of trickling of clear fluid around 1330 today. Pt states she has not continued to leak since.  Pt reports irreg ctx.  Denies vag bleeding. +FM Denies recent intercourse.

## 2021-03-07 ENCOUNTER — Encounter: Payer: Self-pay | Admitting: Obstetrics & Gynecology

## 2021-03-07 ENCOUNTER — Ambulatory Visit (INDEPENDENT_AMBULATORY_CARE_PROVIDER_SITE_OTHER): Payer: Medicaid Other

## 2021-03-07 ENCOUNTER — Ambulatory Visit (INDEPENDENT_AMBULATORY_CARE_PROVIDER_SITE_OTHER): Payer: Medicaid Other | Admitting: Obstetrics & Gynecology

## 2021-03-07 ENCOUNTER — Ambulatory Visit: Payer: Medicaid Other

## 2021-03-07 DIAGNOSIS — Z3483 Encounter for supervision of other normal pregnancy, third trimester: Secondary | ICD-10-CM | POA: Diagnosis not present

## 2021-03-07 DIAGNOSIS — Z3480 Encounter for supervision of other normal pregnancy, unspecified trimester: Secondary | ICD-10-CM

## 2021-03-07 DIAGNOSIS — Z8751 Personal history of pre-term labor: Secondary | ICD-10-CM | POA: Diagnosis not present

## 2021-03-07 DIAGNOSIS — Z3A32 32 weeks gestation of pregnancy: Secondary | ICD-10-CM | POA: Diagnosis not present

## 2021-03-07 DIAGNOSIS — O35EXX0 Maternal care for other (suspected) fetal abnormality and damage, fetal genitourinary anomalies, not applicable or unspecified: Secondary | ICD-10-CM | POA: Diagnosis not present

## 2021-03-07 LAB — GC/CHLAMYDIA PROBE AMP (~~LOC~~) NOT AT ARMC
Chlamydia: NEGATIVE
Comment: NEGATIVE
Comment: NORMAL
Neisseria Gonorrhea: NEGATIVE

## 2021-03-07 NOTE — Progress Notes (Signed)
LOW-RISK PREGNANCY VISIT Patient name: Ann Moore MRN [id number]  Date of birth: 13-Oct-1990 Chief Complaint:   Routine Prenatal Visit (F/u ultrasound)  History of Present Illness:   Ann Moore is a 30 y.o. [id number] female at [redacted]w[redacted]d with an Estimated Date of Delivery: 05/02/21 being seen today for ongoing management of a low-risk pregnancy.  Depression screen [facility] 2/9 01/26/2021 10/21/2020 02/03/2020 01/28/2020 12/23/2019  Decreased Interest 0 1 1 1 2   Down, Depressed, Hopeless 0 0 0 2 0  PHQ - 2 Score 0 1 1 3 2   Altered sleeping 1 1 1 2 3   Tired, decreased energy 1 1 1 3 3   Change in appetite 0 0 0 1 3  Feeling bad or failure about yourself  0 0 0 1 1  Trouble concentrating 0 1 0 3 2  Moving slowly or fidgety/restless 0 0 1 3 3   Suicidal thoughts 0 0 0 0 0  PHQ-9 Score 2 4 4 16 17   Difficult doing work/chores - - Somewhat difficult Very difficult Very difficult  Some recent data might be hidden    Today she reports no complaints. Contractions: Irritability. Vag. Bleeding: None.  Movement: Present. denies leaking of fluid. Review of Systems:   Pertinent items are noted in HPI Denies abnormal vaginal discharge w/ itching/odor/irritation, headaches, visual changes, shortness of breath, chest pain, abdominal pain, severe nausea/vomiting, or problems with urination or bowel movements unless otherwise stated above. Pertinent History Reviewed:  Reviewed past medical,surgical, social, obstetrical and family history.  Reviewed problem list, medications and allergies. Physical Assessment:   Vitals:   03/07/21 1109 03/07/21 1110  BP: (!) 141/92 137/87  Pulse: 99   Weight: 205 lb (93 kg)   Body mass index is 35.19 kg/m.        Physical Examination:   General appearance: Well appearing, and in no distress  Mental status: Alert, oriented to person, place, and time  Skin: Warm & dry  Cardiovascular: Normal heart rate noted  Respiratory: Normal respiratory effort, no  distress  Abdomen: Soft, gravid, nontender  Pelvic: Cervical exam deferred         Extremities: Edema: Trace  Fetal Status:     Movement: Present    Chaperone: n/a    Results for orders placed or performed during the hospital encounter of 03/06/21 (from the past 24 hour(s))  Wet prep, genital   Collection Time: 03/06/21  5:02 PM  Result Value Ref Range   Yeast Wet Prep HPF POC NONE SEEN NONE SEEN   Trich, Wet Prep NONE SEEN NONE SEEN   Clue Cells Wet Prep HPF POC NONE SEEN NONE SEEN   WBC, Wet Prep HPF POC MANY (A) NONE SEEN   Sperm NONE SEEN   Amnisure rupture of membrane (rom)not at [facility]   Collection Time: 03/06/21  5:02 PM  Result Value Ref Range   Amnisure ROM NEGATIVE     Assessment & Plan:  1) Low-risk pregnancy 05/07/21 at [redacted]w[redacted]d with an Estimated Date of Delivery: 05/02/21   2) Mild RPD in a female, sonogram post natally  3) 90% EFW, repeat 36 weeks, normal glucola   Meds: No orders of the defined types were placed in this encounter.  Labs/procedures today: sonogram  Plan:  Continue routine obstetrical care  Next visit: prefers in person    Reviewed: Preterm labor symptoms and general obstetric precautions including but not limited to vaginal bleeding, contractions, leaking of fluid and fetal movement were reviewed in detail with the  patient.  All questions were answered. Has home bp cuff. Rx faxed to . Check bp daily let us know if >140/90.   Follow-up: Return in about 2 weeks (around 03/21/2021) for LROB.  No orders of the defined types were placed in this encounter.   Lazaro Arms, MD 03/07/2021 11:34 AM

## 2021-03-07 NOTE — Progress Notes (Signed)
[place] 32 wks,cephalic,CX length 4.3 cm,anterior placenta gr 0,afi 15.7 cm,mild bilateral renal pelvic dilatation with dilated ureters,LK 7.3 mm,RK 5.8 mm,FHR 138 bpm,EFW 2291 g 90%,AC 97.5%

## 2021-03-13 ENCOUNTER — Encounter: Payer: Self-pay | Admitting: Family Medicine

## 2021-03-13 ENCOUNTER — Encounter (HOSPITAL_COMMUNITY): Payer: Self-pay | Admitting: Family Medicine

## 2021-03-13 ENCOUNTER — Inpatient Hospital Stay (HOSPITAL_COMMUNITY)
Admission: AD | Admit: 2021-03-13 | Discharge: 2021-03-13 | Disposition: A | Discharge status: Home / Self Care | Payer: Medicaid Other | Attending: Family Medicine | Admitting: Family Medicine

## 2021-03-13 DIAGNOSIS — O26893 Other specified pregnancy related conditions, third trimester: Secondary | ICD-10-CM | POA: Diagnosis present

## 2021-03-13 DIAGNOSIS — O219 Vomiting of pregnancy, unspecified: Secondary | ICD-10-CM

## 2021-03-13 DIAGNOSIS — O133 Gestational [pregnancy-induced] hypertension without significant proteinuria, third trimester: Secondary | ICD-10-CM | POA: Diagnosis not present

## 2021-03-13 DIAGNOSIS — O212 Late vomiting of pregnancy: Secondary | ICD-10-CM

## 2021-03-13 DIAGNOSIS — Z3A32 32 weeks gestation of pregnancy: Secondary | ICD-10-CM | POA: Diagnosis not present

## 2021-03-13 LAB — COMPREHENSIVE METABOLIC PANEL
ALT: 14 U/L (ref 0–44)
AST: 20 U/L (ref 15–41)
Albumin: 2.7 g/dL — ABNORMAL LOW (ref 3.5–5.0)
Alkaline Phosphatase: 101 U/L (ref 38–126)
Anion gap: 9 (ref 5–15)
BUN: 5 mg/dL — ABNORMAL LOW (ref 6–20)
CO2: 22 mmol/L (ref 22–32)
Calcium: 8.7 mg/dL — ABNORMAL LOW (ref 8.9–10.3)
Chloride: 104 mmol/L (ref 98–111)
Creatinine, Ser: 0.62 mg/dL (ref 0.44–1.00)
GFR, Estimated: >60 mL/min (ref >60)
Glucose, Bld: 96 mg/dL (ref 70–99)
Potassium: 4 mmol/L (ref 3.5–5.1)
Sodium: 135 mmol/L (ref 135–145)
Total Bilirubin: 0.5 mg/dL (ref 0.3–1.2)
Total Protein: 6 g/dL — ABNORMAL LOW (ref 6.5–8.1)

## 2021-03-13 LAB — CBC
HCT: 31.5 % — ABNORMAL LOW (ref 36.0–46.0)
Hemoglobin: 10.4 g/dL — ABNORMAL LOW (ref 12.0–15.0)
MCH: 28 pg (ref 26.0–34.0)
MCHC: 33 g/dL (ref 30.0–36.0)
MCV: 84.7 fL (ref 80.0–100.0)
Platelets: 175 10*3/uL (ref 150–400)
RBC: 3.72 MIL/uL — ABNORMAL LOW (ref 3.87–5.11)
RDW: 13.3 % (ref 11.5–15.5)
WBC: 6.9 10*3/uL (ref 4.0–10.5)
nRBC: 0 % (ref 0.0–0.2)

## 2021-03-13 MED ORDER — ONDANSETRON 4 MG PO TBDP
8.0000 mg | ORAL_TABLET | Freq: Once | ORAL | Status: AC
  Administered 2021-03-13: 8 mg via ORAL
  Filled 2021-03-13: qty 2

## 2021-03-13 MED ORDER — ONDANSETRON 8 MG PO TBDP: 8.0000 mg | ORAL_TABLET | Freq: Three times a day (TID) | ORAL | 0 refills | Status: DC | PRN

## 2021-03-13 NOTE — MAU Note (Addendum)
Patient arrived to MAU complaining of back pain, abdominal pain, N/V, SOB, elevated blood pressures. Light headed and dizziness. Patient stated that the symptoms all started Friday night when she had supper at her mothers house. Patient stated that she checked her blood pressure and it was reading 140's over 90's. Patient also reports that she has decreased fetal movement since she woke up at 0930am.  Patient stated that she almost fainted in the grocery store last night at 1900.  Patient called the nurses line and she said they directed her to come to MAU. No vaginal bleeding, No leakage of fluid.

## 2021-03-13 NOTE — MAU Provider Note (Signed)
History     CSN: [id number]  Arrival date and time: 03/13/21 1425   Event Date/Time   First Provider Initiated Contact with Patient 03/13/21 1501      Chief Complaint  Patient presents with   Abdominal Pain   Nausea   Emesis   Hypertension   Shortness of Breath   Back Pain   light headed   Dizziness   HPI Ann Moore is a 30 y.o. [id number] at 69w6dwho presents with several complaints. She reports Friday night she had spaghetti and threw it up. Since then, she has felt nauseous and has not been eating or drinking much. She also reports lower abdominal and back pain that has been ongoing since Friday. She reports intermittent light headedness and dizziness, none now. She also reports elevated BPs at home that were in the 140s/90s. She denies any HA, visual changes or epigastric pain. She denies any leaking or bleeding. Reports normal fetal movement.   OB History     Gravida  4   Para  2   Term  1   Preterm  1   AB  1   Living  2      SAB  1   IAB  0   Ectopic  0   Multiple  0   Live Births  2           Past Medical History:  Diagnosis Date   Abnormal Pap smear of cervix 09/08/2019   08/28/19: ASCUS w/ +HRHPV   Per 2019 ASCCP guidelines, needs colposcopy as soon as possible.  Immediate risk of CIN3+ is 4.4%.    Anxiety    Headache    Rubella non-immune status, antepartum 03/10/2014   MMR pp    Trichomonal vaginitis 04/29/2015   04/29/2015   POC: neg 07/2015 Repeat positive 10/08/15, POC neg 10/26/2015 04/04/16 + again.      Past Surgical History:  Procedure Laterality Date   DILATION AND CURETTAGE OF UTERUS  2015    Family History  Problem Relation Age of Onset   Hypertension Mother    Diabetes Sister    Diabetes Maternal Grandfather    Cancer Maternal Grandfather        liver, lung    Social History   Tobacco Use   Smoking status: Never   Smokeless tobacco: Never  Vaping Use   Vaping Use: Former  Substance Use Topics   Alcohol  use: No    Alcohol/week: 0.0 standard drinks   Drug use: No    Allergies:  Allergies  Allergen Reactions   Hydrocodone Nausea And Vomiting and Rash    Facility-Administered Medications Prior to Admission  Medication Dose Route Frequency Provider Last Rate Last Admin   HYDROXYprogesterone caproate (Makena) autoinjector 275 mg  275 mg Subcutaneous Weekly BRoma Schanz CNM   275 mg at 03/07/21 1112   Medications Prior to Admission  Medication Sig Dispense Refill Last Dose   acetaminophen (TYLENOL) 325 MG tablet Take 650 mg by mouth every 6 (six) hours as needed for mild pain, moderate pain or headache.    03/12/2021   Prenatal Vit-Fe Fumarate-FA (PRENATAL VITAMIN PO) Take by mouth.   Past Week   Blood Pressure Monitor MISC For regular home bp monitoring during pregnancy 1 each 0    MAKENA autoinjector       mupirocin ointment (BACTROBAN) 2 % Apply 1 application topically 2 (two) times daily. 22 g 0    promethazine (PHENERGAN) 25 MG tablet  Take 0.5-1 tablets (12.5-25 mg total) by mouth every 6 (six) hours as needed for nausea or vomiting. 30 tablet 0     Review of Systems  Constitutional: Negative.  Negative for fatigue and fever.  HENT: Negative.    Respiratory: Negative.  Negative for shortness of breath.   Cardiovascular: Negative.  Negative for chest pain.  Gastrointestinal:  Positive for abdominal pain and nausea. Negative for constipation, diarrhea and vomiting.  Genitourinary: Negative.  Negative for dysuria, vaginal bleeding and vaginal discharge.  Musculoskeletal:  Positive for back pain.  Neurological:  Positive for dizziness and light-headedness. Negative for headaches.  Physical Exam   Blood pressure 126/72, pulse 85, temperature 97.8 F (36.6 C), temperature source Oral, resp. rate 17, last menstrual period 07/26/2020, SpO2 99 %. Patient Vitals for the past 24 hrs:  BP Temp Temp src Pulse Resp SpO2  03/13/21 1654 126/72 97.8 F (36.6 C) Oral 85 17 99 %   03/13/21 1653 126/72 -- -- 85 -- --  03/13/21 1650 -- -- -- -- -- 99 %  03/13/21 1645 121/75 -- -- 89 -- 99 %  03/13/21 1640 -- -- -- -- -- 99 %  03/13/21 1635 -- -- -- -- -- 99 %  03/13/21 1630 130/87 -- -- (!) 102 -- 98 %  03/13/21 1625 -- -- -- -- -- 99 %  03/13/21 1620 -- -- -- -- -- 99 %  03/13/21 1615 132/85 -- -- (!) 111 -- 99 %  03/13/21 1610 -- -- -- -- -- 98 %  03/13/21 1605 -- -- -- -- -- 98 %  03/13/21 1600 116/78 -- -- 92 -- 98 %  03/13/21 1555 -- -- -- -- -- 99 %  03/13/21 1550 -- -- -- -- -- 98 %  03/13/21 1545 119/85 -- -- (!) 101 -- 99 %  03/13/21 1540 -- -- -- -- -- 99 %  03/13/21 1535 -- -- -- -- -- 99 %  03/13/21 1530 123/76 -- -- 87 -- 99 %  03/13/21 1525 -- -- -- -- -- 99 %  03/13/21 1520 -- -- -- -- -- 99 %  03/13/21 1515 119/84 -- -- (!) 108 -- 99 %  03/13/21 1510 -- -- -- -- -- 99 %  03/13/21 1505 -- -- -- -- -- 99 %  03/13/21 1500 116/78 -- -- 100 -- 99 %  03/13/21 1455 -- -- -- -- -- 99 %  03/13/21 1450 -- -- -- -- -- 99 %  03/13/21 1449 -- -- -- -- -- 99 %  03/13/21 1446 127/83 -- -- (!) 119 -- --  03/13/21 1445 -- -- -- -- -- 99 %    Physical Exam Vitals and nursing note reviewed.  Constitutional:      General: She is not in acute distress.    Appearance: She is well-developed.  HENT:     Head: Normocephalic.  Eyes:     Pupils: Pupils are equal, round, and reactive to light.  Cardiovascular:     Rate and Rhythm: Normal rate and regular rhythm.     Heart sounds: Normal heart sounds.  Pulmonary:     Effort: Pulmonary effort is normal. No respiratory distress.     Breath sounds: Normal breath sounds.  Abdominal:     General: Bowel sounds are normal. There is no distension.     Palpations: Abdomen is soft.     Tenderness: There is no abdominal tenderness.  Skin:    General: Skin is warm and dry.  Neurological:     Mental Status: She is alert and oriented to person, place, and time.  Psychiatric:        Mood and Affect: Mood normal.         Behavior: Behavior normal.        Thought Content: Thought content normal.        Judgment: Judgment normal.   Fetal Tracing:  Baseline: 120 Variability: moderate Accels: 15x15 Decels: none  Toco: none  Dilation: Closed Exam by:: C. Neil,CNM   MAU Course  Procedures Results for orders placed or performed during the hospital encounter of 03/13/21 (from the past 24 hour(s))  CBC     Status: Abnormal   Collection Time: 03/13/21  3:20 PM  Result Value Ref Range   WBC 6.9 4.0 - 10.5 K/uL   RBC 3.72 (L) 3.87 - 5.11 MIL/uL   Hemoglobin 10.4 (L) 12.0 - 15.0 g/dL   HCT 31.5 (L) 36.0 - 46.0 %   MCV 84.7 80.0 - 100.0 fL   MCH 28.0 26.0 - 34.0 pg   MCHC 33.0 30.0 - 36.0 g/dL   RDW 13.3 11.5 - 15.5 %   Platelets 175 150 - 400 K/uL   nRBC 0.0 0.0 - 0.2 %  Comprehensive metabolic panel     Status: Abnormal   Collection Time: 03/13/21  3:20 PM  Result Value Ref Range   Sodium 135 135 - 145 mmol/L   Potassium 4.0 3.5 - 5.1 mmol/L   Chloride 104 98 - 111 mmol/L   CO2 22 22 - 32 mmol/L   Glucose, Bld 96 70 - 99 mg/dL   BUN 5 (L) 6 - 20 mg/dL   Creatinine, Ser 0.62 0.44 - 1.00 mg/dL   Calcium 8.7 (L) 8.9 - 10.3 mg/dL   Total Protein 6.0 (L) 6.5 - 8.1 g/dL   Albumin 2.7 (L) 3.5 - 5.0 g/dL   AST 20 15 - 41 U/L   ALT 14 0 - 44 U/L   Alkaline Phosphatase 101 38 - 126 U/L   Total Bilirubin 0.5 0.3 - 1.2 mg/dL   GFR, Estimated >60 >60 mL/min   Anion gap 9 5 - 15    MDM Patient urinated before coming to room and unable to leave urine sample CBC, CMP Zofran- patient reports resolution of nausea  Assessment and Plan   1. Nausea and vomiting during pregnancy   2. Gestational hypertension, third trimester   3. [redacted] weeks gestation of pregnancy    -Discharge home in stable condition -Preeclampsia precautions discussed -Patient advised to follow-up with OB as scheduled for prenatal care -Patient may return to MAU as needed or if her condition were to change or  worsen   Wende Mott CNM 03/13/2021, 3:01 PM

## 2021-03-14 ENCOUNTER — Ambulatory Visit: Payer: Medicaid Other

## 2021-03-15 DIAGNOSIS — Z3A00 Weeks of gestation of pregnancy not specified: Secondary | ICD-10-CM | POA: Diagnosis not present

## 2021-03-15 DIAGNOSIS — Z0379 Encounter for other suspected maternal and fetal conditions ruled out: Secondary | ICD-10-CM | POA: Diagnosis not present

## 2021-03-20 DIAGNOSIS — Z0379 Encounter for other suspected maternal and fetal conditions ruled out: Secondary | ICD-10-CM | POA: Diagnosis not present

## 2021-03-20 DIAGNOSIS — Z3A00 Weeks of gestation of pregnancy not specified: Secondary | ICD-10-CM | POA: Diagnosis not present

## 2021-03-21 ENCOUNTER — Ambulatory Visit (INDEPENDENT_AMBULATORY_CARE_PROVIDER_SITE_OTHER): Payer: Medicaid Other | Admitting: Women's Health

## 2021-03-21 ENCOUNTER — Encounter: Payer: Self-pay | Admitting: Women's Health

## 2021-03-21 ENCOUNTER — Other Ambulatory Visit: Payer: Self-pay

## 2021-03-21 ENCOUNTER — Telehealth: Payer: Self-pay

## 2021-03-21 ENCOUNTER — Ambulatory Visit: Payer: Medicaid Other

## 2021-03-21 VITALS — BP 140/91 | HR 109 | Wt 208.5 lb

## 2021-03-21 DIAGNOSIS — O09213 Supervision of pregnancy with history of pre-term labor, third trimester: Secondary | ICD-10-CM | POA: Diagnosis not present

## 2021-03-21 DIAGNOSIS — Z3480 Encounter for supervision of other normal pregnancy, unspecified trimester: Secondary | ICD-10-CM

## 2021-03-21 DIAGNOSIS — Z8751 Personal history of pre-term labor: Secondary | ICD-10-CM | POA: Diagnosis not present

## 2021-03-21 DIAGNOSIS — O0993 Supervision of high risk pregnancy, unspecified, third trimester: Secondary | ICD-10-CM

## 2021-03-21 DIAGNOSIS — L299 Pruritus, unspecified: Secondary | ICD-10-CM

## 2021-03-21 DIAGNOSIS — Z3A34 34 weeks gestation of pregnancy: Secondary | ICD-10-CM | POA: Diagnosis not present

## 2021-03-21 DIAGNOSIS — O133 Gestational [pregnancy-induced] hypertension without significant proteinuria, third trimester: Secondary | ICD-10-CM | POA: Diagnosis not present

## 2021-03-21 LAB — POCT URINALYSIS DIPSTICK OB
Blood, UA: NEGATIVE
Glucose, UA: NEGATIVE
Ketones, UA: NEGATIVE
Leukocytes, UA: NEGATIVE
Nitrite, UA: NEGATIVE
POC,PROTEIN,UA: NEGATIVE

## 2021-03-21 NOTE — Progress Notes (Signed)
HIGH-RISK PREGNANCY VISIT Patient name: Ann Moore MRN [id number]  Date of birth: [birth year] Chief Complaint:   Routine Prenatal Visit (17P today; BP running high at home; swelling in feet and hands; palms of hands, back and thighs itch)  History of Present Illness:   Ann Moore is a 30 y.o. [id number] female at [redacted]w[redacted]d with an Estimated Date of Delivery: 05/02/21 being seen today for ongoing management of a high-risk pregnancy complicated by gestational hypertension currently on no meds.    Today she reports  itching palms/legs, worse at night. Elevated bp's at home . Denies ha, visual changes, ruq/epigastric pain, n/v.   Contractions: Irritability. Vag. Bleeding: None.  Movement: Present. denies leaking of fluid.   Depression screen [facility] 2/9 01/26/2021 10/21/2020 02/03/2020 01/28/2020 12/23/2019  Decreased Interest 0 1 1 1 2   Down, Depressed, Hopeless 0 0 0 2 0  PHQ - 2 Score 0 1 1 3 2   Altered sleeping 1 1 1 2 3   Tired, decreased energy 1 1 1 3 3   Change in appetite 0 0 0 1 3  Feeling bad or failure about yourself  0 0 0 1 1  Trouble concentrating 0 1 0 3 2  Moving slowly or fidgety/restless 0 0 1 3 3   Suicidal thoughts 0 0 0 0 0  PHQ-9 Score 2 4 4 16 17   Difficult doing work/chores - - Somewhat difficult Very difficult Very difficult  Some recent data might be hidden     GAD 7 : Generalized Anxiety Score 01/26/2021 10/21/2020 02/03/2020 01/28/2020  Nervous, Anxious, on Edge 1 1 0 3  Control/stop worrying 1 1 1 3   Worry too much - different things 0 1 1 3   Trouble relaxing 1 1 1 3   Restless 0 0 1 3  Easily annoyed or irritable 1 1 0 3  Afraid - awful might happen 0 0 0 3  Total GAD 7 Score 4 5 4 21   Anxiety Difficulty - - Somewhat difficult Very difficult     Review of Systems:   Pertinent items are noted in HPI Denies abnormal vaginal discharge w/ itching/odor/irritation, headaches, visual changes, shortness of breath, chest pain, abdominal pain, severe nausea/vomiting, or  problems with urination or bowel movements unless otherwise stated above. Pertinent History Reviewed:  Reviewed past medical,surgical, social, obstetrical and family history.  Reviewed problem list, medications and allergies. Physical Assessment:   Vitals:   03/21/21 1358  BP: (!) 140/91  Pulse: (!) 109  Weight: 208 lb 8 oz (94.6 kg)  Body mass index is 35.79 kg/m.           Physical Examination:   General appearance: alert, well appearing, and in no distress  Mental status: alert, oriented to person, place, and time  Skin: warm & dry   Extremities: Edema: Trace    Cardiovascular: normal heart rate noted  Respiratory: normal respiratory effort, no distress  Abdomen: gravid, soft, non-tender  Pelvic: Cervical exam deferred         Fetal Status: Fetal Heart Rate (bpm): 125   Movement: Present    Fetal Surveillance Testing today: NST: FHR baseline 125 bpm, Variability: moderate, Accelerations:present, Decelerations:  Absent= Cat 1/reactive Toco: none    Chaperone: N/A    No results found for this or any previous visit (from the past 24 hour(s)).  Assessment & Plan:  High-risk pregnancy: at [redacted]w[redacted]d with an Estimated Date of Delivery: 05/02/21   1) GHTN, last labs 10/16, repeat today  2) Itching  palms/legs, worse at night, return in am for fasting bile acids/cmp  3) H/O 35wk PTB> weekly Makena  Meds: No orders of the defined types were placed in this encounter.   Labs/procedures today: NST and labs, Makena  Treatment Plan:  Growth u/s q 4wks     2x/wk testing nst alt w/ bpp/dopp @ 32wks      Deliver @ 37-37.6wks:___   Reviewed: Preterm labor symptoms and general obstetric precautions including but not limited to vaginal bleeding, contractions, leaking of fluid and fetal movement were reviewed in detail with the patient.  All questions were answered. Does have home bp cuff. Office bp cuff given: not applicable. Check bp daily, let us know if consistently >150 and/or  >95.  Follow-up: Return for tomorrow am for labs; this thurs bpp/dopp; mon nst/nurse; thurs bpp/dopp until 11/14.   Future Appointments  Date Time Provider Department Center  03/22/2021  9:30 AM [facility]  03/28/2021  9:50 AM [facility] NURSE [facility]  04/04/2021  9:50 AM [facility] NURSE [facility]    Orders Placed This Encounter  Procedures   CBC   Comprehensive metabolic panel   Protein / creatinine ratio, urine   Bile acids, total   Cheral Marker CNM, [facility] 03/21/2021 2:50 PM

## 2021-03-21 NOTE — Telephone Encounter (Signed)
Transition Care Management Follow-up Telephone Call Date of discharge and from where: 03/20/2021-[facility]  How have you been since you were released from the hospital? Patient stated she is doing fine. Any questions or concerns? No  Items Reviewed: Did the pt receive and understand the discharge instructions provided? Yes  Medications obtained and verified?  No medications given at discharge Other? No  Any new allergies since your discharge? Yes  Dietary orders reviewed? No Do you have support at home? Yes   Home Care and Equipment/Supplies: Were home health services ordered? not applicable If so, what is the name of the agency? N/A  Has the agency set up a time to come to the patient's home? not applicable Were any new equipment or medical supplies ordered?  No What is the name of the medical supply agency? N/A Were you able to get the supplies/equipment? not applicable Do you have any questions related to the use of the equipment or supplies? No  Functional Questionnaire: (I = Independent and D = Dependent) ADLs: I  Bathing/Dressing- I  Meal Prep- I  Eating- I  Maintaining continence- I  Transferring/Ambulation- I  Managing Meds- I  Follow up appointments reviewed:  PCP Hospital f/u appt confirmed? No  Specialist Hospital f/u appt confirmed? Yes  Scheduled to see OBGYN on 03/21/2021 @ 1:50PM. Are transportation arrangements needed? No  If their condition worsens, is the pt aware to call PCP or go to the Emergency Dept.? Yes Was the patient provided with contact information for the PCP's office or ED? Yes Was to pt encouraged to call back with questions or concerns? Yes

## 2021-03-21 NOTE — Addendum Note (Signed)
Addended by: Colen Darling on: 03/21/2021 03:36 PM   Modules accepted: Orders

## 2021-03-21 NOTE — Patient Instructions (Signed)
Ann Moore, thank you for choosing our office today! We appreciate the opportunity to meet your healthcare needs. You may receive a short survey by mail, e-mail, or through [employer]. If you are happy with your care we would appreciate if you could take just a few minutes to complete the survey questions. We read all of your comments and take your feedback very seriously. Thank you again for choosing our office.  Center for [employer] Team at [place] at [facility] ([place], [place] [address]) Entrance C, located off of [address] parking   CLASSES: Go to [email or web address] to register for classes (childbirth, breastfeeding, waterbirth, infant CPR, daddy bootcamp, etc.)  Call the office [phone]) or go to [facility] if: You begin to have strong, frequent contractions Your water breaks.  Sometimes it is a big gush of fluid, sometimes it is just a trickle that keeps getting your panties wet or running down your legs You have vaginal bleeding.  It is normal to have a small amount of spotting if your cervix was checked.  You don't feel your baby moving like normal.  If you don't, get you something to eat and drink and lay down and focus on feeling your baby move.   If your baby is still not moving like normal, you should call the office or go to [facility].  Call the office [phone]) or go to [facility] hospital for these signs of pre-eclampsia: Severe headache that does not go away with Tylenol Visual changes- seeing spots, double, blurred vision Pain under your right breast or upper abdomen that does not go away with Tums or heartburn medicine Nausea and/or vomiting Severe swelling in your hands, feet, and face   Tdap Vaccine It is recommended that you get the Tdap vaccine during the third trimester of EACH pregnancy to help protect your baby from getting pertussis (whooping cough) 27-36 weeks is the BEST time to do  this so that you can pass the protection on to your baby. During pregnancy is better than after pregnancy, but if you are unable to get it during pregnancy it will be offered at the hospital.  You can get this vaccine with [place], at the health department, your family doctor, or some local pharmacies Everyone who will be around your baby should also be up-to-date on their vaccines before the baby comes. Adults (who are not pregnant) only need 1 dose of Tdap during adulthood.   [facility] Pediatricians/Family Doctors [facility]): [address] [place], [facility]: [address] [place], [phone]                [facility]): [place], [phone] (call to ask if accepting patients) [facility] Department: [address], [place], [place] Pediatricians/Family Doctors [facility]): [place]. [place], [address], [place] [facility]: [address] [place], [place] [facility]: [place], [place] [facility]): [phone] [facility]: [address], [place]: [address] [place] [facility]: [phone], [phone]  Home Blood Pressure Monitoring for Patients   Your provider has recommended that you check your  blood pressure (BP) at least once a week at home. If you do not have a blood pressure cuff at home, one will be provided for you. Contact your provider if you have not received your monitor within 1 week.   Helpful Tips for Accurate Home Blood Pressure Checks  Don't smoke, exercise, or drink caffeine 30 minutes before checking your BP Use the restroom before checking your BP (a full bladder can raise your  pressure) Relax in a comfortable upright chair Feet on the ground Left arm resting comfortably on a flat surface at the level of your heart Legs uncrossed Back supported Sit quietly and don't talk Place the cuff on your bare arm Adjust snuggly, so that only two fingertips can fit between your skin and the top of the cuff Check 2 readings separated by at least one minute Keep a log of your BP readings For a visual, please reference this diagram: http://ccnc.care/bpdiagram  Provider Name: [facility]     Phone: [phone]  Zone 1: ALL CLEAR  Continue to monitor your symptoms:  BP reading is less than 140 (top number) or less than 90 (bottom number)  No right upper stomach pain No headaches or seeing spots No feeling nauseated or throwing up No swelling in face and hands  Zone 2: CAUTION Call your doctor's office for any of the following:  BP reading is greater than 140 (top number) or greater than 90 (bottom number)  Stomach pain under your ribs in the middle or right side Headaches or seeing spots Feeling nauseated or throwing up Swelling in face and hands  Zone 3: EMERGENCY  Seek immediate medical care if you have any of the following:  BP reading is greater than160 (top number) or greater than 110 (bottom number) Severe headaches not improving with Tylenol Serious difficulty catching your breath Any worsening symptoms from Zone 2  Preterm Labor and Birth Information  The normal length of a pregnancy is 39-41 weeks. Preterm labor is when labor starts before 37 completed weeks of pregnancy. What are the risk factors for preterm labor? Preterm labor is more likely to occur in women who: Have certain infections during pregnancy such as a bladder infection, sexually transmitted infection, or infection inside the uterus (chorioamnionitis). Have a shorter-than-normal cervix. Have gone into preterm labor before. Have had surgery on their cervix. Are younger than age 17  or older than age 35. Are African American. Are pregnant with twins or multiple babies (multiple gestation). Take street drugs or smoke while pregnant. Do not gain enough weight while pregnant. Became pregnant shortly after having been pregnant. What are the symptoms of preterm labor? Symptoms of preterm labor include: Cramps similar to those that can happen during a menstrual period. The cramps may happen with diarrhea. Pain in the abdomen or lower back. Regular uterine contractions that may feel like tightening of the abdomen. A feeling of increased pressure in the pelvis. Increased watery or bloody mucus discharge from the vagina. Water breaking (ruptured amniotic sac). Why is it important to recognize signs of preterm labor? It is important to recognize signs of preterm labor because babies who are born prematurely may not be fully developed. This can put them at an increased risk for: Long-term (chronic) heart and lung problems. Difficulty immediately after birth with regulating body systems, including blood sugar, body temperature, heart rate, and breathing rate. Bleeding in the brain. Cerebral palsy. Learning difficulties. Death. These risks are highest for babies who are born before 34 weeks   of pregnancy. How is preterm labor treated? Treatment depends on the length of your pregnancy, your condition, and the health of your baby. It may involve: Having a stitch (suture) placed in your cervix to prevent your cervix from opening too early (cerclage). Taking or being given medicines, such as: Hormone medicines. These may be given early in pregnancy to help support the pregnancy. Medicine to stop contractions. Medicines to help mature the baby's lungs. These may be prescribed if the risk of delivery is high. Medicines to prevent your baby from developing cerebral palsy. If the labor happens before 34 weeks of pregnancy, you may need to stay in the hospital. What should I do if I  think I am in preterm labor? If you think that you are going into preterm labor, call your health care provider right away. How can I prevent preterm labor in future pregnancies? To increase your chance of having a full-term pregnancy: Do not use any tobacco products, such as cigarettes, chewing tobacco, and e-cigarettes. If you need help quitting, ask your health care provider. Do not use street drugs or medicines that have not been prescribed to you during your pregnancy. Talk with your health care provider before taking any herbal supplements, even if you have been taking them regularly. Make sure you gain a healthy amount of weight during your pregnancy. Watch for infection. If you think that you might have an infection, get it checked right away. Make sure to tell your health care provider if you have gone into preterm labor before. This information is not intended to replace advice given to you by your health care provider. Make sure you discuss any questions you have with your health care provider. Document Revised: 09/06/2018 Document Reviewed: 10/06/2015 Elsevier Patient Education  2020 Elsevier Inc.   

## 2021-03-22 ENCOUNTER — Other Ambulatory Visit: Payer: Medicaid Other

## 2021-03-22 ENCOUNTER — Other Ambulatory Visit: Payer: Self-pay | Admitting: Women's Health

## 2021-03-22 DIAGNOSIS — O133 Gestational [pregnancy-induced] hypertension without significant proteinuria, third trimester: Secondary | ICD-10-CM | POA: Diagnosis not present

## 2021-03-22 DIAGNOSIS — O0993 Supervision of high risk pregnancy, unspecified, third trimester: Secondary | ICD-10-CM | POA: Diagnosis not present

## 2021-03-22 DIAGNOSIS — L299 Pruritus, unspecified: Secondary | ICD-10-CM | POA: Diagnosis not present

## 2021-03-23 ENCOUNTER — Other Ambulatory Visit: Payer: Self-pay | Admitting: Women's Health

## 2021-03-23 DIAGNOSIS — O133 Gestational [pregnancy-induced] hypertension without significant proteinuria, third trimester: Secondary | ICD-10-CM

## 2021-03-24 ENCOUNTER — Ambulatory Visit (INDEPENDENT_AMBULATORY_CARE_PROVIDER_SITE_OTHER): Payer: Medicaid Other

## 2021-03-24 ENCOUNTER — Encounter: Payer: Self-pay | Admitting: Obstetrics & Gynecology

## 2021-03-24 ENCOUNTER — Other Ambulatory Visit: Payer: Self-pay

## 2021-03-24 ENCOUNTER — Ambulatory Visit (INDEPENDENT_AMBULATORY_CARE_PROVIDER_SITE_OTHER): Payer: Medicaid Other | Admitting: Obstetrics & Gynecology

## 2021-03-24 ENCOUNTER — Other Ambulatory Visit: Payer: Medicaid Other

## 2021-03-24 VITALS — BP 128/84 | HR 94 | Wt 204.8 lb

## 2021-03-24 DIAGNOSIS — O133 Gestational [pregnancy-induced] hypertension without significant proteinuria, third trimester: Secondary | ICD-10-CM

## 2021-03-24 DIAGNOSIS — Z3A34 34 weeks gestation of pregnancy: Secondary | ICD-10-CM | POA: Diagnosis not present

## 2021-03-24 DIAGNOSIS — Z3480 Encounter for supervision of other normal pregnancy, unspecified trimester: Secondary | ICD-10-CM

## 2021-03-24 DIAGNOSIS — Z302 Encounter for sterilization: Secondary | ICD-10-CM

## 2021-03-24 DIAGNOSIS — O99891 Other specified diseases and conditions complicating pregnancy: Secondary | ICD-10-CM

## 2021-03-24 DIAGNOSIS — O0993 Supervision of high risk pregnancy, unspecified, third trimester: Secondary | ICD-10-CM

## 2021-03-24 DIAGNOSIS — R8271 Bacteriuria: Secondary | ICD-10-CM

## 2021-03-24 LAB — POCT URINALYSIS DIPSTICK OB
Blood, UA: NEGATIVE
Glucose, UA: NEGATIVE
Ketones, UA: NEGATIVE
Leukocytes, UA: NEGATIVE
Nitrite, UA: NEGATIVE
POC,PROTEIN,UA: NEGATIVE

## 2021-03-24 LAB — BILE ACIDS, TOTAL: Bile Acids Total: 1 umol/L (ref 0.0–10.0)

## 2021-03-24 LAB — PROTEIN / CREATININE RATIO, URINE

## 2021-03-24 NOTE — Progress Notes (Signed)
[place] 34+3 wks,cephalic,BPP 8/8,FHR 142 bpm,mild left renal pelvic dilatation 7.1 mm,right renal pelvis 5.7 mm WNL,anterior placenta gr 0,AFI 13.9 cm,RI .53,.57,.52=35%

## 2021-03-24 NOTE — Progress Notes (Signed)
HIGH-RISK PREGNANCY VISIT Patient name: Ann Moore MRN [id number]  Date of birth: 28-Jan-1991 Chief Complaint:   Routine Prenatal Visit, High Risk Gestation, and Pregnancy Ultrasound  History of Present Illness:   Ann Moore is a 30 y.o. [id number] female at 108w3d with an Estimated Date of Delivery: 05/02/21 being seen today for ongoing management of a high-risk pregnancy complicated by:  -Gestational HTN- currently asymptomatic -h/o PTD- s/p Makena weekly.    Today she reports no complaints.   Contractions: Not present. Vag. Bleeding: None.  Movement: Present. denies leaking of fluid.   Depression screen [facility] 2/9 01/26/2021 10/21/2020 02/03/2020 01/28/2020 12/23/2019  Decreased Interest 0 1 1 1 2   Down, Depressed, Hopeless 0 0 0 2 0  PHQ - 2 Score 0 1 1 3 2   Altered sleeping 1 1 1 2 3   Tired, decreased energy 1 1 1 3 3   Change in appetite 0 0 0 1 3  Feeling bad or failure about yourself  0 0 0 1 1  Trouble concentrating 0 1 0 3 2  Moving slowly or fidgety/restless 0 0 1 3 3   Suicidal thoughts 0 0 0 0 0  PHQ-9 Score 2 4 4 16 17   Difficult doing work/chores - - Somewhat difficult Very difficult Very difficult  Some recent data might be hidden     Current Outpatient Medications  Medication Instructions   acetaminophen (TYLENOL) 650 mg, Oral, Every 6 hours PRN   Blood Pressure Monitor MISC For regular home bp monitoring during pregnancy   MAKENA autoinjector No dose, route, or frequency recorded.   mupirocin ointment (BACTROBAN) 2 % 1 application, Topical, 2 times daily   ondansetron (ZOFRAN ODT) 8 mg, Oral, Every 8 hours PRN   Prenatal Vit-Fe Fumarate-FA (PRENATAL VITAMIN PO) Oral   promethazine (PHENERGAN) 12.5-25 mg, Oral, Every 6 hours PRN     Review of Systems:   Pertinent items are noted in HPI Denies abnormal vaginal discharge w/ itching/odor/irritation, headaches, visual changes, shortness of breath, chest pain, abdominal pain, severe nausea/vomiting, or problems  with urination or bowel movements unless otherwise stated above. Pertinent History Reviewed:  Reviewed past medical,surgical, social, obstetrical and family history.  Reviewed problem list, medications and allergies. Physical Assessment:   Vitals:   03/24/21 1036  BP: 128/84  Pulse: 94  Weight: 204 lb 12.8 oz (92.9 kg)  Body mass index is 35.15 kg/m.           Physical Examination:   General appearance: alert, well appearing, and in no distress  Mental status: alert, oriented to person, place, and time  Skin: warm & dry   Extremities: Edema: Trace    Cardiovascular: normal heart rate noted  Respiratory: normal respiratory effort, no distress  Abdomen: gravid, soft, non-tender  Pelvic: Cervical exam deferred         Fetal Status:     Movement: Present    Fetal Surveillance Testing today: cephalic,BPP 8/8,FHR 142 bpm,mild left renal pelvic dilatation 7.1 mm,right renal pelvis 5.7 mm WNL,anterior placenta gr 0,AFI 13.9 cm,RI .53,.57,.52=35%   Chaperone: N/A    Results for orders placed or performed in visit on 03/24/21 (from the past 24 hour(s))  POC Urinalysis Dipstick OB   Collection Time: 03/24/21 10:38 AM  Result Value Ref Range   Color, UA     Clarity, UA     Glucose, UA Negative Negative   Bilirubin, UA     Ketones, UA neg    Spec Grav, UA  Blood, UA neg    pH, UA     POC,PROTEIN,UA Negative Negative, Trace, Small (1+), Moderate (2+), Large (3+), 4+   Urobilinogen, UA     Nitrite, UA neg    Leukocytes, UA Negative Negative   Appearance     Odor       Assessment & Plan:  High-risk pregnancy: [id number] at [redacted]w[redacted]d with an Estimated Date of Delivery: 05/02/21   1) Gest HTN BP within normal range at home (highest 130s/80s) -asymptomatic -PC ratio collected today -continue twice weekly testing []  IOL 37wks  2) Renal pelvic dilation -next growth @ 36wks  Meds: No orders of the defined types were placed in this encounter.   Labs/procedures today:  BPP  Treatment Plan:  continue with plan as outlined above  Reviewed: Preterm labor symptoms and general obstetric precautions including but not limited to vaginal bleeding, contractions, leaking of fluid and fetal movement were reviewed in detail with the patient.  All questions were answered. PT has home bp cuff. Check bp weekly, let know if >140/90.   Follow-up: No follow-ups on file.   Future Appointments  Date Time Provider Department Center  03/24/2021 12:15 PM 03/26/2021, DO [facility]  03/29/2021  2:50 PM [facility] NURSE [facility]  04/01/2021 11:45 AM [facility] 13/08/2020 [facility] None  04/01/2021 12:30 PM 13/08/2020, MD [facility]  04/05/2021  2:50 PM [facility] NURSE [facility]  04/08/2021 12:00 PM [facility] 13/03/2021 [facility] None    Orders Placed This Encounter  Procedures   POC Urinalysis Dipstick OB    [place], DO Attending Obstetrician & Gynecologist, [facility], [facility]

## 2021-03-25 LAB — PROTEIN / CREATININE RATIO, URINE
Creatinine, Urine: 41.9 mg/dL
Protein, Ur: 12.3 mg/dL
Protein/Creat Ratio: 294 mg/g creat — ABNORMAL HIGH (ref 0–200)

## 2021-03-26 LAB — COMPREHENSIVE METABOLIC PANEL
ALT: 14 IU/L (ref 0–32)
AST: 19 IU/L (ref 0–40)
Albumin/Globulin Ratio: 1.6 (ref 1.2–2.2)
Albumin: 3.7 g/dL — ABNORMAL LOW (ref 3.9–5.0)
Alkaline Phosphatase: 139 IU/L — ABNORMAL HIGH (ref 44–121)
BUN/Creatinine Ratio: 10 (ref 9–23)
BUN: 5 mg/dL — ABNORMAL LOW (ref 6–20)
Bilirubin Total: 0.3 mg/dL (ref 0.0–1.2)
CO2: 20 mmol/L (ref 20–29)
Calcium: 8.8 mg/dL (ref 8.7–10.2)
Chloride: 105 mmol/L (ref 96–106)
Creatinine, Ser: 0.5 mg/dL — ABNORMAL LOW (ref 0.57–1.00)
Globulin, Total: 2.3 g/dL (ref 1.5–4.5)
Glucose: 74 mg/dL (ref 70–99)
Potassium: 4.2 mmol/L (ref 3.5–5.2)
Sodium: 138 mmol/L (ref 134–144)
Total Protein: 6 g/dL (ref 6.0–8.5)
eGFR: 130 mL/min/{1.73_m2} (ref >59)

## 2021-03-26 LAB — CBC
Hematocrit: 32.7 % — ABNORMAL LOW (ref 34.0–46.6)
Hemoglobin: 10.7 g/dL — ABNORMAL LOW (ref 11.1–15.9)
MCH: 27.6 pg (ref 26.6–33.0)
MCHC: 32.7 g/dL (ref 31.5–35.7)
MCV: 85 fL (ref 79–97)
Platelets: 184 10*3/uL (ref 150–450)
RBC: 3.87 x10E6/uL (ref 3.77–5.28)
RDW: 13.1 % (ref 11.7–15.4)
WBC: 8.5 10*3/uL (ref 3.4–10.8)

## 2021-03-26 LAB — BILE ACIDS, TOTAL

## 2021-03-26 LAB — PROTEIN / CREATININE RATIO, URINE

## 2021-03-28 ENCOUNTER — Ambulatory Visit: Payer: Medicaid Other

## 2021-03-28 MED ORDER — FERROUS SULFATE 325 (65 FE) MG PO TABS: 325.0000 mg | ORAL_TABLET | ORAL | 2 refills | Status: DC

## 2021-03-28 NOTE — Addendum Note (Signed)
Addended by: Shawna Clamp R on: 03/28/2021 10:04 AM   Modules accepted: Orders

## 2021-03-29 ENCOUNTER — Other Ambulatory Visit: Payer: Self-pay

## 2021-03-29 ENCOUNTER — Ambulatory Visit (INDEPENDENT_AMBULATORY_CARE_PROVIDER_SITE_OTHER): Payer: Medicaid Other | Admitting: *Deleted

## 2021-03-29 DIAGNOSIS — O0993 Supervision of high risk pregnancy, unspecified, third trimester: Secondary | ICD-10-CM

## 2021-03-29 DIAGNOSIS — O139 Gestational [pregnancy-induced] hypertension without significant proteinuria, unspecified trimester: Secondary | ICD-10-CM | POA: Insufficient documentation

## 2021-03-29 DIAGNOSIS — Z3A35 35 weeks gestation of pregnancy: Secondary | ICD-10-CM | POA: Diagnosis not present

## 2021-03-29 DIAGNOSIS — O133 Gestational [pregnancy-induced] hypertension without significant proteinuria, third trimester: Secondary | ICD-10-CM | POA: Diagnosis not present

## 2021-03-29 NOTE — Progress Notes (Addendum)
   NURSE VISIT- NST  SUBJECTIVE:  Ann Moore is a 30 y.o. [phone] female at [redacted]w[redacted]d, here for a NST for pregnancy complicated by [facility].  She reports active fetal movement, contractions: none, vaginal bleeding: none, membranes: intact.   OBJECTIVE:  BP 128/88   Pulse (!) 111   Wt 209 lb (94.8 kg)   LMP 07/26/2020 (Exact Date)   BMI 35.87 kg/m   Appears well, no apparent distress  No results found for this or any previous visit (from the past 24 hour(s)).  NST: FHR baseline 125 bpm, Variability: moderate, Accelerations:present, Decelerations:  Absent= Cat 1/reactive Toco: occasional   ASSESSMENT: [id number] at [redacted]w[redacted]d with GHTN NST reactive  PLAN: EFM strip reviewed by Joellyn Haff, CNM, [facility]   Recommendations: keep next appointment as scheduled    Jobe Marker  03/29/2021 3:48 PM  Chart reviewed for nurse visit. Agree with plan of care.  Cheral Marker, [place] 03/29/2021 4:14 PM

## 2021-03-31 ENCOUNTER — Other Ambulatory Visit: Payer: Self-pay | Admitting: Obstetrics & Gynecology

## 2021-03-31 DIAGNOSIS — O133 Gestational [pregnancy-induced] hypertension without significant proteinuria, third trimester: Secondary | ICD-10-CM

## 2021-04-01 ENCOUNTER — Encounter: Payer: Self-pay | Admitting: Obstetrics & Gynecology

## 2021-04-01 ENCOUNTER — Ambulatory Visit (INDEPENDENT_AMBULATORY_CARE_PROVIDER_SITE_OTHER): Payer: Medicaid Other | Admitting: Obstetrics & Gynecology

## 2021-04-01 ENCOUNTER — Ambulatory Visit (INDEPENDENT_AMBULATORY_CARE_PROVIDER_SITE_OTHER): Payer: Medicaid Other

## 2021-04-01 ENCOUNTER — Other Ambulatory Visit (HOSPITAL_COMMUNITY)
Admission: RE | Admit: 2021-04-01 | Discharge: 2021-04-01 | Disposition: A | Discharge status: Home / Self Care | Payer: Medicaid Other | Source: Ambulatory Visit | Attending: Obstetrics & Gynecology | Admitting: Obstetrics & Gynecology

## 2021-04-01 ENCOUNTER — Other Ambulatory Visit: Payer: Self-pay

## 2021-04-01 VITALS — BP 132/86 | HR 90 | Wt 205.5 lb

## 2021-04-01 DIAGNOSIS — O133 Gestational [pregnancy-induced] hypertension without significant proteinuria, third trimester: Secondary | ICD-10-CM

## 2021-04-01 DIAGNOSIS — Z3A35 35 weeks gestation of pregnancy: Secondary | ICD-10-CM

## 2021-04-01 DIAGNOSIS — R8271 Bacteriuria: Secondary | ICD-10-CM

## 2021-04-01 DIAGNOSIS — O0993 Supervision of high risk pregnancy, unspecified, third trimester: Secondary | ICD-10-CM

## 2021-04-01 DIAGNOSIS — O99891 Other specified diseases and conditions complicating pregnancy: Secondary | ICD-10-CM

## 2021-04-01 DIAGNOSIS — Z302 Encounter for sterilization: Secondary | ICD-10-CM

## 2021-04-01 LAB — OB RESULTS CONSOLE GC/CHLAMYDIA: Gonorrhea: NEGATIVE

## 2021-04-01 NOTE — Progress Notes (Signed)
[place] 35+4 wks,cephalic,BPP 8/8,FHR 152 bpm,cx 3.3 cm,anterior placenta gr 1,mild left renal pelvic dilatation 7.5 mm,RI .48,,52,.47,.62=30%,AFI 14.6 cm

## 2021-04-01 NOTE — Progress Notes (Signed)
HIGH-RISK PREGNANCY VISIT Patient name: Ann Moore MRN [id number]  Date of birth: 1991-02-08 Chief Complaint:   Routine Prenatal Visit (BPP)  History of Present Illness:   Ann Moore is a 30 y.o. [id number] female at [redacted]w[redacted]d with an Estimated Date of Delivery: 05/02/21 being seen today for ongoing management of a high-risk pregnancy complicated by gestational hypertension currently on no meds.    Today she reports no complaints. Contractions: Irritability. Vag. Bleeding: None.  Movement: Present. denies leaking of fluid.   Depression screen [facility] 2/9 01/26/2021 10/21/2020 02/03/2020 01/28/2020 12/23/2019  Decreased Interest 0 1 1 1 2   Down, Depressed, Hopeless 0 0 0 2 0  PHQ - 2 Score 0 1 1 3 2   Altered sleeping 1 1 1 2 3   Tired, decreased energy 1 1 1 3 3   Change in appetite 0 0 0 1 3  Feeling bad or failure about yourself  0 0 0 1 1  Trouble concentrating 0 1 0 3 2  Moving slowly or fidgety/restless 0 0 1 3 3   Suicidal thoughts 0 0 0 0 0  PHQ-9 Score 2 4 4 16 17   Difficult doing work/chores - - Somewhat difficult Very difficult Very difficult  Some recent data might be hidden     GAD 7 : Generalized Anxiety Score 01/26/2021 10/21/2020 02/03/2020 01/28/2020  Nervous, Anxious, on Edge 1 1 0 3  Control/stop worrying 1 1 1 3   Worry too much - different things 0 1 1 3   Trouble relaxing 1 1 1 3   Restless 0 0 1 3  Easily annoyed or irritable 1 1 0 3  Afraid - awful might happen 0 0 0 3  Total GAD 7 Score 4 5 4 21   Anxiety Difficulty - - Somewhat difficult Very difficult     Review of Systems:   Pertinent items are noted in HPI Denies abnormal vaginal discharge w/ itching/odor/irritation, headaches, visual changes, shortness of breath, chest pain, abdominal pain, severe nausea/vomiting, or problems with urination or bowel movements unless otherwise stated above. Pertinent History Reviewed:  Reviewed past medical,surgical, social, obstetrical and family history.  Reviewed problem list,  medications and allergies. Physical Assessment:   Vitals:   04/01/21 1155  BP: 132/86  Pulse: 90  Weight: 205 lb 8 oz (93.2 kg)  Body mass index is 35.27 kg/m.           Physical Examination:   General appearance: alert, well appearing, and in no distress  Mental status: alert, oriented to person, place, and time  Skin: warm & dry   Extremities: Edema: Trace    Cardiovascular: normal heart rate noted  Respiratory: normal respiratory effort, no distress  Abdomen: gravid, soft, non-tender  Pelvic: Cervical exam performed  Dilation: 1 Effacement (%): 20 Station: -3  Fetal Status:     Movement: Present    Fetal Surveillance Testing today: sonogram BPP 8/8 normal Dopplers   Chaperone: Latisha Cresenzo    No results found for this or any previous visit (from the past 24 hour(s)).  Assessment & Plan:  High-risk pregnancy: [id number] at [redacted]w[redacted]d with an Estimated Date of Delivery: 05/02/21   1) GHTN, stable    Meds: No orders of the defined types were placed in this encounter.   Labs/procedures today: U/S  Treatment Plan:  twice weekly surveillance IOL 37 weeks  Reviewed: Preterm labor symptoms and general obstetric precautions including but not limited to vaginal bleeding, contractions, leaking of fluid and fetal movement were reviewed in detail  with the patient.  All questions were answered. Does have home bp cuff. Office bp cuff given: not applicable. Check bp daily, let us know if consistently >150 and/or >95.  Follow-up: Return for keep scheduled.   Future Appointments  Date Time Provider Department Center  04/05/2021  3:10 PM Lazaro Arms, MD [facility]  04/08/2021 12:00 PM [facility] [place] [facility] None    Orders Placed This Encounter  Procedures   Culture, beta strep (group b only)   POC Urinalysis Dipstick OB   Amaryllis Dyke Takyah Ciaramitaro  04/01/2021 1:01 PM

## 2021-04-04 ENCOUNTER — Telehealth: Payer: Self-pay | Admitting: Women's Health

## 2021-04-04 ENCOUNTER — Ambulatory Visit: Payer: Medicaid Other

## 2021-04-04 LAB — CERVICOVAGINAL ANCILLARY ONLY
Chlamydia: NEGATIVE
Comment: NEGATIVE
Comment: NORMAL
Neisseria Gonorrhea: NEGATIVE

## 2021-04-04 NOTE — Telephone Encounter (Signed)
Pt has a BPP scheduled for 04/09/21 but her grandmother passed away & the funeral is April 09, 2021 - pt wonders if we could do her U/S another day this week  Please advise & notify pt

## 2021-04-04 NOTE — Telephone Encounter (Signed)
Reviewed with Dr Despina Hidden.  Will have patient come on Thursday.

## 2021-04-05 ENCOUNTER — Other Ambulatory Visit: Payer: Self-pay

## 2021-04-05 ENCOUNTER — Other Ambulatory Visit: Payer: Medicaid Other

## 2021-04-05 ENCOUNTER — Encounter: Payer: Self-pay | Admitting: Obstetrics & Gynecology

## 2021-04-05 ENCOUNTER — Other Ambulatory Visit: Payer: Self-pay | Admitting: Obstetrics & Gynecology

## 2021-04-05 ENCOUNTER — Ambulatory Visit (INDEPENDENT_AMBULATORY_CARE_PROVIDER_SITE_OTHER): Payer: Medicaid Other | Admitting: Obstetrics & Gynecology

## 2021-04-05 VITALS — BP 124/85 | HR 115 | Wt 209.0 lb

## 2021-04-05 DIAGNOSIS — Z1389 Encounter for screening for other disorder: Secondary | ICD-10-CM

## 2021-04-05 DIAGNOSIS — O133 Gestational [pregnancy-induced] hypertension without significant proteinuria, third trimester: Secondary | ICD-10-CM

## 2021-04-05 DIAGNOSIS — Z331 Pregnant state, incidental: Secondary | ICD-10-CM

## 2021-04-05 DIAGNOSIS — Z3A36 36 weeks gestation of pregnancy: Secondary | ICD-10-CM

## 2021-04-05 DIAGNOSIS — O0993 Supervision of high risk pregnancy, unspecified, third trimester: Secondary | ICD-10-CM

## 2021-04-05 LAB — CULTURE, BETA STREP (GROUP B ONLY): Strep Gp B Culture: NEGATIVE

## 2021-04-05 NOTE — Treatment Plan (Signed)
   Induction Assessment Scheduling Form: Fax to [facility]:  [phone]  Ann Moore                                                                                   DOB:  1991-05-08                                                            MRN:  [id number]                                                                     Phone #:   [phone]                         Provider:  [facility]  GP:  [id number]                                                            Estimated Date of Delivery: 05/02/21  Dating Criteria: early sonogram    Medical Indications for induction:  GHTN Admission Date/Time:  04/11/21 AM Gestational age on admission:  [redacted]w[redacted]d   Filed Weights   04/05/21 1513  Weight: 209 lb (94.8 kg)   HIV:  Non Reactive (08/31 0841) GBS: Negative/-- (11/04 1349)  Cervix not examined   Method of induction(proposed):  choice   Scheduling Provider Signature:  Lazaro Arms, MD                                            Today's Date:  04/05/2021

## 2021-04-05 NOTE — Progress Notes (Signed)
HIGH-RISK PREGNANCY VISIT Patient name: Ann Moore MRN [id number]  Date of birth: 1990-07-08 Chief Complaint:   Routine Prenatal Visit (NST today)  History of Present Illness:   Ann Moore is a 30 y.o. [id number] female at [redacted]w[redacted]d with an Estimated Date of Delivery: 05/02/21 being seen today for ongoing management of a high-risk pregnancy complicated by gestational hypertension currently on no meds.    Today she reports no complaints. Contractions: Irritability.  .  Movement: Present. denies leaking of fluid.   Depression screen [facility] 2/9 01/26/2021 10/21/2020 02/03/2020 01/28/2020 12/23/2019  Decreased Interest 0 1 1 1 2   Down, Depressed, Hopeless 0 0 0 2 0  PHQ - 2 Score 0 1 1 3 2   Altered sleeping 1 1 1 2 3   Tired, decreased energy 1 1 1 3 3   Change in appetite 0 0 0 1 3  Feeling bad or failure about yourself  0 0 0 1 1  Trouble concentrating 0 1 0 3 2  Moving slowly or fidgety/restless 0 0 1 3 3   Suicidal thoughts 0 0 0 0 0  PHQ-9 Score 2 4 4 16 17   Difficult doing work/chores - - Somewhat difficult Very difficult Very difficult  Some recent data might be hidden     GAD 7 : Generalized Anxiety Score 01/26/2021 10/21/2020 02/03/2020 01/28/2020  Nervous, Anxious, on Edge 1 1 0 3  Control/stop worrying 1 1 1 3   Worry too much - different things 0 1 1 3   Trouble relaxing 1 1 1 3   Restless 0 0 1 3  Easily annoyed or irritable 1 1 0 3  Afraid - awful might happen 0 0 0 3  Total GAD 7 Score 4 5 4 21   Anxiety Difficulty - - Somewhat difficult Very difficult     Review of Systems:   Pertinent items are noted in HPI Denies abnormal vaginal discharge w/ itching/odor/irritation, headaches, visual changes, shortness of breath, chest pain, abdominal pain, severe nausea/vomiting, or problems with urination or bowel movements unless otherwise stated above. Pertinent History Reviewed:  Reviewed past medical,surgical, social, obstetrical and family history.  Reviewed problem list, medications  and allergies. Physical Assessment:   Vitals:   04/05/21 1513  BP: 124/85  Pulse: (!) 115  Weight: 209 lb (94.8 kg)  Body mass index is 35.87 kg/m.           Physical Examination:   General appearance: alert, well appearing, and in no distress  Mental status: alert, oriented to person, place, and time  Skin: warm & dry   Extremities: Edema: None    Cardiovascular: normal heart rate noted  Respiratory: normal respiratory effort, no distress  Abdomen: gravid, soft, non-tender  Pelvic: Cervical exam deferred         Fetal Status:     Movement: Present    Fetal Surveillance Testing today: Reactive NST  Ann Moore is at [redacted]w[redacted]d Estimated Date of Delivery: 05/02/21  NST being performed due to [facility]  Today the NST is Reactive  Fetal Monitoring:  Baseline: 140 bpm, Variability: Good {> 6 bpm), Accelerations: Reactive, and Decelerations: Absent   reactive  The accelerations are >15 bpm and more than 2 in 20 minutes  Final diagnosis:  Reactive NST  03/29/2020, MD     Chaperone: N/A    No results found for this or any previous visit (from the past 24 hour(s)).  Assessment & Plan:  High-risk pregnancy: at [redacted]w[redacted]d with an Estimated Date  of Delivery: 05/02/21   1) GHTN, IOL next Monday 04/11/21, stable, BPP later this week    Meds: No orders of the defined types were placed in this encounter.   Labs/procedures today: NST  Treatment Plan:  IOL 04/11/21  Reviewed: Term labor symptoms and general obstetric precautions including but not limited to vaginal bleeding, contractions, leaking of fluid and fetal movement were reviewed in detail with the patient.  All questions were answered. Does have home bp cuff. Office bp cuff given: not applicable. Check bp twice daily, let us know if consistently >150 and/or >95.  Follow-up: No follow-ups on file.   Future Appointments  Date Time Provider [place]  04/07/2021  4:00 PM [facility] [place] [facility] None     Orders Placed This Encounter  Procedures   POC Urinalysis Dipstick OB   Ann Moore  04/05/2021 4:07 PM

## 2021-04-06 ENCOUNTER — Other Ambulatory Visit: Payer: Self-pay | Admitting: Advanced Practice Midwife

## 2021-04-06 ENCOUNTER — Other Ambulatory Visit: Payer: Self-pay | Admitting: Obstetrics & Gynecology

## 2021-04-06 DIAGNOSIS — O133 Gestational [pregnancy-induced] hypertension without significant proteinuria, third trimester: Secondary | ICD-10-CM

## 2021-04-07 ENCOUNTER — Other Ambulatory Visit: Payer: Self-pay

## 2021-04-07 ENCOUNTER — Ambulatory Visit (INDEPENDENT_AMBULATORY_CARE_PROVIDER_SITE_OTHER): Payer: Medicaid Other

## 2021-04-07 DIAGNOSIS — O133 Gestational [pregnancy-induced] hypertension without significant proteinuria, third trimester: Secondary | ICD-10-CM

## 2021-04-07 DIAGNOSIS — Z302 Encounter for sterilization: Secondary | ICD-10-CM

## 2021-04-07 DIAGNOSIS — R8271 Bacteriuria: Secondary | ICD-10-CM

## 2021-04-07 DIAGNOSIS — O99891 Other specified diseases and conditions complicating pregnancy: Secondary | ICD-10-CM

## 2021-04-07 DIAGNOSIS — Z3A36 36 weeks gestation of pregnancy: Secondary | ICD-10-CM

## 2021-04-07 DIAGNOSIS — O0993 Supervision of high risk pregnancy, unspecified, third trimester: Secondary | ICD-10-CM

## 2021-04-07 NOTE — Progress Notes (Signed)
[place] 36+3 wks,cephalic,anterior placenta gr 3,BPP 8/8,FHR 146 bpm,mild bilateral renal pelvic dilatation,RK 7.3 mm,LK 7.8 mm,AFI 15.6 cm,RI .50,.53,.50,.47=20%,EFW 3378 g 89%

## 2021-04-08 ENCOUNTER — Other Ambulatory Visit: Payer: Medicaid Other

## 2021-04-11 ENCOUNTER — Inpatient Hospital Stay (HOSPITAL_COMMUNITY)
Admission: AD | Admit: 2021-04-11 | Discharge: 2021-04-13 | DRG: 798 | Disposition: A | Discharge status: Home / Self Care | Payer: Medicaid Other | Attending: Family Medicine | Admitting: Family Medicine

## 2021-04-11 ENCOUNTER — Encounter (HOSPITAL_COMMUNITY): Payer: Self-pay | Admitting: Obstetrics & Gynecology

## 2021-04-11 ENCOUNTER — Telehealth: Payer: Self-pay | Admitting: Women's Health

## 2021-04-11 ENCOUNTER — Other Ambulatory Visit: Payer: Self-pay

## 2021-04-11 ENCOUNTER — Inpatient Hospital Stay (HOSPITAL_COMMUNITY): Payer: Medicaid Other

## 2021-04-11 DIAGNOSIS — O165 Unspecified maternal hypertension, complicating the puerperium: Secondary | ICD-10-CM

## 2021-04-11 DIAGNOSIS — Z302 Encounter for sterilization: Secondary | ICD-10-CM

## 2021-04-11 DIAGNOSIS — O99892 Other specified diseases and conditions complicating childbirth: Secondary | ICD-10-CM | POA: Diagnosis not present

## 2021-04-11 DIAGNOSIS — Z9851 Tubal ligation status: Secondary | ICD-10-CM

## 2021-04-11 DIAGNOSIS — R8271 Bacteriuria: Secondary | ICD-10-CM

## 2021-04-11 DIAGNOSIS — O139 Gestational [pregnancy-induced] hypertension without significant proteinuria, unspecified trimester: Secondary | ICD-10-CM | POA: Diagnosis present

## 2021-04-11 DIAGNOSIS — F1290 Cannabis use, unspecified, uncomplicated: Secondary | ICD-10-CM | POA: Diagnosis present

## 2021-04-11 DIAGNOSIS — O99891 Other specified diseases and conditions complicating pregnancy: Secondary | ICD-10-CM | POA: Diagnosis present

## 2021-04-11 DIAGNOSIS — Z20822 Contact with and (suspected) exposure to covid-19: Secondary | ICD-10-CM | POA: Diagnosis not present

## 2021-04-11 DIAGNOSIS — Z2839 Other underimmunization status: Secondary | ICD-10-CM

## 2021-04-11 DIAGNOSIS — O0993 Supervision of high risk pregnancy, unspecified, third trimester: Secondary | ICD-10-CM

## 2021-04-11 DIAGNOSIS — O0990 Supervision of high risk pregnancy, unspecified, unspecified trimester: Secondary | ICD-10-CM

## 2021-04-11 DIAGNOSIS — Z3A37 37 weeks gestation of pregnancy: Secondary | ICD-10-CM

## 2021-04-11 DIAGNOSIS — O09899 Supervision of other high risk pregnancies, unspecified trimester: Secondary | ICD-10-CM

## 2021-04-11 DIAGNOSIS — O99324 Drug use complicating childbirth: Secondary | ICD-10-CM | POA: Diagnosis present

## 2021-04-11 DIAGNOSIS — O133 Gestational [pregnancy-induced] hypertension without significant proteinuria, third trimester: Secondary | ICD-10-CM

## 2021-04-11 DIAGNOSIS — O134 Gestational [pregnancy-induced] hypertension without significant proteinuria, complicating childbirth: Principal | ICD-10-CM | POA: Diagnosis present

## 2021-04-11 DIAGNOSIS — F4310 Post-traumatic stress disorder, unspecified: Secondary | ICD-10-CM | POA: Diagnosis present

## 2021-04-11 DIAGNOSIS — R2241 Localized swelling, mass and lump, right lower limb: Secondary | ICD-10-CM | POA: Diagnosis present

## 2021-04-11 DIAGNOSIS — F418 Other specified anxiety disorders: Secondary | ICD-10-CM | POA: Diagnosis present

## 2021-04-11 LAB — COMPREHENSIVE METABOLIC PANEL
ALT: 11 U/L (ref 0–44)
AST: 17 U/L (ref 15–41)
Albumin: 2.7 g/dL — ABNORMAL LOW (ref 3.5–5.0)
Alkaline Phosphatase: 145 U/L — ABNORMAL HIGH (ref 38–126)
Anion gap: 9 (ref 5–15)
BUN: <5 mg/dL — ABNORMAL LOW (ref 6–20)
CO2: 20 mmol/L — ABNORMAL LOW (ref 22–32)
Calcium: 9 mg/dL (ref 8.9–10.3)
Chloride: 106 mmol/L (ref 98–111)
Creatinine, Ser: 0.6 mg/dL (ref 0.44–1.00)
GFR, Estimated: >60 mL/min (ref >60)
Glucose, Bld: 85 mg/dL (ref 70–99)
Potassium: 3.6 mmol/L (ref 3.5–5.1)
Sodium: 135 mmol/L (ref 135–145)
Total Bilirubin: 0.5 mg/dL (ref 0.3–1.2)
Total Protein: 6.3 g/dL — ABNORMAL LOW (ref 6.5–8.1)

## 2021-04-11 LAB — PROTEIN / CREATININE RATIO, URINE
Creatinine, Urine: 38.42 mg/dL
Total Protein, Urine: <6 mg/dL

## 2021-04-11 LAB — CBC
HCT: 33.4 % — ABNORMAL LOW (ref 36.0–46.0)
Hemoglobin: 10.5 g/dL — ABNORMAL LOW (ref 12.0–15.0)
MCH: 26.4 pg (ref 26.0–34.0)
MCHC: 31.4 g/dL (ref 30.0–36.0)
MCV: 84.1 fL (ref 80.0–100.0)
Platelets: 182 10*3/uL (ref 150–400)
RBC: 3.97 MIL/uL (ref 3.87–5.11)
RDW: 13.9 % (ref 11.5–15.5)
WBC: 8.3 10*3/uL (ref 4.0–10.5)
nRBC: 0 % (ref 0.0–0.2)

## 2021-04-11 LAB — RAPID HIV SCREEN (HIV 1/2 AB+AG)
HIV 1/2 Antibodies: NONREACTIVE
HIV-1 P24 Antigen - HIV24: NONREACTIVE

## 2021-04-11 LAB — RESP PANEL BY RT-PCR (FLU A&B, COVID) ARPGX2
Influenza A by PCR: NEGATIVE
Influenza B by PCR: NEGATIVE
SARS Coronavirus 2 by RT PCR: NEGATIVE

## 2021-04-11 LAB — TYPE AND SCREEN
ABO/RH(D): O POS
Antibody Screen: NEGATIVE

## 2021-04-11 MED ORDER — SOD CITRATE-CITRIC ACID 500-334 MG/5ML PO SOLN: 30.0000 mL | ORAL | Status: DC | PRN

## 2021-04-11 MED ORDER — TERBUTALINE SULFATE 1 MG/ML IJ SOLN: 0.2500 mg | Freq: Once | INTRAMUSCULAR | Status: DC | PRN

## 2021-04-11 MED ORDER — FENTANYL CITRATE (PF) 100 MCG/2ML IJ SOLN
50.0000 ug | INTRAMUSCULAR | Status: DC | PRN
Start: 2021-04-11 — End: 2021-04-12

## 2021-04-11 MED ORDER — OXYTOCIN-SODIUM CHLORIDE 30-0.9 UT/500ML-% IV SOLN
1.0000 m[IU]/min | INTRAVENOUS | Status: DC
  Administered 2021-04-12: 2 m[IU]/min via INTRAVENOUS

## 2021-04-11 MED ORDER — LACTATED RINGERS IV SOLN: INTRAVENOUS | Status: DC

## 2021-04-11 MED ORDER — OXYTOCIN-SODIUM CHLORIDE 30-0.9 UT/500ML-% IV SOLN: 1.0000 m[IU]/min | INTRAVENOUS | Status: DC

## 2021-04-11 MED ORDER — OXYCODONE-ACETAMINOPHEN 5-325 MG PO TABS: 1.0000 | ORAL_TABLET | ORAL | Status: DC | PRN

## 2021-04-11 MED ORDER — ACETAMINOPHEN 325 MG PO TABS
650.0000 mg | ORAL_TABLET | ORAL | Status: DC | PRN
  Administered 2021-04-11: 650 mg via ORAL
  Filled 2021-04-11: qty 2

## 2021-04-11 MED ORDER — ONDANSETRON HCL 4 MG/2ML IJ SOLN: 4.0000 mg | Freq: Four times a day (QID) | INTRAMUSCULAR | Status: DC | PRN

## 2021-04-11 MED ORDER — OXYTOCIN BOLUS FROM INFUSION
333.0000 mL | Freq: Once | INTRAVENOUS | Status: AC
  Administered 2021-04-12: 333 mL via INTRAVENOUS

## 2021-04-11 MED ORDER — LACTATED RINGERS IV SOLN
500.0000 mL | INTRAVENOUS | Status: DC | PRN
Start: 2021-04-11 — End: 2021-04-12

## 2021-04-11 MED ORDER — OXYCODONE-ACETAMINOPHEN 5-325 MG PO TABS: 2.0000 | ORAL_TABLET | ORAL | Status: DC | PRN

## 2021-04-11 MED ORDER — LIDOCAINE HCL (PF) 1 % IJ SOLN: 30.0000 mL | INTRAMUSCULAR | Status: DC | PRN

## 2021-04-11 MED ORDER — ZOLPIDEM TARTRATE 5 MG PO TABS
5.0000 mg | ORAL_TABLET | Freq: Every evening | ORAL | Status: DC | PRN
  Administered 2021-04-11: 5 mg via ORAL
  Filled 2021-04-11: qty 1

## 2021-04-11 MED ORDER — OXYTOCIN-SODIUM CHLORIDE 30-0.9 UT/500ML-% IV SOLN
2.5000 [IU]/h | INTRAVENOUS | Status: DC
  Filled 2021-04-11: qty 500

## 2021-04-11 MED ORDER — MISOPROSTOL 25 MCG QUARTER TABLET
25.0000 ug | ORAL_TABLET | ORAL | Status: DC | PRN
  Administered 2021-04-11: 25 ug via VAGINAL
  Filled 2021-04-11: qty 1

## 2021-04-11 NOTE — Telephone Encounter (Signed)
Left message @ 2:46 pm. JSY 

## 2021-04-11 NOTE — H&P (Signed)
OBSTETRIC ADMISSION HISTORY AND PHYSICAL  Ann Moore is a 30 y.o. female [phone] with IUP at 51w0dby early U/S presenting for IOL due to gHTN. She reports +FMs, No LOF, no VB, no blurry vision, headaches or peripheral edema, and RUQ pain.  She plans on formula feeding. She request tubal ligation for birth control. [facility]  Dating: By Early U/S --->  Estimated Date of Delivery: 05/02/21  Sono:    '@[redacted]w[redacted]d' , CWD, normal anatomy, cephalic presentation, anterior placental lie, 3378g, 89% EFW   Prenatal History/Complications: gHTN Depression with anxiety Supervision of high-risk pregnancy HX PTD>weekly Makena   Past Medical History: Past Medical History:  Diagnosis Date   Abnormal Pap smear of cervix 09/08/2019   08/28/19: ASCUS w/ +HRHPV   Per 2019 ASCCP guidelines, needs colposcopy as soon as possible.  Immediate risk of CIN3+ is 4.4%.    Anxiety    Headache    Rubella non-immune status, antepartum 03/10/2014   MMR pp    Trichomonal vaginitis 04/29/2015   04/29/2015   POC: neg 07/2015 Repeat positive 10/08/15, POC neg 10/26/2015 04/04/16 + again.      Past Surgical History: Past Surgical History:  Procedure Laterality Date   DILATION AND CURETTAGE OF UTERUS  2015    Obstetrical History: OB History     Gravida  4   Para  2   Term  1   Preterm  1   AB  1   Living  2      SAB  1   IAB  0   Ectopic  0   Multiple  0   Live Births  2           Social History Social History   Socioeconomic History   Marital status: Single    Spouse name: Not on file   Number of children: 2   Years of education: Not on file   Highest education level: Some college, no degree  Occupational History   Not on file  Tobacco Use   Smoking status: Never   Smokeless tobacco: Never  Vaping Use   Vaping Use: Former  Substance and Sexual Activity   Alcohol use: No    Alcohol/week: 0.0 standard drinks   Drug use: No   Sexual  activity: Yes    Birth control/protection: None  Other Topics Concern   Not on file  Social History Narrative   Lives with her 3 year son-Axon-father is in an out of photo   89year old [place]      Enjoys: outside, cleaning-likes to organize      Diet: eats all food groups-doesn't eat steak or lobster   Caffeine: 2-3 cans of soda, and ice coffee daily    Water: 6-8 cups daily      Wears seat belt   Does not use phone while driving   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   Social Determinants of Health   Financial Resource Strain: Low Risk    Difficulty of Paying Living Expenses: Not hard at all  Food Insecurity: No Food Insecurity   Worried About RCharity fundraiserin the Last Year: Never true   RArboriculturistin the Last Year: Never true  Transportation Needs: No Transportation Needs   Lack of Transportation (Medical): No   Lack of Transportation (Non-Medical): No  Physical Activity: Insufficiently Active   Days of Exercise per Week: 2  days   Minutes of Exercise per Session: 20 min  Stress: No Stress Concern Present   Feeling of Stress : Only a little  Social Connections: Moderately Isolated   Frequency of Communication with Friends and Family: More than three times a week   Frequency of Social Gatherings with Friends and Family: Twice a week   Attends Religious Services: Never   Marine scientist or Organizations: No   Attends Music therapist: Never   Marital Status: Living with partner    Family History: Family History  Problem Relation Age of Onset   Hypertension Mother    Diabetes Sister    Diabetes Maternal Grandfather    Cancer Maternal Grandfather        liver, lung    Allergies: Allergies  Allergen Reactions   Hydrocodone Nausea And Vomiting and Rash    Medications Prior to Admission  Medication Sig Dispense Refill Last Dose   acetaminophen (TYLENOL) 325 MG tablet Take 650 mg by mouth every 6 (six) hours as needed  for mild pain, moderate pain or headache.   Past Month   ferrous sulfate 325 (65 FE) MG tablet Take 1 tablet (325 mg total) by mouth every other day. 45 tablet 2 04/10/2021   Prenatal Vit-Fe Fumarate-FA (PRENATAL VITAMIN PO) Take by mouth.   04/10/2021   Blood Pressure Monitor MISC For regular home bp monitoring during pregnancy 1 each 0    MAKENA autoinjector  (Patient not taking: No sig reported)      mupirocin ointment (BACTROBAN) 2 % Apply 1 application topically 2 (two) times daily. (Patient not taking: No sig reported) 22 g 0    ondansetron (ZOFRAN ODT) 8 MG disintegrating tablet Take 1 tablet (8 mg total) by mouth every 8 (eight) hours as needed for nausea or vomiting. (Patient not taking: No sig reported) 20 tablet 0    promethazine (PHENERGAN) 25 MG tablet Take 0.5-1 tablets (12.5-25 mg total) by mouth every 6 (six) hours as needed for nausea or vomiting. (Patient not taking: No sig reported) 30 tablet 0      Review of Systems   All systems reviewed and negative except as stated in HPI  Blood pressure (!) 142/87, pulse 90, temperature 98 F (36.7 C), temperature source Oral, resp. rate 16, height '5\' 4"'  (1.626 m), weight 94.7 kg, last menstrual period 07/26/2020. General appearance: alert, cooperative, and no distress Lungs: clear to auscultation bilaterally Heart: regular rate and rhythm Abdomen: soft, non-tender; bowel sounds normal Extremities: Homans sign is negative, no sign of DVT Presentation: cephalic Fetal monitoringBaseline: 120 bpm, Variability: Good {> 6 bpm), Accelerations: Reactive, and Decelerations: Absent Uterine activityMild Dilation: 1 Effacement (%): 30 Station: -3 Exam by:: Ann Herald RN   Prenatal labs: ABO, Rh: --/--/PENDING (11/14 1701) Antibody: PENDING (11/14 1701) Rubella: <0.90 (05/26 1217) RPR: Non Reactive (08/31 0841)  HBsAg: Negative (05/26 1217)  HIV: Non Reactive (08/31 0841)  GBS: Negative/-- (11/04 1349)     FAMILY TREE  RESULTS   Language English Pap 11/11/20 NILM, -HRHPV  Initiated care at 12wk GC/CT Initial: -/-           36wks:  Dating by LMP c/w 7wk U/S    Support person  Genetics NT/IT: neg      Panorama: low-risk female  BP cuff  Carrier Screen neg    [place]/Hgb Elec neg  Rhogam n/a    TDaP vaccine 01/26/21  Blood Type O/Positive/-- (05/26 1217)  Flu vaccine Declined 02/22/21  Antibody  Negative (05/26 1217)  Covid vaccine  HBsAg Negative (05/26 1217)    RPR Non Reactive (05/26 1217)  Anatomy US Female 'Rushie Chestnut' mild RPD Rubella  <0.90 (05/26 1217)  Feeding Plan bottle HIV Non Reactive (05/26 1217)  Contraception BTL Hep C neg  Circumcision n/a    Pediatrician [facility] A1C/GTT Early:      26-28wks: normal  Prenatal Classes       GBS     neg  BTL Consent 01/26/21     VBAC Consent n/a PHQ9 & GAD7  '[ ]' New OB  '[ ]' 28wks   '[ ]' 36wks  Waterbirth '[ ]' Class '[ ]'  36wkCNM visit/consent      Prenatal Transfer Tool  Maternal Diabetes: No Genetic Screening: Normal Maternal Ultrasounds/Referrals: Other: Mild RPD Fetal Ultrasounds or other Referrals:  None Maternal Substance Abuse:  No Significant Maternal Medications:  No Significant Maternal Lab Results: Group B Strep negative  Results for orders placed or performed during the hospital encounter of 04/11/21 (from the past 24 hour(s))  Type and screen   Collection Time: 04/11/21  5:01 PM  Result Value Ref Range   ABO/RH(D) PENDING    Antibody Screen PENDING    Sample Expiration      04/14/2021,2359 Performed at [facility], [place] [address] [place], [place] [address]     Patient Active Problem List   Diagnosis Date Noted   Gestational hypertension 03/29/2021   Request for sterilization 12/22/2020   Asymptomatic bacteriuria during pregnancy 10/27/2020   Supervision of high-risk pregnancy 10/21/2020   History of preterm delivery 10/21/2020   Chlamydia 04/05/2020   Adult ADHD 11/06/2019   Generalized headaches 09/25/2019   Nicotine abuse  09/25/2019   Abnormal Pap smear of cervix 09/08/2019   Marijuana use 09/20/2015   Depression with anxiety 12/09/2014   Rubella non-immune status, antepartum 03/10/2014    Assessment/Plan:  BRANDIS WIXTED is a 30 y.o. [id number] at 6w0dhere for IOL 2/2 GHTN  #Labor:cytotec>foley>pitocin #Pain: Epidural #FWB: Cat 1 #ID:  GBS neg #MOF: bottle #MOC:BTL #Circ:  [place] Student-PA  04/11/2021, 5:41 PM

## 2021-04-11 NOTE — Progress Notes (Signed)
Patient Vitals for the past 4 hrs:  BP Pulse Resp  04/11/21 2315 -- -- 16  04/11/21 2205 116/73 92 18  04/11/21 1922 -- -- 18   No HA, RUQ pain, vision changes.  Ctx q 1-2 minutes, pt feeling them. FHR Cat 1.  Cx 1.5/50/-3.  Cooks catheter inserted and inflated w/80 cc H20.  Will start pitocin (if needed) when balloon falls out.

## 2021-04-11 NOTE — Telephone Encounter (Signed)
[facility] called to report elevated BP  BP 132/99 with nausea & abd pain reported 2:30pm today   Please advise

## 2021-04-12 ENCOUNTER — Inpatient Hospital Stay (HOSPITAL_COMMUNITY): Payer: Medicaid Other | Admitting: Certified Registered Nurse Anesthetist

## 2021-04-12 ENCOUNTER — Inpatient Hospital Stay (HOSPITAL_COMMUNITY): Payer: Medicaid Other | Admitting: Anesthesiology

## 2021-04-12 ENCOUNTER — Encounter (HOSPITAL_COMMUNITY): Payer: Self-pay | Admitting: Obstetrics & Gynecology

## 2021-04-12 ENCOUNTER — Encounter (HOSPITAL_COMMUNITY)
Admission: AD | Disposition: A | Discharge status: Home / Self Care | Payer: Self-pay | Source: Home / Self Care | Attending: Family Medicine

## 2021-04-12 DIAGNOSIS — Z9851 Tubal ligation status: Secondary | ICD-10-CM

## 2021-04-12 DIAGNOSIS — Z302 Encounter for sterilization: Secondary | ICD-10-CM | POA: Diagnosis not present

## 2021-04-12 DIAGNOSIS — O134 Gestational [pregnancy-induced] hypertension without significant proteinuria, complicating childbirth: Secondary | ICD-10-CM | POA: Diagnosis not present

## 2021-04-12 DIAGNOSIS — Z3A37 37 weeks gestation of pregnancy: Secondary | ICD-10-CM | POA: Diagnosis not present

## 2021-04-12 DIAGNOSIS — O164 Unspecified maternal hypertension, complicating childbirth: Secondary | ICD-10-CM | POA: Diagnosis not present

## 2021-04-12 HISTORY — PX: TUBAL LIGATION: SHX77

## 2021-04-12 LAB — CBC
HCT: 33.5 % — ABNORMAL LOW (ref 36.0–46.0)
Hemoglobin: 10.7 g/dL — ABNORMAL LOW (ref 12.0–15.0)
MCH: 26.7 pg (ref 26.0–34.0)
MCHC: 31.9 g/dL (ref 30.0–36.0)
MCV: 83.5 fL (ref 80.0–100.0)
Platelets: 149 10*3/uL — ABNORMAL LOW (ref 150–400)
RBC: 4.01 MIL/uL (ref 3.87–5.11)
RDW: 13.7 % (ref 11.5–15.5)
WBC: 13.7 10*3/uL — ABNORMAL HIGH (ref 4.0–10.5)
nRBC: 0 % (ref 0.0–0.2)

## 2021-04-12 LAB — RPR: RPR Ser Ql: NONREACTIVE

## 2021-04-12 SURGERY — LIGATION, FALLOPIAN TUBE, POSTPARTUM
Anesthesia: Epidural

## 2021-04-12 MED ORDER — FENTANYL CITRATE (PF) 100 MCG/2ML IJ SOLN
INTRAMUSCULAR | Status: DC | PRN
  Administered 2021-04-12: 100 ug via EPIDURAL

## 2021-04-12 MED ORDER — FENTANYL CITRATE (PF) 100 MCG/2ML IJ SOLN
INTRAMUSCULAR | Status: AC
  Filled 2021-04-12: qty 2

## 2021-04-12 MED ORDER — ONDANSETRON HCL 4 MG/2ML IJ SOLN
INTRAMUSCULAR | Status: AC
  Filled 2021-04-12: qty 2

## 2021-04-12 MED ORDER — PRENATAL MULTIVITAMIN CH
1.0000 | ORAL_TABLET | Freq: Every day | ORAL | Status: DC
  Administered 2021-04-13: 1 via ORAL
  Filled 2021-04-12: qty 1

## 2021-04-12 MED ORDER — ONDANSETRON HCL 4 MG PO TABS: 4.0000 mg | ORAL_TABLET | ORAL | Status: DC | PRN

## 2021-04-12 MED ORDER — FENTANYL CITRATE (PF) 100 MCG/2ML IJ SOLN
INTRAMUSCULAR | Status: DC | PRN
  Administered 2021-04-12: 100 ug via INTRAVENOUS

## 2021-04-12 MED ORDER — LACTATED RINGERS IV SOLN: 500.0000 mL | Freq: Once | INTRAVENOUS | Status: DC

## 2021-04-12 MED ORDER — LIDOCAINE HCL (PF) 1 % IJ SOLN
INTRAMUSCULAR | Status: DC | PRN
  Administered 2021-04-12: 6 mL via EPIDURAL

## 2021-04-12 MED ORDER — MEDROXYPROGESTERONE ACETATE 150 MG/ML IM SUSP: 150.0000 mg | INTRAMUSCULAR | Status: DC | PRN

## 2021-04-12 MED ORDER — OXYCODONE HCL 5 MG/5ML PO SOLN: 5.0000 mg | Freq: Once | ORAL | Status: DC | PRN

## 2021-04-12 MED ORDER — DIPHENHYDRAMINE HCL 50 MG/ML IJ SOLN: 12.5000 mg | INTRAMUSCULAR | Status: DC | PRN

## 2021-04-12 MED ORDER — PHENYLEPHRINE 40 MCG/ML (10ML) SYRINGE FOR IV PUSH (FOR BLOOD PRESSURE SUPPORT): 80.0000 ug | PREFILLED_SYRINGE | INTRAVENOUS | Status: DC | PRN

## 2021-04-12 MED ORDER — MIDAZOLAM HCL 2 MG/2ML IJ SOLN
INTRAMUSCULAR | Status: DC | PRN
  Administered 2021-04-12: 2 mg via INTRAVENOUS

## 2021-04-12 MED ORDER — BENZOCAINE-MENTHOL 20-0.5 % EX AERO: 1.0000 "application " | INHALATION_SPRAY | CUTANEOUS | Status: DC | PRN

## 2021-04-12 MED ORDER — FENTANYL-BUPIVACAINE-NACL 0.5-0.125-0.9 MG/250ML-% EP SOLN
12.0000 mL/h | EPIDURAL | Status: DC | PRN
  Administered 2021-04-12: 12 mL/h via EPIDURAL
  Filled 2021-04-12: qty 250

## 2021-04-12 MED ORDER — TETANUS-DIPHTH-ACELL PERTUSSIS 5-2.5-18.5 LF-MCG/0.5 IM SUSY: 0.5000 mL | PREFILLED_SYRINGE | Freq: Once | INTRAMUSCULAR | Status: DC

## 2021-04-12 MED ORDER — SIMETHICONE 80 MG PO CHEW: 80.0000 mg | CHEWABLE_TABLET | ORAL | Status: DC | PRN

## 2021-04-12 MED ORDER — ACETAMINOPHEN 160 MG/5ML PO SOLN: 325.0000 mg | ORAL | Status: DC | PRN

## 2021-04-12 MED ORDER — PROPOFOL 10 MG/ML IV BOLUS
INTRAVENOUS | Status: DC | PRN
  Administered 2021-04-12: 200 mg via INTRAVENOUS

## 2021-04-12 MED ORDER — COCONUT OIL OIL: 1.0000 "application " | TOPICAL_OIL | Status: DC | PRN

## 2021-04-12 MED ORDER — ONDANSETRON HCL 4 MG/2ML IJ SOLN
INTRAMUSCULAR | Status: DC | PRN
  Administered 2021-04-12: 4 mg via INTRAVENOUS

## 2021-04-12 MED ORDER — MEASLES, MUMPS & RUBELLA VAC IJ SOLR: 0.5000 mL | Freq: Once | INTRAMUSCULAR | Status: DC

## 2021-04-12 MED ORDER — MEPERIDINE HCL 25 MG/ML IJ SOLN: 6.2500 mg | INTRAMUSCULAR | Status: DC | PRN

## 2021-04-12 MED ORDER — DEXAMETHASONE SODIUM PHOSPHATE 10 MG/ML IJ SOLN
INTRAMUSCULAR | Status: AC
  Filled 2021-04-12: qty 1

## 2021-04-12 MED ORDER — ACETAMINOPHEN 325 MG PO TABS: 325.0000 mg | ORAL_TABLET | ORAL | Status: DC | PRN

## 2021-04-12 MED ORDER — WITCH HAZEL-GLYCERIN EX PADS: 1.0000 "application " | MEDICATED_PAD | CUTANEOUS | Status: DC | PRN

## 2021-04-12 MED ORDER — SUCCINYLCHOLINE CHLORIDE 200 MG/10ML IV SOSY
PREFILLED_SYRINGE | INTRAVENOUS | Status: DC | PRN
  Administered 2021-04-12: 140 mg via INTRAVENOUS

## 2021-04-12 MED ORDER — PROPOFOL 10 MG/ML IV BOLUS
INTRAVENOUS | Status: AC
  Filled 2021-04-12: qty 20

## 2021-04-12 MED ORDER — SUCCINYLCHOLINE CHLORIDE 200 MG/10ML IV SOSY
PREFILLED_SYRINGE | INTRAVENOUS | Status: AC
  Filled 2021-04-12: qty 10

## 2021-04-12 MED ORDER — ACETAMINOPHEN 325 MG PO TABS
650.0000 mg | ORAL_TABLET | ORAL | Status: DC | PRN
  Administered 2021-04-12 (×2): 650 mg via ORAL
  Filled 2021-04-12 (×2): qty 2

## 2021-04-12 MED ORDER — FENTANYL CITRATE (PF) 100 MCG/2ML IJ SOLN: 25.0000 ug | INTRAMUSCULAR | Status: DC | PRN

## 2021-04-12 MED ORDER — EPHEDRINE 5 MG/ML INJ: 10.0000 mg | INTRAVENOUS | Status: DC | PRN

## 2021-04-12 MED ORDER — OXYCODONE HCL 5 MG PO TABS: 5.0000 mg | ORAL_TABLET | Freq: Once | ORAL | Status: DC | PRN

## 2021-04-12 MED ORDER — DEXAMETHASONE SODIUM PHOSPHATE 10 MG/ML IJ SOLN
INTRAMUSCULAR | Status: DC | PRN
  Administered 2021-04-12: 10 mg via INTRAVENOUS

## 2021-04-12 MED ORDER — DIPHENHYDRAMINE HCL 25 MG PO CAPS: 25.0000 mg | ORAL_CAPSULE | Freq: Four times a day (QID) | ORAL | Status: DC | PRN

## 2021-04-12 MED ORDER — ONDANSETRON HCL 4 MG/2ML IJ SOLN: 4.0000 mg | Freq: Once | INTRAMUSCULAR | Status: DC | PRN

## 2021-04-12 MED ORDER — SENNOSIDES-DOCUSATE SODIUM 8.6-50 MG PO TABS
2.0000 | ORAL_TABLET | Freq: Every day | ORAL | Status: DC
  Administered 2021-04-13: 2 via ORAL
  Filled 2021-04-12: qty 2

## 2021-04-12 MED ORDER — BUPIVACAINE HCL (PF) 0.25 % IJ SOLN
INTRAMUSCULAR | Status: AC
  Filled 2021-04-12: qty 10

## 2021-04-12 MED ORDER — MIDAZOLAM HCL 2 MG/2ML IJ SOLN
INTRAMUSCULAR | Status: AC
  Filled 2021-04-12: qty 2

## 2021-04-12 MED ORDER — DIBUCAINE (PERIANAL) 1 % EX OINT: 1.0000 "application " | TOPICAL_OINTMENT | CUTANEOUS | Status: DC | PRN

## 2021-04-12 MED ORDER — ONDANSETRON HCL 4 MG/2ML IJ SOLN: 4.0000 mg | INTRAMUSCULAR | Status: DC | PRN

## 2021-04-12 MED ORDER — LIDOCAINE-EPINEPHRINE (PF) 2 %-1:200000 IJ SOLN
INTRAMUSCULAR | Status: DC | PRN
  Administered 2021-04-12 (×4): 5 mL via INTRADERMAL

## 2021-04-12 MED ORDER — SODIUM CHLORIDE 0.9 % IR SOLN
Status: DC | PRN
  Administered 2021-04-12: 20 mL

## 2021-04-12 MED ORDER — IBUPROFEN 600 MG PO TABS
600.0000 mg | ORAL_TABLET | Freq: Four times a day (QID) | ORAL | Status: DC
  Administered 2021-04-12 – 2021-04-13 (×4): 600 mg via ORAL
  Filled 2021-04-12 (×4): qty 1

## 2021-04-12 SURGICAL SUPPLY — 21 items
BLADE SURG 11 STRL SS (BLADE) ×3 IMPLANT
CLIP FILSHIE TUBAL LIGA STRL (Clip) ×1 IMPLANT
DRSG OPSITE POSTOP 3X4 (GAUZE/BANDAGES/DRESSINGS) ×3 IMPLANT
DURAPREP 26ML APPLICATOR (WOUND CARE) ×3 IMPLANT
GLOVE BIOGEL PI IND STRL 7.0 (GLOVE) ×1 IMPLANT
GLOVE BIOGEL PI IND STRL 7.5 (GLOVE) ×1 IMPLANT
GLOVE BIOGEL PI INDICATOR 7.0 (GLOVE) ×2
GLOVE BIOGEL PI INDICATOR 7.5 (GLOVE) ×2
GLOVE ECLIPSE 7.5 STRL STRAW (GLOVE) ×3 IMPLANT
GOWN STRL REUS W/TWL LRG LVL3 (GOWN DISPOSABLE) ×6 IMPLANT
HIBICLENS CHG 4% 4OZ BTL (MISCELLANEOUS) ×3 IMPLANT
NEEDLE HYPO 22GX1.5 SAFETY (NEEDLE) ×3 IMPLANT
NS IRRIG 1000ML POUR BTL (IV SOLUTION) ×3 IMPLANT
PACK ABDOMINAL MINOR (CUSTOM PROCEDURE TRAY) ×3 IMPLANT
PROTECTOR NERVE ULNAR (MISCELLANEOUS) ×3 IMPLANT
SPONGE LAP 4X18 RFD (DISPOSABLE) IMPLANT
SUT VICRYL 0 UR6 27IN ABS (SUTURE) ×3 IMPLANT
SUT VICRYL 4-0 PS2 18IN ABS (SUTURE) ×3 IMPLANT
SYR CONTROL 10ML LL (SYRINGE) ×3 IMPLANT
TOWEL OR 17X24 6PK STRL BLUE (TOWEL DISPOSABLE) ×6 IMPLANT
TRAY FOLEY W/BAG SLVR 14FR (SET/KITS/TRAYS/PACK) ×3 IMPLANT

## 2021-04-12 NOTE — Progress Notes (Signed)
Today's Vitals   04/11/21 1830 04/11/21 1922 04/11/21 2205 04/11/21 2315  BP: (!) 145/84  116/73 (!) 138/91  Pulse: 98  92 88  Resp:  18 18 16   Temp:      TempSrc:      Weight:      Height:      PainSc: 4       Body mass index is 35.82 kg/m. Ctx are not as strong anymore.  FHR Cat 1.  Ctx q 2-3 minutes.  Cx 5-6/70/-2.  Will get epidural and start pitocin.

## 2021-04-12 NOTE — Anesthesia Preprocedure Evaluation (Signed)
Anesthesia Evaluation  Patient identified by MRN, date of birth, ID band Patient awake    Reviewed: Allergy & Precautions, Patient's Chart, lab work & pertinent test results  History of Anesthesia Complications Negative for: history of anesthetic complications  Airway Mallampati: II  TM Distance: >3 FB Neck ROM: Full    Dental no notable dental hx. (+) Dental Advisory Given   Pulmonary    Pulmonary exam normal breath sounds clear to auscultation       Cardiovascular hypertension, Normal cardiovascular exam Rhythm:Regular Rate:Normal     Neuro/Psych  Headaches, PSYCHIATRIC DISORDERS Anxiety Depression    GI/Hepatic negative GI ROS, (+)     substance abuse  marijuana use,   Endo/Other  negative endocrine ROS  Renal/GU negative Renal ROS  negative genitourinary   Musculoskeletal negative musculoskeletal ROS (+)   Abdominal   Peds negative pediatric ROS (+)  Hematology negative hematology ROS (+)   Anesthesia Other Findings   Reproductive/Obstetrics                             Anesthesia Physical  Anesthesia Plan  ASA: 2  Anesthesia Plan: Epidural   Post-op Pain Management:    Induction:   PONV Risk Score and Plan: 2 and Treatment may vary due to age or medical condition  Airway Management Planned: Natural Airway  Additional Equipment: None  Intra-op Plan:   Post-operative Plan:   Informed Consent: I have reviewed the patients History and Physical, chart, labs and discussed the procedure including the risks, benefits and alternatives for the proposed anesthesia with the patient or authorized representative who has indicated his/her understanding and acceptance.       Plan Discussed with: Anesthesiologist and CRNA  Anesthesia Plan Comments:         Anesthesia Quick Evaluation

## 2021-04-12 NOTE — Op Note (Signed)
Postpartum Tubal Ligation Operative Note   Patient: Ann Moore  Date of Procedure: 04/12/2021  Procedure: Postpartum bilateral Tubal Ligation via Bilateral salpingectomy using Ligasure  Indications: undesired fertility  Pre-operative Diagnosis: post vaginal tubal ligation.   Post-operative Diagnosis: Bilateral Tubal Sterilization via Bilateral salpingectomy (using Ligasure)  Surgeon: Surgeon(s) and Role:    * Levie Heritage, DO - Primary    * Warner Mccreedy, MD - Assisting   An experienced assistant was required given the standard of surgical care given the complexity of the case.  This assistant was needed for exposure, dissection, suctioning, retraction, instrument exchange, assisting with delivery with administration of fundal pressure, and for overall help during the procedure.   Anesthesia: epidural from L&D utilized, however due to insufficient anesthesia patient was ultimately placed under general anesthesia  Antibiotics: None   Estimated Blood Loss: 25 ml   Total IV Fluids: 600 ml  Urine Output:  650 cc OF clear urine  Specimens: right and left fallopian tube   Complications: no complications   Indications: Ann Moore is a 30 y.o. [phone] with undesired fertility, status post vaginal delivery, desires permanent sterilization.  Other reversible forms of contraception were discussed with patient; she declines all other modalities. Risks of procedure discussed with patient including but not limited to: risk of regret, permanence of method, bleeding, infection, injury to surrounding organs and need for additional procedures.  Failure risk of 1-2 % with increased risk of ectopic gestation if pregnancy occurs and possibility of post-tubal pain syndrome also discussed with patient.   Findings: Normal uterus, tubes, and ovaries.  Procedure Details: The patient was taken to the operating room where epidural anesthesia was dosed and despite multiple redoing was found  to be insufficient and patient was ultimately placed under general anesthesia for the procedure.  She was in the dorsal supine position and prepped and draped in sterile fashion.  After an adequate timeout was performed, attention was turned to the patient's abdomen where a small transverse skin incision was made under the umbilical fold. The incision was taken down to the layer of fascia using the scalpel, and fascia was incised, and the incision was extended bilaterally. The peritoneum was entered in a blunt fashion.   Attention was then turned to the fallopian tubes. Bilateral salpingectomy: The ligasure applicator device was placed across the left fallopian tube taking care to incorporate the fimbriae. The tube was then ligated using the ligasure device and there was excellent hemostasis noted. The same procedure was then carried out on the right fallopian tube with excellent hemostasis noted.  Good hemostasis was noted overall. All laps and instruments were then removed from the patient's abdomen and the fascial incision was repaired with 0 Vicryl, the subcutaneous tissue was closed with 4-0 vicryl, and the skin was closed with a 4-0 Vicryl subcuticular stitch. The patient tolerated the procedure well.  Instrument, sponge, and needle counts were correct times three.  The patient was then taken to the recovery room awake and in stable condition.  Disposition:  To PACU and then mother baby   Signed: Warner Mccreedy, MD, MPH [facility])

## 2021-04-12 NOTE — Progress Notes (Signed)
Patient Vitals for the past 4 hrs:  BP Temp Temp src Pulse Resp SpO2  04/12/21 0701 125/80 -- -- 80 15 --  04/12/21 0631 132/82 -- -- 85 16 --  04/12/21 0601 136/89 -- -- 85 16 --  04/12/21 0531 (!) 145/85 98.4 F (36.9 C) Oral 84 16 --  04/12/21 0501 135/89 -- -- 87 16 --  04/12/21 0456 (!) 140/96 -- -- 91 -- --  04/12/21 0451 135/90 -- -- 89 -- --  04/12/21 0446 139/85 -- -- 82 -- --  04/12/21 0441 (!) 141/80 -- -- 94 -- --  04/12/21 0436 (!) 146/84 -- -- 81 -- --  04/12/21 0431 (!) 150/88 -- -- 82 16 --  04/12/21 0430 -- -- -- -- -- 99 %  04/12/21 0425 -- -- -- -- -- 98 %  04/12/21 0424 (!) 143/93 -- -- 100 -- --  04/12/21 0420 -- -- -- -- -- 99 %  04/12/21 0416 (!) 144/108 -- -- 92 -- --  04/12/21 0415 -- -- -- -- -- 99 %   Comfortable w/epidural.  Ctx q 2-3 minutes.  Cx 7/90/-1  FHR Cat 1.  Pitocin at6 mu/min.  AROM w/clear fluid. Expect delivery soon.

## 2021-04-12 NOTE — Progress Notes (Signed)
   04/12/21 2130 04/12/21 2225  Vital Signs  BP 129/90 125/86  BP Location Left Arm Right Arm  Patient Position (if appropriate) Sitting Sitting  BP Method Automatic Automatic  Pulse Rate 98 90   RN notified on-call MD to notify of pt's recent BP's being slightly elevated. No new orders given. RN will continue to monitor throughout shift.    Herbert Moors, RN

## 2021-04-12 NOTE — Anesthesia Procedure Notes (Signed)
Epidural Patient location during procedure: OB Start time: 04/12/2021 4:17 AM End time: 04/12/2021 4:30 AM  Staffing Anesthesiologist: Mellody Dance, MD Performed: anesthesiologist   Preanesthetic Checklist Completed: patient identified, IV checked, site marked, risks and benefits discussed, monitors and equipment checked, pre-op evaluation and timeout performed  Epidural Patient position: sitting Prep: DuraPrep Patient monitoring: heart rate, cardiac monitor, continuous pulse ox and blood pressure Approach: midline Location: L3-L4 Injection technique: LOR saline  Needle:  Needle type: Tuohy  Needle gauge: 17 G Needle length: 9 cm Needle insertion depth: 7 cm Catheter type: closed end flexible Catheter size: 20 Guage Catheter at skin depth: 11 cm Test dose: negative and Other  Assessment Events: blood not aspirated, injection not painful, no injection resistance and negative IV test  Additional Notes Informed consent obtained prior to proceeding including risk of failure, 1% risk of PDPH, risk of minor discomfort and bruising.  Discussed rare but serious complications including epidural abscess, permanent nerve injury, epidural hematoma.  Discussed alternatives to epidural analgesia and patient desires to proceed.  Timeout performed pre-procedure verifying patient name, procedure, and platelet count.  Patient tolerated procedure well.

## 2021-04-12 NOTE — Transfer of Care (Signed)
Immediate Anesthesia Transfer of Care Note  Patient: KEARI MIU  Procedure(s) Performed: POST PARTUM TUBAL LIGATION  Patient Location: PACU  Anesthesia Type:General  Level of Consciousness: awake, alert  and oriented  Airway & Oxygen Therapy: Patient Spontanous Breathing and Patient connected to nasal cannula oxygen  Post-op Assessment: Report given to RN and Post -op Vital signs reviewed and stable  Post vital signs: Reviewed and stable  Last Vitals:  Vitals Value Taken Time  BP    Temp    Pulse 119 04/12/21 1303  Resp 11 04/12/21 1303  SpO2 98 % 04/12/21 1303  Vitals shown include unvalidated device data.  Last Pain:  Vitals:   04/12/21 1046  TempSrc:   PainSc: 0-No pain      Patients Stated Pain Goal: 0 (04/11/21 1830)  Complications: No notable events documented.

## 2021-04-12 NOTE — Anesthesia Postprocedure Evaluation (Signed)
Anesthesia Post Note  Patient: Ann Moore  Procedure(s) Performed: POST PARTUM TUBAL LIGATION     Patient location during evaluation: Mother Baby Anesthesia Type: General Level of consciousness: awake and alert Pain management: pain level controlled Vital Signs Assessment: post-procedure vital signs reviewed and stable Respiratory status: spontaneous breathing, nonlabored ventilation and respiratory function stable Cardiovascular status: stable Postop Assessment: no headache, no backache and epidural receding Anesthetic complications: no   No notable events documented.  Last Vitals:  Vitals:   04/12/21 1524 04/12/21 1538  BP: (!) 138/101 133/88  Pulse: 96 74  Resp: 20   Temp: 36.9 C   SpO2:      Last Pain:  Vitals:   04/12/21 1640  TempSrc:   PainSc: 0-No pain   Pain Goal: Patients Stated Pain Goal: 0 (04/11/21 1830)                 [name]

## 2021-04-12 NOTE — Anesthesia Preprocedure Evaluation (Signed)
Anesthesia Evaluation  Patient identified by MRN, date of birth, ID band Patient awake    Reviewed: Allergy & Precautions, Patient's Chart, lab work & pertinent test results  History of Anesthesia Complications Negative for: history of anesthetic complications  Airway Mallampati: II  TM Distance: >3 FB Neck ROM: Full    Dental no notable dental hx. (+) Dental Advisory Given   Pulmonary    Pulmonary exam normal breath sounds clear to auscultation       Cardiovascular hypertension, Normal cardiovascular exam Rhythm:Regular Rate:Normal     Neuro/Psych  Headaches, PSYCHIATRIC DISORDERS Anxiety Depression    GI/Hepatic negative GI ROS, (+)     substance abuse  marijuana use,   Endo/Other  negative endocrine ROS  Renal/GU negative Renal ROS  negative genitourinary   Musculoskeletal negative musculoskeletal ROS (+)   Abdominal   Peds negative pediatric ROS (+)  Hematology negative hematology ROS (+)   Anesthesia Other Findings   Reproductive/Obstetrics (+) Pregnancy                             Anesthesia Physical  Anesthesia Plan  ASA: 2  Anesthesia Plan: Epidural   Post-op Pain Management:    Induction:   PONV Risk Score and Plan: 2 and Treatment may vary due to age or medical condition  Airway Management Planned:   Additional Equipment:   Intra-op Plan:   Post-operative Plan:   Informed Consent: I have reviewed the patients History and Physical, chart, labs and discussed the procedure including the risks, benefits and alternatives for the proposed anesthesia with the patient or authorized representative who has indicated his/her understanding and acceptance.       Plan Discussed with: Anesthesiologist  Anesthesia Plan Comments:         Anesthesia Quick Evaluation

## 2021-04-12 NOTE — Anesthesia Postprocedure Evaluation (Signed)
Anesthesia Post Note  Patient: Ann Moore  Procedure(s) Performed: AN AD HOC LABOR EPIDURAL     Patient location during evaluation: Mother Baby Anesthesia Type: Epidural Level of consciousness: awake and alert Pain management: pain level controlled Vital Signs Assessment: post-procedure vital signs reviewed and stable Respiratory status: spontaneous breathing, nonlabored ventilation and respiratory function stable Cardiovascular status: stable Postop Assessment: no headache, no backache and epidural receding Anesthetic complications: no   No notable events documented.  Last Vitals:  Vitals:   04/12/21 1524 04/12/21 1538  BP: (!) 138/101 133/88  Pulse: 96 74  Resp: 20   Temp: 36.9 C   SpO2:      Last Pain:  Vitals:   04/12/21 1640  TempSrc:   PainSc: 0-No pain   Pain Goal: Patients Stated Pain Goal: 0 (04/11/21 1830)                 Zyrus Hetland

## 2021-04-12 NOTE — Anesthesia Procedure Notes (Signed)
Procedure Name: Intubation Date/Time: 04/12/2021 12:16 PM Performed by: Lyda Jester, CRNA Pre-anesthesia Checklist: Patient identified, Patient being monitored, Timeout performed, Emergency Drugs available and Suction available Patient Re-evaluated:Patient Re-evaluated prior to induction Oxygen Delivery Method: Circle System Utilized Preoxygenation: Pre-oxygenation with 100% oxygen Induction Type: IV induction Ventilation: Mask ventilation without difficulty Laryngoscope Size: Mac and 3 Grade View: Grade II Tube type: Oral Tube size: 7.0 mm Number of attempts: 1 Airway Equipment and Method: stylet Placement Confirmation: ETT inserted through vocal cords under direct vision, positive ETCO2 and breath sounds checked- equal and bilateral Secured at: 21 cm Tube secured with: Tape Dental Injury: Teeth and Oropharynx as per pre-operative assessment

## 2021-04-12 NOTE — Progress Notes (Signed)
Patient Vitals for the past 4 hrs:  BP Pulse Resp  04/11/21 2315 (!) 138/91 88 16  04/11/21 2205 116/73 92 18   Foley out, declined cx exam. FHR Cat 1.  Ctx moderate, q 2-4 minutes.  Will continue present mgt, add Pitocin if ctx space out.

## 2021-04-12 NOTE — Progress Notes (Signed)
Visible reddened area noted above honeycomb dressing, above umbilicus. Noted to likely be from the POD monitoring system. Patient states no complaints from that area. Also noted a tegaderm applied to inner right thigh secondary to a boil being removed per OR RN.

## 2021-04-12 NOTE — Discharge Summary (Addendum)
Postpartum Discharge Summary  Date of Service updated 04/13/21     Patient Name: Ann Moore DOB: 14-Dec-1990 MRN: [id number]  Date of admission: 04/11/2021 Delivery date:04/12/2021  Delivering provider: Renard Matter  Date of discharge: 04/13/2021  Admitting diagnosis: Gestational hypertension [O13.9] Intrauterine pregnancy: [redacted]w[redacted]d    Secondary diagnosis:  Active Problems:   Rubella non-immune status, antepartum   Depression with anxiety   Supervision of high-risk pregnancy   Asymptomatic bacteriuria during pregnancy   Request for sterilization   Gestational hypertension   Vaginal delivery   S/P tubal ligation  Additional problems: None    Discharge diagnosis: Term Pregnancy Delivered and Gestational Hypertension                                              Post partum procedures:postpartum tubal ligation(using ligasure) Augmentation: Pitocin, Cytotec, and IP Foley Complications: None  Hospital course: Induction of Labor With Vaginal Delivery   30y.o. yo [id number] admitted to the hospital 04/11/2021 for induction of labor.  Indication for induction: Gestational hypertension.  Patient had an uncomplicated labor course as follows: Membrane Rupture Time/Date: 7:17 AM ,04/12/2021   Delivery Method:Vaginal, Spontaneous  Episiotomy: None  Lacerations:  None  Details of delivery can be found in separate delivery note.  Patient had a routine postpartum course. Patient is discharged home 04/13/21.  Of note at time of delivery procedure performed to remove pedunculated mass on leg. See delivery note for details.  Newborn Data: Birth date:04/12/2021  Birth time:8:45 AM  Gender:Female  Living status:Living  Apgars:8 ,9  Weight:6 lb 9.8 oz (3 kg)   Magnesium Sulfate received: No BMZ received: No Rhophylac:N/A MMR:Yes Ordered post partum T-DaP:Given prenatally Flu: No Transfusion:No  Physical exam  Vitals:   04/12/21 2130 04/12/21 2225 04/13/21 0125  04/13/21 0610  BP: 129/90 125/86 127/79 125/83  Pulse: 98 90 92 89  Resp: '20  20 18  ' Temp: 97.9 F (36.6 C)  98.1 F (36.7 C) 98 F (36.7 C)  TempSrc: Oral  Oral Oral  SpO2: 98%  97% 98%  Weight:      Height:       General: alert, cooperative, and no distress Lochia: appropriate Uterine Fundus: firm Incision: Healing well with no significant drainage, No significant erythema, Dressing is clean, dry, and intact DVT Evaluation: No evidence of DVT seen on physical exam. Negative Homan's sign. No cords or calf tenderness. No significant calf/ankle edema. Labs: Lab Results  Component Value Date   WBC 13.7 (H) 04/12/2021   HGB 10.7 (L) 04/12/2021   HCT 33.5 (L) 04/12/2021   MCV 83.5 04/12/2021   PLT 149 (L) 04/12/2021   CMP Latest Ref Rng & Units 04/11/2021  Glucose 70 - 99 mg/dL 85  BUN 6 - 20 mg/dL <5(L)  Creatinine 0.44 - 1.00 mg/dL 0.60  Sodium 135 - 145 mmol/L 135  Potassium 3.5 - 5.1 mmol/L 3.6  Chloride 98 - 111 mmol/L 106  CO2 22 - 32 mmol/L 20(L)  Calcium 8.9 - 10.3 mg/dL 9.0  Total Protein 6.5 - 8.1 g/dL 6.3(L)  Total Bilirubin 0.3 - 1.2 mg/dL 0.5  Alkaline Phos 38 - 126 U/L 145(H)  AST 15 - 41 U/L 17  ALT 0 - 44 U/L 11   Edinburgh Score: No flowsheet data found.   After visit meds:  Allergies as of  04/13/2021       Reactions   Hydrocodone Nausea And Vomiting, Rash        Medication List     STOP taking these medications    Makena autoinjector Generic drug: HYDROXYprogesterone caproate   mupirocin ointment 2 % Commonly known as: BACTROBAN   ondansetron 8 MG disintegrating tablet Commonly known as: Zofran ODT   promethazine 25 MG tablet Commonly known as: PHENERGAN       TAKE these medications    acetaminophen 325 MG tablet Commonly known as: Tylenol Take 2 tablets (650 mg total) by mouth every 4 (four) hours as needed (for pain scale < 4). What changed:  when to take this reasons to take this   Blood Pressure Monitor Misc For  regular home bp monitoring during pregnancy   ferrous sulfate 325 (65 FE) MG tablet Take 1 tablet (325 mg total) by mouth every other day.   ibuprofen 600 MG tablet Commonly known as: ADVIL Take 1 tablet (600 mg total) by mouth every 6 (six) hours.   NIFEdipine 30 MG 24 hr tablet Commonly known as: ADALAT CC Take 1 tablet (30 mg total) by mouth daily.   PRENATAL VITAMIN PO Take by mouth.   Senexon-S 8.6-50 MG tablet Generic drug: senna-docusate Take 2 tablets by mouth at bedtime as needed for mild constipation.   V-R GAS RELIEF 80 MG chewable tablet Generic drug: simethicone Chew 1 tablet (80 mg total) by mouth as needed for flatulence.         Discharge home in stable condition Infant Feeding: Breast Infant Disposition:home with mother Discharge instruction: per After Visit Summary and Postpartum booklet. Activity: Advance as tolerated. Pelvic rest for 6 weeks.  Diet: routine diet Future Appointments: Future Appointments  Date Time Provider [place]  04/19/2021  2:50 PM Florian Buff, MD [facility]  05/18/2021 11:30 AM Myrtis Ser, CNM [facility]   Follow up Visit:  Follow-up Information     Florian Buff, MD Follow up on 04/19/2021.   Specialties: Obstetrics and Gynecology, Radiology Why: at 2:50 pm Contact information: [place] [id number] [phone]         Myrtis Ser, CNM Follow up on 05/18/2021.   Specialty: Obstetrics and Gynecology Why: at 11:30 am Contact information: [place] [place] [address] [phone]                Message sent to [facility] by Dr. Cy Blamer on 04/12/2021  Please schedule this patient for a In person postpartum visit in 4 weeks with the following provider: Any provider. Additional Postpartum F/U:Incision check 1 week (for removal of non-dissolvable sutures from right leg) High risk pregnancy complicated by: HTN Delivery mode:  Vaginal, Spontaneous   Anticipated Birth Control:  BTL done PP  Attestation of Supervision of Student:  I confirm that I have verified the information documented in the  resident's  note and that I have also personally reperformed the history, physical exam and all medical decision making activities.  I have verified that all services and findings are accurately documented in this student's note; and I agree with management and plan as outlined in the documentation. I have also made any necessary editorial changes.   Kerry Hough, PA-C [facility] [employer] 04/13/2021 11:02 AM   Attestation of Attending Supervision of Provider:  Evaluation and management procedures were performed by this provider under my supervision and collaboration. I  have reviewed the provider's note and chart, and I agree with the management and plan.   Laurey Arrow, MD Faculty Practice, [facility]

## 2021-04-13 ENCOUNTER — Other Ambulatory Visit (HOSPITAL_COMMUNITY): Payer: Self-pay

## 2021-04-13 MED ORDER — SIMETHICONE 80 MG PO CHEW
80.0000 mg | CHEWABLE_TABLET | ORAL | 0 refills | Status: DC | PRN
  Filled 2021-04-13: qty 30, 7d supply, fill #0

## 2021-04-13 MED ORDER — SENNOSIDES-DOCUSATE SODIUM 8.6-50 MG PO TABS
2.0000 | ORAL_TABLET | Freq: Every evening | ORAL | 0 refills | Status: DC | PRN
  Filled 2021-04-13: qty 30, 15d supply, fill #0

## 2021-04-13 MED ORDER — IBUPROFEN 600 MG PO TABS
600.0000 mg | ORAL_TABLET | Freq: Four times a day (QID) | ORAL | 0 refills | Status: DC
  Filled 2021-04-13: qty 30, 8d supply, fill #0

## 2021-04-13 MED ORDER — ACETAMINOPHEN 325 MG PO TABS
650.0000 mg | ORAL_TABLET | ORAL | 0 refills | Status: AC | PRN
  Filled 2021-04-13: qty 30, 3d supply, fill #0

## 2021-04-13 MED ORDER — NIFEDIPINE ER 30 MG PO TB24
30.0000 mg | ORAL_TABLET | Freq: Every day | ORAL | 0 refills | Status: DC
  Filled 2021-04-13: qty 90, 90d supply, fill #0

## 2021-04-14 ENCOUNTER — Telehealth: Payer: Self-pay

## 2021-04-14 LAB — SURGICAL PATHOLOGY

## 2021-04-14 NOTE — Telephone Encounter (Signed)
Transition Care Management Unsuccessful Follow-up Telephone Call  Date of discharge and from where:  04/13/2021-[facility]  Attempts:  1st Attempt  Reason for unsuccessful TCM follow-up call:  Left voice message

## 2021-04-15 NOTE — Telephone Encounter (Signed)
Transition Care Management Unsuccessful Follow-up Telephone Call  Date of discharge and from where:  04/13/2021 from [facility]  Attempts:  2nd Attempt  Reason for unsuccessful TCM follow-up call:  Left voice message

## 2021-04-18 NOTE — Telephone Encounter (Signed)
Transition Care Management Follow-up Telephone Call Date of discharge and from where: 04/13/2021 from [facility] How have you been since you were released from the hospital? Pt stated that she is feeling well and did not have any questions or concerns at this time.  Any questions or concerns? No  Items Reviewed: Did the pt receive and understand the discharge instructions provided? Yes  Medications obtained and verified? Yes  Other? No  Any new allergies since your discharge? No  Dietary orders reviewed? No Do you have support at home? Yes   Functional Questionnaire: (I = Independent and D = Dependent) ADLs: I  Bathing/Dressing- I  Meal Prep- I  Eating- I  Maintaining continence- I  Transferring/Ambulation- I  Managing Meds- I   Follow up appointments reviewed:  PCP Hospital f/u appt confirmed? No   Specialist Hospital f/u appt confirmed? Yes  Scheduled to see Duane Lope, MD on 04/19/2021 @ 2:50am. Are transportation arrangements needed? No  If their condition worsens, is the pt aware to call PCP or go to the Emergency Dept.? Yes Was the patient provided with contact information for the PCP's office or ED? Yes Was to pt encouraged to call back with questions or concerns? Yes

## 2021-04-19 ENCOUNTER — Encounter: Payer: Self-pay | Admitting: Obstetrics & Gynecology

## 2021-04-19 ENCOUNTER — Ambulatory Visit (INDEPENDENT_AMBULATORY_CARE_PROVIDER_SITE_OTHER): Payer: Medicaid Other | Admitting: Obstetrics & Gynecology

## 2021-04-19 ENCOUNTER — Other Ambulatory Visit: Payer: Self-pay

## 2021-04-19 VITALS — BP 134/83 | HR 105 | Ht 64.0 in | Wt 197.0 lb

## 2021-04-19 DIAGNOSIS — Z9889 Other specified postprocedural states: Secondary | ICD-10-CM

## 2021-04-19 NOTE — Progress Notes (Signed)
  HPI: Patient returns for routine postoperative follow-up having undergone pp bilateral salpingectomy and removal of right inner thigh abscess on 04/12/21.  The patient's immediate postoperative recovery has been unremarkable. Since hospital discharge the patient reports no problems.   Current Outpatient Medications: acetaminophen (TYLENOL) 325 MG tablet, Take 2 tablets (650 mg total) by mouth every 4 (four) hours as needed (for pain scale < 4)., Disp: 30 tablet, Rfl: 0 Blood Pressure Monitor MISC, For regular home bp monitoring during pregnancy, Disp: 1 each, Rfl: 0 ferrous sulfate 325 (65 FE) MG tablet, Take 1 tablet (325 mg total) by mouth every other day., Disp: 45 tablet, Rfl: 2 ibuprofen (ADVIL) 600 MG tablet, Take 1 tablet (600 mg total) by mouth every 6 (six) hours., Disp: 30 tablet, Rfl: 0 NIFEdipine (ADALAT CC) 30 MG 24 hr tablet, Take 1 tablet (30 mg total) by mouth daily., Disp: 90 tablet, Rfl: 0 senna-docusate (SENOKOT-S) 8.6-50 MG tablet, Take 2 tablets by mouth at bedtime as needed for mild constipation., Disp: 30 tablet, Rfl: 0 simethicone (MYLICON) 80 MG chewable tablet, Chew 1 tablet (80 mg total) by mouth as needed for flatulence., Disp: 30 tablet, Rfl: 0  No current facility-administered medications for this visit.    Blood pressure 134/83, pulse (!) 105, height 5\' 4"  (1.626 m), weight 197 lb (89.4 kg), last menstrual period 07/26/2020, not currently breastfeeding.  Physical Exam: Umbilical incision is well healed  Right thigh healing well sutures removed  Diagnostic Tests:   Pathology: benign  Impression: S/P pp salpingectomy GHTN now on procardia 30 pp   Plan: Pp appt 5 weeks    Follow up: No follow-ups on file.   07/28/2020, MD

## 2021-04-25 ENCOUNTER — Telehealth (HOSPITAL_COMMUNITY): Payer: Self-pay | Admitting: *Deleted

## 2021-04-25 NOTE — Telephone Encounter (Signed)
Mom reports feeling good. No concerns about herself at this time. EPDS=0(Hospital score= 2) Mom reports baby is doing well. Feeding, peeing, and pooping without difficulty. Safe sleep reviewed. Mom reports no concerns about baby at present.  Duffy Rhody, RN 04-25-2021 at 3:49pm

## 2021-05-18 ENCOUNTER — Telehealth: Payer: Medicaid Other | Admitting: Advanced Practice Midwife

## 2021-05-26 ENCOUNTER — Telehealth: Payer: Medicaid Other | Admitting: Advanced Practice Midwife

## 2021-06-07 ENCOUNTER — Other Ambulatory Visit: Payer: Self-pay

## 2021-06-07 ENCOUNTER — Other Ambulatory Visit (HOSPITAL_COMMUNITY)
Admission: RE | Admit: 2021-06-07 | Discharge: 2021-06-07 | Disposition: A | Discharge status: Home / Self Care | Payer: Medicaid Other | Source: Ambulatory Visit | Attending: Obstetrics & Gynecology | Admitting: Obstetrics & Gynecology

## 2021-06-07 ENCOUNTER — Other Ambulatory Visit (INDEPENDENT_AMBULATORY_CARE_PROVIDER_SITE_OTHER): Payer: Medicaid Other | Admitting: *Deleted

## 2021-06-07 DIAGNOSIS — N898 Other specified noninflammatory disorders of vagina: Secondary | ICD-10-CM

## 2021-06-07 NOTE — Progress Notes (Signed)
° °  NURSE VISIT- VAGINITIS/STD/POC  SUBJECTIVE:  Ann Moore is a 31 y.o. [phone] postpartumfemale here for a vaginal swab for STD screen.  She reports the following symptoms: discharge described as creamy for 1 week. Denies abnormal vaginal bleeding, significant pelvic pain, fever, or UTI symptoms.  OBJECTIVE:  There were no vitals taken for this visit.  Appears well, in no apparent distress  ASSESSMENT: Vaginal swab for STD screen  PLAN: Self-collected vaginal probe for Gonorrhea, Chlamydia, Trichomonas, Bacterial Vaginosis, Yeast sent to lab Treatment: to be determined once results are received Follow-up as needed if symptoms persist/worsen, or new symptoms develop  Janece Canterbury  06/07/2021 11:09 AM

## 2021-06-07 NOTE — Progress Notes (Signed)
Chart reviewed for nurse visit. Agree with plan of care.  Cyril Mourning A, NP 06/07/2021 1:20 PM

## 2021-06-08 LAB — CERVICOVAGINAL ANCILLARY ONLY
Bacterial Vaginitis (gardnerella): POSITIVE — AB
Candida Glabrata: NEGATIVE
Candida Vaginitis: NEGATIVE
Chlamydia: NEGATIVE
Comment: NEGATIVE
Comment: NEGATIVE
Comment: NEGATIVE
Comment: NEGATIVE
Comment: NEGATIVE
Comment: NORMAL
Neisseria Gonorrhea: NEGATIVE
Trichomonas: NEGATIVE

## 2021-06-09 ENCOUNTER — Other Ambulatory Visit: Payer: Self-pay | Admitting: Adult Health

## 2021-06-09 MED ORDER — METRONIDAZOLE 500 MG PO TABS: 500.0000 mg | ORAL_TABLET | Freq: Two times a day (BID) | ORAL | 0 refills | Status: DC

## 2021-06-09 NOTE — Progress Notes (Signed)
+  BV on vaginal swab rx flagyl  

## 2021-08-19 DIAGNOSIS — R5383 Other fatigue: Secondary | ICD-10-CM | POA: Diagnosis not present

## 2021-08-19 DIAGNOSIS — F418 Other specified anxiety disorders: Secondary | ICD-10-CM | POA: Diagnosis not present

## 2021-08-19 DIAGNOSIS — R030 Elevated blood-pressure reading, without diagnosis of hypertension: Secondary | ICD-10-CM | POA: Diagnosis not present

## 2021-08-19 DIAGNOSIS — Z13220 Encounter for screening for lipoid disorders: Secondary | ICD-10-CM | POA: Diagnosis not present

## 2021-08-19 DIAGNOSIS — Z136 Encounter for screening for cardiovascular disorders: Secondary | ICD-10-CM | POA: Diagnosis not present

## 2021-08-27 DIAGNOSIS — K219 Gastro-esophageal reflux disease without esophagitis: Secondary | ICD-10-CM | POA: Diagnosis not present

## 2021-08-27 DIAGNOSIS — R079 Chest pain, unspecified: Secondary | ICD-10-CM | POA: Diagnosis not present

## 2021-09-30 DIAGNOSIS — R8290 Unspecified abnormal findings in urine: Secondary | ICD-10-CM | POA: Diagnosis not present

## 2021-09-30 DIAGNOSIS — F418 Other specified anxiety disorders: Secondary | ICD-10-CM | POA: Diagnosis not present

## 2021-09-30 DIAGNOSIS — E785 Hyperlipidemia, unspecified: Secondary | ICD-10-CM | POA: Diagnosis not present

## 2021-11-24 DIAGNOSIS — H5213 Myopia, bilateral: Secondary | ICD-10-CM | POA: Diagnosis not present

## 2022-04-18 DIAGNOSIS — R350 Frequency of micturition: Secondary | ICD-10-CM | POA: Diagnosis not present

## 2022-04-18 DIAGNOSIS — N390 Urinary tract infection, site not specified: Secondary | ICD-10-CM | POA: Diagnosis not present

## 2022-06-21 ENCOUNTER — Ambulatory Visit: Payer: Medicaid Other | Admitting: Family Medicine

## 2022-06-26 ENCOUNTER — Ambulatory Visit: Payer: Self-pay | Admitting: Family Medicine

## 2022-08-17 ENCOUNTER — Ambulatory Visit: Payer: Medicaid Other | Admitting: Family Medicine

## 2022-08-17 ENCOUNTER — Encounter: Payer: Self-pay | Admitting: Family Medicine

## 2022-08-17 VITALS — BP 131/81 | HR 88 | Temp 98.4°F | Ht 64.0 in | Wt 182.0 lb

## 2022-08-17 DIAGNOSIS — R454 Irritability and anger: Secondary | ICD-10-CM

## 2022-08-17 DIAGNOSIS — G43009 Migraine without aura, not intractable, without status migrainosus: Secondary | ICD-10-CM

## 2022-08-17 DIAGNOSIS — F419 Anxiety disorder, unspecified: Secondary | ICD-10-CM | POA: Diagnosis not present

## 2022-08-17 DIAGNOSIS — Z6830 Body mass index (BMI) 30.0-30.9, adult: Secondary | ICD-10-CM

## 2022-08-17 DIAGNOSIS — F339 Major depressive disorder, recurrent, unspecified: Secondary | ICD-10-CM

## 2022-08-17 DIAGNOSIS — E6609 Other obesity due to excess calories: Secondary | ICD-10-CM | POA: Diagnosis not present

## 2022-08-17 DIAGNOSIS — R4589 Other symptoms and signs involving emotional state: Secondary | ICD-10-CM | POA: Diagnosis not present

## 2022-08-17 LAB — LIPID PANEL

## 2022-08-17 MED ORDER — PROPRANOLOL HCL 10 MG PO TABS: 10.0000 mg | ORAL_TABLET | Freq: Three times a day (TID) | ORAL | 1 refills | Status: DC

## 2022-08-17 MED ORDER — DESVENLAFAXINE SUCCINATE ER 50 MG PO TB24: 50.0000 mg | ORAL_TABLET | Freq: Every day | ORAL | 3 refills | Status: DC

## 2022-08-17 MED ORDER — RIZATRIPTAN BENZOATE 10 MG PO TABS: 10.0000 mg | ORAL_TABLET | ORAL | 1 refills | Status: DC | PRN

## 2022-08-17 NOTE — Progress Notes (Signed)
New Patient Office Visit  Subjective    Patient ID: Ann Moore, female    DOB: 06/01/1990  Age: 32 y.o. MRN: [id number]  CC:  Chief Complaint  Patient presents with   New Patient (Initial Visit)    HPI Ann Moore presents to establish care.  She reports a history of anxiety and depression. This is currently uncontrolled and she is having panic attacks 3-4x a day currently with increased anxiety, chest pain, racing heart, and shaking. She has tried Wellbutrin, celexa, prozac, buspar, and lexpro. She reprots that none of these were particularly helpful. She reports difficulty sitting still, fidgeting, feeling easily overstimulated, difficulty focusing, and irritability. She was in the process of being evaluated for adult ADHD but did not complete the testing due to pregnancy.   She also reports a history of migraines without aura. She typically has 2 a month. She has taken maxalt in the past with good relief.      08/17/2022    8:49 AM 01/26/2021    9:36 AM 10/21/2020   11:16 AM  Depression screen PHQ 2/9  Decreased Interest 2 0 1  Down, Depressed, Hopeless 2 0 0  PHQ - 2 Score 4 0 1  Altered sleeping 2 1 1   Tired, decreased energy 3 1 1   Change in appetite 1 0 0  Feeling bad or failure about yourself  2 0 0  Trouble concentrating 3 0 1  Moving slowly or fidgety/restless 1 0 0  Suicidal thoughts 0 0 0  PHQ-9 Score 16 2 4   Difficult doing work/chores Very difficult        08/17/2022    8:50 AM 01/26/2021    9:36 AM 10/21/2020   11:16 AM 02/03/2020   11:24 AM  GAD 7 : Generalized Anxiety Score  Nervous, Anxious, on Edge 3 1 1  0  Control/stop worrying 3 1 1 1   Worry too much - different things 2 0 1 1  Trouble relaxing 2 1 1 1   Restless 1 0 0 1  Easily annoyed or irritable 3 1 1  0  Afraid - awful might happen 2 0 0 0  Total GAD 7 Score 16 4 5 4   Anxiety Difficulty Very difficult   Somewhat difficult      Outpatient Encounter Medications as of 08/17/2022   Medication Sig   acetaminophen (TYLENOL) 325 MG tablet Take 2 tablets (650 mg total) by mouth every 4 (four) hours as needed (for pain scale < 4).   cetirizine (ZYRTEC) 10 MG tablet Take 10 mg by mouth daily.   [DISCONTINUED] Blood Pressure Monitor MISC For regular home bp monitoring during pregnancy   [DISCONTINUED] ferrous sulfate 325 (65 FE) MG tablet Take 1 tablet (325 mg total) by mouth every other day.   [DISCONTINUED] ibuprofen (ADVIL) 600 MG tablet Take 1 tablet (600 mg total) by mouth every 6 (six) hours.   [DISCONTINUED] metroNIDAZOLE (FLAGYL) 500 MG tablet Take 1 tablet (500 mg total) by mouth 2 (two) times daily.   [DISCONTINUED] NIFEdipine (ADALAT CC) 30 MG 24 hr tablet Take 1 tablet (30 mg total) by mouth daily.   [DISCONTINUED] senna-docusate (SENOKOT-S) 8.6-50 MG tablet Take 2 tablets by mouth at bedtime as needed for mild constipation.   [DISCONTINUED] simethicone (MYLICON) 80 MG chewable tablet Chew 1 tablet (80 mg total) by mouth as needed for flatulence.   No facility-administered encounter medications on file as of 08/17/2022.    Past Medical History:  Diagnosis Date   Abnormal  Pap smear of cervix 09/08/2019   08/28/19: ASCUS w/ +HRHPV   Per 2019 ASCCP guidelines, needs colposcopy as soon as possible.  Immediate risk of CIN3+ is 4.4%.    Anxiety    Depression    Headache    Rubella non-immune status, antepartum 03/10/2014   MMR pp    Trichomonal vaginitis 04/29/2015   04/29/2015   POC: neg 07/2015 Repeat positive 10/08/15, POC neg 10/26/2015 04/04/16 + again.      Past Surgical History:  Procedure Laterality Date   DILATION AND CURETTAGE OF UTERUS  2015   TUBAL LIGATION N/A 04/12/2021   Procedure: POST PARTUM TUBAL LIGATION;  Surgeon: Truett Mainland, DO;  Location: [facility];  Service: Gynecology;  Laterality: N/A;    Family History  Problem Relation Age of Onset   Hyperlipidemia Mother    Drug abuse Mother    Hypertension Mother    Diabetes Sister    ADD /  ADHD Son    Asthma Son    ADD / ADHD Son    Cancer Maternal Grandmother    Diabetes Maternal Grandfather    Cancer Maternal Grandfather        liver, lung    Social History   Socioeconomic History   Marital status: Significant Other    Spouse name: Not on file   Number of children: 3   Years of education: 12   Highest education level: Some college, no degree  Occupational History   Not on file  Tobacco Use   Smoking status: Former    Packs/day: 0.50    Years: 7.00    Additional pack years: 0.00    Total pack years: 3.50    Types: Cigarettes    Quit date: 05/30/2019    Years since quitting: 3.2   Smokeless tobacco: Never  Vaping Use   Vaping Use: Every day  Substance and Sexual Activity   Alcohol use: No    Alcohol/week: 0.0 standard drinks of alcohol   Drug use: No   Sexual activity: Not Currently    Birth control/protection: Surgical    Comment: tubal  Other Topics Concern   Not on file  Social History Narrative   Lives with her 3 year son-Axon-father is in an out of photo   78 year old Rodman Key who lives with father [place]       Enjoys: outside, cleaning-likes to organize      Diet: eats all food groups-doesn't eat steak or lobster   Caffeine: 2-3 cans of soda, and ice coffee daily    Water: 6-8 cups daily      Wears seat belt   Does not use phone while driving   Oceanographer at home   Social Determinants of Health   Financial Resource Strain: Low Risk  (01/26/2021)   Overall Financial Resource Strain (CARDIA)    Difficulty of Paying Living Expenses: Not hard at all  Food Insecurity: No Food Insecurity (01/26/2021)   Hunger Vital Sign    Worried About Running Out of Food in the Last Year: Never true    Ran Out of Food in the Last Year: Never true  Transportation Needs: No Transportation Needs (01/26/2021)   PRAPARE - Hydrologist (Medical): No    Lack of Transportation (Non-Medical): No  Physical Activity: Insufficiently Active  (01/26/2021)   Exercise Vital Sign    Days of Exercise per Week: 2 days    Minutes of Exercise per Session: 20 min  Stress: No Stress Concern Present (01/26/2021)   [place]    Feeling of Stress : Only a little  Social Connections: Moderately Isolated (01/26/2021)   Social Connection and Isolation Panel [NHANES]    Frequency of Communication with Friends and Family: More than three times a week    Frequency of Social Gatherings with Friends and Family: Twice a week    Attends Religious Services: Never    Marine scientist or Organizations: No    Attends Archivist Meetings: Never    Marital Status: Living with partner  Intimate Partner Violence: Not At Risk (01/26/2021)   Humiliation, Afraid, Rape, and Kick questionnaire    Fear of Current or Ex-Partner: No    Emotionally Abused: No    Physically Abused: No    Sexually Abused: No    ROS As per HPI.      Objective    BP 131/81   Pulse 88   Temp 98.4 F (36.9 C) (Temporal)   Ht 5\' 4"  (1.626 m)   Wt 182 lb (82.6 kg)   SpO2 95%   BMI 31.24 kg/m   Physical Exam Vitals and nursing note reviewed.  Constitutional:      General: She is not in acute distress.    Appearance: Normal appearance. She is not ill-appearing, toxic-appearing or diaphoretic.  HENT:     Head: Normocephalic and atraumatic.  Cardiovascular:     Rate and Rhythm: Normal rate and regular rhythm.     Heart sounds: Normal heart sounds. No murmur heard. Pulmonary:     Effort: Pulmonary effort is normal. No respiratory distress.     Breath sounds: Normal breath sounds.  Musculoskeletal:     Right lower leg: No edema.     Left lower leg: No edema.  Skin:    General: Skin is warm and dry.  Neurological:     General: No focal deficit present.     Mental Status: She is alert and oriented to person, place, and time.  Psychiatric:        Attention and Perception: Attention  normal.        Mood and Affect: Affect is tearful.        Speech: Speech normal.        Behavior: Behavior normal.        Thought Content: Thought content normal.         Assessment & Plan:   [place] was seen today for new patient (initial visit).  Diagnoses and all orders for this visit:  Anxiety Depression, recurrent ([place]) Uncontrolled. Start pristiq. Can try propanolol TID prn. Labs pending. Referral to [facility] discussed and placed tody.  -     desvenlafaxine (PRISTIQ) 50 MG 24 hr tablet; Take 1 tablet (50 mg total) by mouth daily. -     propranolol (INDERAL) 10 MG tablet; Take 1 tablet (10 mg total) by mouth 3 (three) times daily. -     TSH -     VITAMIN D 25 Hydroxy (Vit-D Deficiency, Fractures) -     Ambulatory referral to Psychiatry  Fidgeting Irritability Referral to [facility] for ADHD evaluation.  -     Ambulatory referral to Psychiatry  Migraine without aura and without status migrainosus, not intractable Maxalt prn.  -     rizatriptan (MAXALT) 10 MG tablet; Take 1 tablet (10 mg total) by mouth as needed for migraine. May repeat in 2 hours if needed  Class 1  obesity due to excess calories without serious comorbidity with body mass index (BMI) of 30.0 to 30.9 in adult Fasting labs completed today. Diet and exercise.  -     CBC with Differential/Platelet -     Lipid panel -     TSH -     CMP14+EGFR -     VITAMIN D 25 Hydroxy (Vit-D Deficiency, Fractures)  Return in about 6 weeks (around 09/28/2022) for medication follow up.   The patient indicates understanding of these issues and agrees with the plan.  Gwenlyn Perking, FNP

## 2022-08-17 NOTE — Patient Instructions (Signed)
Try the generic for Unisom over the counter for sleep as needed.   Increase pristiq to 100 mg daily after 2-3 weeks if no side effects.

## 2022-08-18 ENCOUNTER — Other Ambulatory Visit: Payer: Self-pay | Admitting: Family Medicine

## 2022-08-18 ENCOUNTER — Encounter: Payer: Self-pay | Admitting: Family Medicine

## 2022-08-18 DIAGNOSIS — E559 Vitamin D deficiency, unspecified: Secondary | ICD-10-CM

## 2022-08-18 LAB — CBC WITH DIFFERENTIAL/PLATELET
Basophils Absolute: 0 10*3/uL (ref 0.0–0.2)
Basos: 1 %
EOS (ABSOLUTE): 0.1 10*3/uL (ref 0.0–0.4)
Eos: 1 %
Hematocrit: 41.6 % (ref 34.0–46.6)
Hemoglobin: 13.7 g/dL (ref 11.1–15.9)
Immature Grans (Abs): 0 10*3/uL (ref 0.0–0.1)
Immature Granulocytes: 0 %
Lymphocytes Absolute: 2.5 10*3/uL (ref 0.7–3.1)
Lymphs: 41 %
MCH: 27.8 pg (ref 26.6–33.0)
MCHC: 32.9 g/dL (ref 31.5–35.7)
MCV: 85 fL (ref 79–97)
Monocytes Absolute: 0.5 10*3/uL (ref 0.1–0.9)
Monocytes: 8 %
Neutrophils Absolute: 3 10*3/uL (ref 1.4–7.0)
Neutrophils: 49 %
Platelets: 249 10*3/uL (ref 150–450)
RBC: 4.92 x10E6/uL (ref 3.77–5.28)
RDW: 13.4 % (ref 11.7–15.4)
WBC: 6.1 10*3/uL (ref 3.4–10.8)

## 2022-08-18 LAB — CMP14+EGFR
ALT: 10 IU/L (ref 0–32)
AST: 17 IU/L (ref 0–40)
Albumin/Globulin Ratio: 1.9 (ref 1.2–2.2)
Albumin: 4.7 g/dL (ref 4.0–5.0)
Alkaline Phosphatase: 91 IU/L (ref 44–121)
BUN/Creatinine Ratio: 9 (ref 9–23)
BUN: 8 mg/dL (ref 6–20)
Bilirubin Total: 0.3 mg/dL (ref 0.0–1.2)
CO2: 21 mmol/L (ref 20–29)
Calcium: 9.8 mg/dL (ref 8.7–10.2)
Chloride: 102 mmol/L (ref 96–106)
Creatinine, Ser: 0.88 mg/dL (ref 0.57–1.00)
Globulin, Total: 2.5 g/dL (ref 1.5–4.5)
Glucose: 92 mg/dL (ref 70–99)
Potassium: 4.2 mmol/L (ref 3.5–5.2)
Sodium: 139 mmol/L (ref 134–144)
Total Protein: 7.2 g/dL (ref 6.0–8.5)
eGFR: 91 mL/min/{1.73_m2} (ref >59)

## 2022-08-18 LAB — TSH: TSH: 1.38 u[IU]/mL (ref 0.450–4.500)

## 2022-08-18 LAB — LIPID PANEL
Chol/HDL Ratio: 3.6 ratio (ref 0.0–4.4)
Cholesterol, Total: 186 mg/dL (ref 100–199)
HDL: 51 mg/dL (ref >39)
LDL Chol Calc (NIH): 125 mg/dL — ABNORMAL HIGH (ref 0–99)
Triglycerides: 54 mg/dL (ref 0–149)
VLDL Cholesterol Cal: 10 mg/dL (ref 5–40)

## 2022-08-18 LAB — VITAMIN D 25 HYDROXY (VIT D DEFICIENCY, FRACTURES): Vit D, 25-Hydroxy: 15 ng/mL — ABNORMAL LOW (ref 30.0–100.0)

## 2022-08-18 MED ORDER — VITAMIN D (ERGOCALCIFEROL) 1.25 MG (50000 UNIT) PO CAPS: 50000.0000 [IU] | ORAL_CAPSULE | ORAL | 0 refills | Status: DC

## 2022-08-21 ENCOUNTER — Other Ambulatory Visit: Payer: Self-pay | Admitting: Family Medicine

## 2022-08-21 DIAGNOSIS — F419 Anxiety disorder, unspecified: Secondary | ICD-10-CM

## 2022-08-21 MED ORDER — HYDROXYZINE HCL 10 MG PO TABS: 10.0000 mg | ORAL_TABLET | Freq: Three times a day (TID) | ORAL | 0 refills | Status: DC | PRN

## 2022-09-01 ENCOUNTER — Other Ambulatory Visit: Payer: Self-pay | Admitting: Family Medicine

## 2022-09-01 ENCOUNTER — Telehealth: Payer: Self-pay | Admitting: Family Medicine

## 2022-09-01 DIAGNOSIS — F339 Major depressive disorder, recurrent, unspecified: Secondary | ICD-10-CM

## 2022-09-01 DIAGNOSIS — F419 Anxiety disorder, unspecified: Secondary | ICD-10-CM

## 2022-09-01 MED ORDER — DESVENLAFAXINE SUCCINATE ER 100 MG PO TB24: 100.0000 mg | ORAL_TABLET | Freq: Every day | ORAL | 3 refills | Status: DC

## 2022-09-08 NOTE — Telephone Encounter (Signed)
Pt aware of referral going to [facility] in [place] an is ok with this.

## 2022-09-12 ENCOUNTER — Other Ambulatory Visit: Payer: Self-pay | Admitting: Family Medicine

## 2022-09-12 DIAGNOSIS — F419 Anxiety disorder, unspecified: Secondary | ICD-10-CM

## 2022-09-13 MED ORDER — HYDROXYZINE HCL 10 MG PO TABS: 10.0000 mg | ORAL_TABLET | Freq: Three times a day (TID) | ORAL | 0 refills | Status: DC | PRN

## 2022-09-14 ENCOUNTER — Telehealth: Payer: Self-pay | Admitting: Family Medicine

## 2022-09-14 DIAGNOSIS — F419 Anxiety disorder, unspecified: Secondary | ICD-10-CM

## 2022-09-14 MED ORDER — HYDROXYZINE HCL 25 MG PO TABS: 25.0000 mg | ORAL_TABLET | Freq: Three times a day (TID) | ORAL | 2 refills | Status: DC | PRN

## 2022-09-14 NOTE — Telephone Encounter (Signed)
I have sent in an RX for hydroxyzine 25 mg TID so that she will only need to take 1 tablet at a time. This is a dose change, so insurance should allow her to fill it now.

## 2022-09-14 NOTE — Telephone Encounter (Signed)
Patient is completely out of  hydrOXYzine (ATARAX) 10 MG tablet and has not had any since 4/19, she said that she discussed taking 2 instead of 1 - 3 times a day. She stated that she had talked to PCP about this and she is unable to fill her medication at [facility] until 4/20 because of her taking 2 instead of 1. Can the directions be changed and resent to the pharmacy?

## 2022-09-14 NOTE — Telephone Encounter (Signed)
Spoke with pt, she is aware

## 2022-09-28 ENCOUNTER — Ambulatory Visit: Payer: Medicaid Other | Admitting: Family Medicine

## 2022-09-29 ENCOUNTER — Encounter: Payer: Self-pay | Admitting: Family Medicine

## 2022-09-29 ENCOUNTER — Ambulatory Visit: Payer: Medicaid Other | Admitting: Family Medicine

## 2022-09-29 VITALS — BP 127/79 | HR 75 | Temp 97.9°F | Ht 64.0 in | Wt 175.2 lb

## 2022-09-29 DIAGNOSIS — G43009 Migraine without aura, not intractable, without status migrainosus: Secondary | ICD-10-CM | POA: Diagnosis not present

## 2022-09-29 DIAGNOSIS — F339 Major depressive disorder, recurrent, unspecified: Secondary | ICD-10-CM | POA: Diagnosis not present

## 2022-09-29 DIAGNOSIS — F419 Anxiety disorder, unspecified: Secondary | ICD-10-CM

## 2022-09-29 DIAGNOSIS — F410 Panic disorder [episodic paroxysmal anxiety] without agoraphobia: Secondary | ICD-10-CM | POA: Diagnosis not present

## 2022-09-29 DIAGNOSIS — F331 Major depressive disorder, recurrent, moderate: Secondary | ICD-10-CM | POA: Insufficient documentation

## 2022-09-29 DIAGNOSIS — F332 Major depressive disorder, recurrent severe without psychotic features: Secondary | ICD-10-CM | POA: Insufficient documentation

## 2022-09-29 MED ORDER — BUSPIRONE HCL 5 MG PO TABS: 5.0000 mg | ORAL_TABLET | Freq: Two times a day (BID) | ORAL | 0 refills | Status: DC

## 2022-09-29 NOTE — Progress Notes (Signed)
Established Patient Office Visit  Subjective   Patient ID: Ann Moore, female    DOB: 04-19-1991  Age: 32 y.o. MRN: [id number]  Chief Complaint  Patient presents with   Anxiety   Depression   Migraine    HPI Ann Moore is here for follow up of anxiety and depression. She is currently on pristiq 100 mg daily. She is also taking hydroxyzine 25 mg TID. She reprots that her symptoms haven't really improved but they haven't worsened either. She continues to have frequent panic attacks. She doesn't feel like hydroxyzine has been very beneficial, but does notice increased anxiety if she doesn't take hydroxyzine. She did try propanolol but didn't tolerate it due to side effects. She does have an appt scheduled with Dr. Adrian Blackwater on 10/18/22 for evaluation. She has failed numerous other SSRIs in the past.   She does reports that her migraines have improved with good relief with rizatriptan prn.      09/29/2022    8:51 AM 08/17/2022    8:49 AM 01/26/2021    9:36 AM  Depression screen PHQ 2/9  Decreased Interest 2 2 0  Down, Depressed, Hopeless 2 2 0  PHQ - 2 Score 4 4 0  Altered sleeping 1 2 1   Tired, decreased energy 3 3 1   Change in appetite 3 1 0  Feeling bad or failure about yourself  1 2 0  Trouble concentrating 3 3 0  Moving slowly or fidgety/restless 1 1 0  Suicidal thoughts 0 0 0  PHQ-9 Score 16 16 2   Difficult doing work/chores Very difficult Very difficult       09/29/2022    8:51 AM 08/17/2022    8:50 AM 01/26/2021    9:36 AM 10/21/2020   11:16 AM  GAD 7 : Generalized Anxiety Score  Nervous, Anxious, on Edge 2 3 1 1   Control/stop worrying 2 3 1 1   Worry too much - different things 2 2 0 1  Trouble relaxing 3 2 1 1   Restless 2 1 0 0  Easily annoyed or irritable 3 3 1 1   Afraid - awful might happen 1 2 0 0  Total GAD 7 Score 15 16 4 5   Anxiety Difficulty Very difficult Very difficult         ROS As per HPI.     Objective:     BP 127/79   Pulse 75   Temp 97.9  F (36.6 C) (Temporal)   Ht 5\' 4"  (1.626 m)   Wt 175 lb 4 oz (79.5 kg)   SpO2 96%   BMI 30.08 kg/m    Physical Exam Vitals and nursing note reviewed.  Constitutional:      General: She is not in acute distress.    Appearance: Normal appearance. She is not ill-appearing, toxic-appearing or diaphoretic.  Cardiovascular:     Rate and Rhythm: Regular rhythm.     Heart sounds: Normal heart sounds. No murmur heard. Pulmonary:     Effort: Pulmonary effort is normal. No respiratory distress.     Breath sounds: Normal breath sounds.  Musculoskeletal:     Right lower leg: No edema.     Left lower leg: No edema.  Skin:    General: Skin is warm and dry.  Neurological:     General: No focal deficit present.     Mental Status: She is alert and oriented to person, place, and time.  Psychiatric:        Mood and Affect: Mood  normal.        Behavior: Behavior normal.        Thought Content: Thought content normal.        Judgment: Judgment normal.      No results found for any visits on 09/29/22.    The ASCVD Risk score (Arnett DK, et al., 2019) failed to calculate for the following reasons:   The 2019 ASCVD risk score is only valid for ages 39 to 69    Assessment & Plan:   Ann Moore was seen today for anxiety, depression and migraine.  Diagnoses and all orders for this visit:  Depression, recurrent (HCC) Anxiety Panic attacks Uncontrolled. Denies SI. Continue pristiq. Will add buspar today. Continue hydroxyzine prn. Discussed to keep appointment with Dr. Adrian Blackwater for further evaluation and management. Discussed that he may change regimen after evaluation.  -     busPIRone (BUSPAR) 5 MG tablet; Take 1 tablet (5 mg total) by mouth 2 (two) times daily.   Migraine without aura Well controlled on current regimen.    Return in about 3 months (around 12/30/2022) for medication follow up.   The patient indicates understanding of these issues and agrees with the plan.  Gabriel Earing, FNP

## 2022-10-18 ENCOUNTER — Ambulatory Visit (INDEPENDENT_AMBULATORY_CARE_PROVIDER_SITE_OTHER): Payer: Medicaid Other | Admitting: Psychiatry

## 2022-10-18 ENCOUNTER — Encounter (HOSPITAL_COMMUNITY): Payer: Self-pay | Admitting: Psychiatry

## 2022-10-18 DIAGNOSIS — F15982 Other stimulant use, unspecified with stimulant-induced sleep disorder: Secondary | ICD-10-CM

## 2022-10-18 DIAGNOSIS — F332 Major depressive disorder, recurrent severe without psychotic features: Secondary | ICD-10-CM | POA: Diagnosis not present

## 2022-10-18 DIAGNOSIS — G43909 Migraine, unspecified, not intractable, without status migrainosus: Secondary | ICD-10-CM | POA: Insufficient documentation

## 2022-10-18 DIAGNOSIS — Z720 Tobacco use: Secondary | ICD-10-CM

## 2022-10-18 DIAGNOSIS — F4310 Post-traumatic stress disorder, unspecified: Secondary | ICD-10-CM | POA: Diagnosis not present

## 2022-10-18 DIAGNOSIS — Z789 Other specified health status: Secondary | ICD-10-CM

## 2022-10-18 DIAGNOSIS — R0683 Snoring: Secondary | ICD-10-CM | POA: Diagnosis not present

## 2022-10-18 DIAGNOSIS — F411 Generalized anxiety disorder: Secondary | ICD-10-CM

## 2022-10-18 DIAGNOSIS — F410 Panic disorder [episodic paroxysmal anxiety] without agoraphobia: Secondary | ICD-10-CM | POA: Diagnosis not present

## 2022-10-18 DIAGNOSIS — F603 Borderline personality disorder: Secondary | ICD-10-CM | POA: Diagnosis not present

## 2022-10-18 DIAGNOSIS — R634 Abnormal weight loss: Secondary | ICD-10-CM | POA: Diagnosis not present

## 2022-10-18 HISTORY — DX: Other specified health status: Z78.9

## 2022-10-18 HISTORY — DX: Abnormal weight loss: R63.4

## 2022-10-18 HISTORY — DX: Other stimulant use, unspecified with stimulant-induced sleep disorder: F15.982

## 2022-10-18 MED ORDER — MIRTAZAPINE 15 MG PO TABS: 15.0000 mg | ORAL_TABLET | Freq: Every day | ORAL | 1 refills | Status: DC
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
## 2022-10-18 NOTE — Patient Instructions (Addendum)
We added Remeron (mirtazapine) 15 mg nightly to your regimen today.  This should begin to help with some of your insomnia and low appetite.  It will also begin to help with the anxiety and depression and as we increased the dose will help with these more more.  In the meantime please start to decrease her caffeine intake by half a bottle or can every 5 days to avoid the withdrawal headache.  The goal will be 1 unit of caffeine daily before 12 PM.  I will coordinate with your PCP to get further blood work as well as a nutrition referral.  As we discussed dialectical behavioral therapy (DBT) is a therapy modality that has the most research for benefiting borderline personality disorder.  Call your insurer to find a provider that is in network and in the meantime you can use this website to begin to work through the workbook itself: [email or web address].[email or web address]

## 2022-10-25 ENCOUNTER — Encounter (HOSPITAL_COMMUNITY): Payer: Self-pay

## 2022-10-31 ENCOUNTER — Telehealth (INDEPENDENT_AMBULATORY_CARE_PROVIDER_SITE_OTHER): Payer: Medicaid Other | Admitting: Psychiatry

## 2022-10-31 ENCOUNTER — Encounter (HOSPITAL_COMMUNITY): Payer: Self-pay | Admitting: Psychiatry

## 2022-10-31 DIAGNOSIS — F411 Generalized anxiety disorder: Secondary | ICD-10-CM | POA: Diagnosis not present

## 2022-10-31 DIAGNOSIS — R634 Abnormal weight loss: Secondary | ICD-10-CM

## 2022-10-31 DIAGNOSIS — Z789 Other specified health status: Secondary | ICD-10-CM | POA: Diagnosis not present

## 2022-10-31 DIAGNOSIS — R0683 Snoring: Secondary | ICD-10-CM

## 2022-10-31 DIAGNOSIS — F15982 Other stimulant use, unspecified with stimulant-induced sleep disorder: Secondary | ICD-10-CM | POA: Diagnosis not present

## 2022-10-31 DIAGNOSIS — F410 Panic disorder [episodic paroxysmal anxiety] without agoraphobia: Secondary | ICD-10-CM | POA: Diagnosis not present

## 2022-10-31 DIAGNOSIS — F4310 Post-traumatic stress disorder, unspecified: Secondary | ICD-10-CM | POA: Diagnosis not present

## 2022-10-31 DIAGNOSIS — F603 Borderline personality disorder: Secondary | ICD-10-CM | POA: Diagnosis not present

## 2022-10-31 DIAGNOSIS — F332 Major depressive disorder, recurrent severe without psychotic features: Secondary | ICD-10-CM

## 2022-10-31 DIAGNOSIS — Z720 Tobacco use: Secondary | ICD-10-CM

## 2022-10-31 DIAGNOSIS — F17210 Nicotine dependence, cigarettes, uncomplicated: Secondary | ICD-10-CM | POA: Diagnosis not present

## 2022-10-31 MED ORDER — QUETIAPINE FUMARATE 25 MG PO TABS: 25.0000 mg | ORAL_TABLET | Freq: Two times a day (BID) | ORAL | 2 refills | Status: DC
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
## 2022-10-31 NOTE — Patient Instructions (Signed)
We discontinued hydroxyzine in favor of starting Seroquel (quetiapine) 25 mg twice a day as needed for anxiety/panic/insomnia.  Try taking it for the first time at night to see if it makes you too drowsy or not.  If it does you can try cutting the pill in half.  Try to prioritize finding a DBT provider and continue to work in the DBT manual I sent you.  Keep cutting back on caffeine and it will help with both sleep and anxiety.  I will reach out to her PCP to help set up getting an EKG, lipid panel, A1c.

## 2022-11-17 ENCOUNTER — Encounter (HOSPITAL_COMMUNITY): Payer: Self-pay | Admitting: Psychiatry

## 2022-11-17 ENCOUNTER — Telehealth (INDEPENDENT_AMBULATORY_CARE_PROVIDER_SITE_OTHER): Payer: Medicaid Other | Admitting: Psychiatry

## 2022-11-17 DIAGNOSIS — F410 Panic disorder [episodic paroxysmal anxiety] without agoraphobia: Secondary | ICD-10-CM | POA: Diagnosis not present

## 2022-11-17 DIAGNOSIS — Z79899 Other long term (current) drug therapy: Secondary | ICD-10-CM

## 2022-11-17 DIAGNOSIS — F603 Borderline personality disorder: Secondary | ICD-10-CM

## 2022-11-17 DIAGNOSIS — R634 Abnormal weight loss: Secondary | ICD-10-CM | POA: Diagnosis not present

## 2022-11-17 DIAGNOSIS — R0683 Snoring: Secondary | ICD-10-CM

## 2022-11-17 DIAGNOSIS — F4310 Post-traumatic stress disorder, unspecified: Secondary | ICD-10-CM | POA: Diagnosis not present

## 2022-11-17 DIAGNOSIS — F331 Major depressive disorder, recurrent, moderate: Secondary | ICD-10-CM

## 2022-11-17 DIAGNOSIS — F411 Generalized anxiety disorder: Secondary | ICD-10-CM

## 2022-11-17 DIAGNOSIS — F17210 Nicotine dependence, cigarettes, uncomplicated: Secondary | ICD-10-CM

## 2022-11-17 DIAGNOSIS — Z720 Tobacco use: Secondary | ICD-10-CM

## 2022-11-17 DIAGNOSIS — F15982 Other stimulant use, unspecified with stimulant-induced sleep disorder: Secondary | ICD-10-CM

## 2022-11-17 HISTORY — DX: Other long term (current) drug therapy: Z79.899

## 2022-11-17 MED ORDER — QUETIAPINE FUMARATE 25 MG PO TABS: 25.0000 mg | ORAL_TABLET | Freq: Three times a day (TID) | ORAL | 2 refills | Status: DC

## 2022-11-17 MED ORDER — MIRTAZAPINE 15 MG PO TABS: 22.5000 mg | ORAL_TABLET | Freq: Every day | ORAL | 1 refills | Status: DC
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
## 2022-11-17 NOTE — Patient Instructions (Addendum)
We increased the Seroquel to 25 mg 3 times a day as needed for anxiety/panic.  It is a week to be a new system before increasing to 22.5 mg of mirtazapine nightly.  Keep up the good work with cutting back on caffeine and searching for a DBT provider.  Also good job with improving your nutrition.  When you get a chance try to coordinate with your PCP to get an EKG and if they can also check a vitamin D, B12, folate, iron panel that would be good.  He may also benefit from getting a nutrition referral.

## 2022-12-07 ENCOUNTER — Encounter (HOSPITAL_COMMUNITY): Payer: Self-pay

## 2022-12-25 ENCOUNTER — Encounter (HOSPITAL_COMMUNITY): Payer: Self-pay | Admitting: Psychiatry

## 2022-12-25 ENCOUNTER — Telehealth (INDEPENDENT_AMBULATORY_CARE_PROVIDER_SITE_OTHER): Payer: Medicaid Other | Admitting: Psychiatry

## 2022-12-25 DIAGNOSIS — F15982 Other stimulant use, unspecified with stimulant-induced sleep disorder: Secondary | ICD-10-CM

## 2022-12-25 DIAGNOSIS — F4310 Post-traumatic stress disorder, unspecified: Secondary | ICD-10-CM

## 2022-12-25 DIAGNOSIS — F410 Panic disorder [episodic paroxysmal anxiety] without agoraphobia: Secondary | ICD-10-CM

## 2022-12-25 DIAGNOSIS — E559 Vitamin D deficiency, unspecified: Secondary | ICD-10-CM

## 2022-12-25 DIAGNOSIS — F17290 Nicotine dependence, other tobacco product, uncomplicated: Secondary | ICD-10-CM | POA: Diagnosis not present

## 2022-12-25 DIAGNOSIS — F603 Borderline personality disorder: Secondary | ICD-10-CM | POA: Diagnosis not present

## 2022-12-25 DIAGNOSIS — Z79899 Other long term (current) drug therapy: Secondary | ICD-10-CM

## 2022-12-25 DIAGNOSIS — F1091 Alcohol use, unspecified, in remission: Secondary | ICD-10-CM

## 2022-12-25 DIAGNOSIS — F331 Major depressive disorder, recurrent, moderate: Secondary | ICD-10-CM

## 2022-12-25 DIAGNOSIS — R634 Abnormal weight loss: Secondary | ICD-10-CM | POA: Diagnosis not present

## 2022-12-25 DIAGNOSIS — F1590 Other stimulant use, unspecified, uncomplicated: Secondary | ICD-10-CM

## 2022-12-25 DIAGNOSIS — F411 Generalized anxiety disorder: Secondary | ICD-10-CM

## 2022-12-25 DIAGNOSIS — F1221 Cannabis dependence, in remission: Secondary | ICD-10-CM

## 2022-12-25 DIAGNOSIS — Z720 Tobacco use: Secondary | ICD-10-CM

## 2022-12-25 MED ORDER — MIRTAZAPINE 15 MG PO TABS
15.0000 mg | ORAL_TABLET | Freq: Every day | ORAL | 2 refills | Status: DC
Start: 2022-12-25 — End: 2023-01-25

## 2022-12-25 MED ORDER — QUETIAPINE FUMARATE 25 MG PO TABS
ORAL_TABLET | ORAL | 2 refills | Status: DC
Start: 2022-12-25 — End: 2023-01-25
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
## 2022-12-25 NOTE — Patient Instructions (Addendum)
We increased the seroquel to 25mg  three times daily with a fourth dose to be used as needed for anxiety or insomnia. Keep cutting back on caffeine and vaping gradually and this should help with the irritability and insomnia.  Keep working through the [employer] and that will help him sleep with the thought component of your borderline personality disorder which does not typically respond to much medication intervention.  You can also change her search to look for a CBT (cognitive behavioral therapy) provider at that may be easier to find than a DBT provider.  Please coordinate with your PCP to get an updated EKG and A1c while you are on the Seroquel.

## 2023-01-01 ENCOUNTER — Other Ambulatory Visit (INDEPENDENT_AMBULATORY_CARE_PROVIDER_SITE_OTHER): Payer: Medicaid Other

## 2023-01-01 ENCOUNTER — Ambulatory Visit (INDEPENDENT_AMBULATORY_CARE_PROVIDER_SITE_OTHER): Payer: Medicaid Other | Admitting: Family Medicine

## 2023-01-01 ENCOUNTER — Encounter: Payer: Self-pay | Admitting: Family Medicine

## 2023-01-01 VITALS — BP 132/87 | HR 77 | Temp 97.9°F | Ht 64.0 in | Wt 173.4 lb

## 2023-01-01 DIAGNOSIS — F339 Major depressive disorder, recurrent, unspecified: Secondary | ICD-10-CM | POA: Diagnosis not present

## 2023-01-01 DIAGNOSIS — R6889 Other general symptoms and signs: Secondary | ICD-10-CM | POA: Diagnosis not present

## 2023-01-01 DIAGNOSIS — R9431 Abnormal electrocardiogram [ECG] [EKG]: Secondary | ICD-10-CM | POA: Diagnosis not present

## 2023-01-01 DIAGNOSIS — R002 Palpitations: Secondary | ICD-10-CM

## 2023-01-01 DIAGNOSIS — E559 Vitamin D deficiency, unspecified: Secondary | ICD-10-CM | POA: Diagnosis not present

## 2023-01-01 DIAGNOSIS — R630 Anorexia: Secondary | ICD-10-CM

## 2023-01-01 DIAGNOSIS — F419 Anxiety disorder, unspecified: Secondary | ICD-10-CM

## 2023-01-01 DIAGNOSIS — R300 Dysuria: Secondary | ICD-10-CM

## 2023-01-01 DIAGNOSIS — Z79899 Other long term (current) drug therapy: Secondary | ICD-10-CM | POA: Diagnosis not present

## 2023-01-01 DIAGNOSIS — G43009 Migraine without aura, not intractable, without status migrainosus: Secondary | ICD-10-CM

## 2023-01-01 LAB — ANEMIA PROFILE B
Basophils Absolute: 0 10*3/uL (ref 0.0–0.2)
Basos: 0 %
EOS (ABSOLUTE): 0.1 10*3/uL (ref 0.0–0.4)
Eos: 1 %
Hematocrit: 38.6 % (ref 34.0–46.6)
Hemoglobin: 12.7 g/dL (ref 11.1–15.9)
Immature Grans (Abs): 0 10*3/uL (ref 0.0–0.1)
Immature Granulocytes: 0 %
Lymphocytes Absolute: 2.7 10*3/uL (ref 0.7–3.1)
Lymphs: 38 %
MCH: 28.3 pg (ref 26.6–33.0)
MCHC: 32.9 g/dL (ref 31.5–35.7)
MCV: 86 fL (ref 79–97)
Monocytes Absolute: 0.6 10*3/uL (ref 0.1–0.9)
Monocytes: 9 %
Neutrophils Absolute: 3.7 10*3/uL (ref 1.4–7.0)
Neutrophils: 52 %
Platelets: 258 10*3/uL (ref 150–450)
RBC: 4.48 x10E6/uL (ref 3.77–5.28)
RDW: 13.7 % (ref 11.7–15.4)
Retic Ct Pct: 1.6 % (ref 0.6–2.6)
WBC: 7.1 10*3/uL (ref 3.4–10.8)

## 2023-01-01 LAB — CMP14+EGFR

## 2023-01-01 LAB — TSH

## 2023-01-01 LAB — VITAMIN D 25 HYDROXY (VIT D DEFICIENCY, FRACTURES)

## 2023-01-01 NOTE — Progress Notes (Signed)
Established Patient Office Visit  Subjective   Patient ID: Ann Moore, female    DOB: 02/22/1991  Age: 32 y.o. MRN: [id number]  Chief Complaint  Patient presents with   Medical Management of Chronic Issues   Migraine    HPI Ann Moore is here for a follow up. She has been follow up with [facility] for anxiety and depression. She does feel like this is slowly improving. Her appetite has improved and she doesn't feel like she struggles with her appetite anymore. No significant weight loss since her last visit.   She had an energy drink prior to her visit today. Reports some palpations associated with anxiety. Denies palpations otherwise, shortness of breath, chest pain, dizziness.   Reports sleep is up and down. Partner reports that she does snore sometimes, but not regularly. Denies witness apnea with sleep. Denies family hx of OSA.   Migraines has been more frequent for the last month as she has been under more stress due some health concerns for her daughter. She reports that rizatriptan does relieve migraines. Typically has a few a month at most and feels they are generally well controlled.   Dysuria since this morning. She also reports frequency, urgency, and hesitancy last night. Hx of UTIs. Symptoms are worsening. No fever, chills, flank pain, nausea, or vomiting. No hematuria.      01/01/2023    9:07 AM 10/18/2022   10:34 AM 09/29/2022    8:51 AM 08/17/2022    8:49 AM 01/26/2021    9:36 AM  Depression screen PHQ 2/9  Decreased Interest 1  2 2  0  Down, Depressed, Hopeless 1  2 2  0  PHQ - 2 Score 2  4 4  0  Altered sleeping 1  1 2 1   Tired, decreased energy 1  3 3 1   Change in appetite 0  3 1 0  Feeling bad or failure about yourself  0  1 2 0  Trouble concentrating 1  3 3  0  Moving slowly or fidgety/restless 0  1 1 0  Suicidal thoughts 0  0 0 0  PHQ-9 Score 5  16 16 2   Difficult doing work/chores Somewhat difficult  Very difficult Very difficult      Information is confidential  and restricted. Go to Review Flowsheets to unlock data.      01/01/2023    9:08 AM 09/29/2022    8:51 AM 08/17/2022    8:50 AM 01/26/2021    9:36 AM  GAD 7 : Generalized Anxiety Score  Nervous, Anxious, on Edge 1 2 3 1   Control/stop worrying 1 2 3 1   Worry too much - different things 1 2 2  0  Trouble relaxing 1 3 2 1   Restless 1 2 1  0  Easily annoyed or irritable 1 3 3 1   Afraid - awful might happen 1 1 2  0  Total GAD 7 Score 7 15 16 4   Anxiety Difficulty Somewhat difficult Very difficult Very difficult        ROS As per HPI.    Objective:     BP 132/87   Pulse 77   Temp 97.9 F (36.6 C) (Temporal)   Ht 5\' 4"  (1.626 m)   Wt 173 lb 6 oz (78.6 kg)   SpO2 100%   BMI 29.76 kg/m  Wt Readings from Last 3 Encounters:  01/01/23 173 lb 6 oz (78.6 kg)  09/29/22 175 lb 4 oz (79.5 kg)  08/17/22 182 lb (82.6 kg)  Physical Exam Vitals and nursing note reviewed.  Constitutional:      General: She is not in acute distress.    Appearance: Normal appearance. She is not ill-appearing, toxic-appearing or diaphoretic.  Cardiovascular:     Rate and Rhythm: Normal rate and regular rhythm.     Heart sounds: Normal heart sounds. No murmur heard. Pulmonary:     Effort: Pulmonary effort is normal. No respiratory distress.     Breath sounds: Normal breath sounds. No wheezing.  Abdominal:     General: Bowel sounds are normal. There is no distension.     Palpations: Abdomen is soft.     Tenderness: There is no abdominal tenderness. There is no right CVA tenderness, left CVA tenderness, guarding or rebound.  Musculoskeletal:     Right lower leg: No edema.     Left lower leg: No edema.  Skin:    General: Skin is warm and dry.  Neurological:     General: No focal deficit present.     Mental Status: She is alert and oriented to person, place, and time.  Psychiatric:        Mood and Affect: Mood normal.        Behavior: Behavior normal.        Thought Content: Thought content  normal.        Judgment: Judgment normal.      No results found for any visits on 01/01/23.    The ASCVD Risk score (Arnett DK, et al., 2019) failed to calculate for the following reasons:   The 2019 ASCVD risk score is only valid for ages 47 to 56    Assessment & Plan:   Ann Moore was seen today for medical management of chronic issues and migraine.  Diagnoses and all orders for this visit:  Migraine without aura and without status migrainosus, not intractable Well controlled on current regimen.   Dysuria UA pending.  -     Urinalysis, Routine w reflex microscopic  Depression, recurrent (HCC) Anxiety Managed by [facility]. Improving. Denies SI.   High risk medication use EKG with short PR interval today, no previous EKG on file for comparison. No QT prolongation. She does report palpations with anxiety. Zio applied today for further evaluation.  -     EKG 12-Lead  Shortened PR interval -     LONG TERM MONITOR (3-14 DAYS); Future  Palpitations -     LONG TERM MONITOR (3-14 DAYS); Future  Vitamin D deficiency On supplement. Labs pending.       -     VITAMIN D 25 Hydroxy (Vit-D Deficiency, Fractures)  Decreased appetite Reports improved since last visit with no significant weigh loss since last visit. Will check labs as below. Declined nutrition referral today.  -     Anemia Profile B -     TSH -     CMP14+EGFR  Return in about 6 months (around 07/04/2023) for CPE.   The patient indicates understanding of these issues and agrees with the plan.  Gabriel Earing, FNP

## 2023-01-03 ENCOUNTER — Other Ambulatory Visit: Payer: Self-pay | Admitting: Family Medicine

## 2023-01-03 DIAGNOSIS — E538 Deficiency of other specified B group vitamins: Secondary | ICD-10-CM

## 2023-01-03 DIAGNOSIS — E559 Vitamin D deficiency, unspecified: Secondary | ICD-10-CM

## 2023-01-03 MED ORDER — VITAMIN D (ERGOCALCIFEROL) 1.25 MG (50000 UNIT) PO CAPS
50000.0000 [IU] | ORAL_CAPSULE | ORAL | 0 refills | Status: DC
Start: 2023-01-03 — End: 2023-04-09

## 2023-01-03 MED ORDER — FOLIC ACID 1 MG PO TABS
1.0000 mg | ORAL_TABLET | Freq: Every day | ORAL | 1 refills | Status: AC
Start: 2023-01-03 — End: ?

## 2023-01-25 ENCOUNTER — Telehealth (INDEPENDENT_AMBULATORY_CARE_PROVIDER_SITE_OTHER): Payer: Medicaid Other | Admitting: Psychiatry

## 2023-01-25 ENCOUNTER — Encounter (HOSPITAL_COMMUNITY): Payer: Self-pay | Admitting: Psychiatry

## 2023-01-25 DIAGNOSIS — R0683 Snoring: Secondary | ICD-10-CM

## 2023-01-25 DIAGNOSIS — F411 Generalized anxiety disorder: Secondary | ICD-10-CM

## 2023-01-25 DIAGNOSIS — Z79899 Other long term (current) drug therapy: Secondary | ICD-10-CM

## 2023-01-25 DIAGNOSIS — F4310 Post-traumatic stress disorder, unspecified: Secondary | ICD-10-CM | POA: Diagnosis not present

## 2023-01-25 DIAGNOSIS — Z720 Tobacco use: Secondary | ICD-10-CM

## 2023-01-25 DIAGNOSIS — F17290 Nicotine dependence, other tobacco product, uncomplicated: Secondary | ICD-10-CM

## 2023-01-25 DIAGNOSIS — F909 Attention-deficit hyperactivity disorder, unspecified type: Secondary | ICD-10-CM | POA: Diagnosis not present

## 2023-01-25 DIAGNOSIS — F331 Major depressive disorder, recurrent, moderate: Secondary | ICD-10-CM | POA: Diagnosis not present

## 2023-01-25 DIAGNOSIS — F603 Borderline personality disorder: Secondary | ICD-10-CM | POA: Diagnosis not present

## 2023-01-25 DIAGNOSIS — F15982 Other stimulant use, unspecified with stimulant-induced sleep disorder: Secondary | ICD-10-CM

## 2023-01-25 DIAGNOSIS — F410 Panic disorder [episodic paroxysmal anxiety] without agoraphobia: Secondary | ICD-10-CM | POA: Diagnosis not present

## 2023-01-25 MED ORDER — QUETIAPINE FUMARATE 50 MG PO TABS
ORAL_TABLET | ORAL | 2 refills | Status: DC
Start: 2023-01-25 — End: 2023-03-06

## 2023-01-25 MED ORDER — GUANFACINE HCL ER 1 MG PO TB24
ORAL_TABLET | ORAL | 1 refills | Status: DC
Start: 2023-01-25 — End: 2023-02-01

## 2023-01-25 MED ORDER — MIRTAZAPINE 15 MG PO TABS
15.0000 mg | ORAL_TABLET | Freq: Every day | ORAL | 2 refills | Status: AC
Start: 2023-01-25 — End: ?

## 2023-01-25 NOTE — Patient Instructions (Signed)
We changed the Seroquel to be for 50 mg twice daily with a third dose that you can either take at a 50 mg dose or in half at a 25mg  dose as needed for insomnia or anger/panic.  We also added guanfacine ER (Intuniv) 1 mg daily to your regimen today.  After 10 days he can increase to 2 mg once daily.
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
## 2023-02-01 ENCOUNTER — Telehealth (HOSPITAL_COMMUNITY): Payer: Self-pay | Admitting: Psychiatry

## 2023-02-01 DIAGNOSIS — F909 Attention-deficit hyperactivity disorder, unspecified type: Secondary | ICD-10-CM

## 2023-02-01 MED ORDER — GUANFACINE HCL ER 1 MG PO TB24
ORAL_TABLET | ORAL | 1 refills | Status: AC
Start: 2023-02-01 — End: ?

## 2023-02-01 NOTE — Telephone Encounter (Signed)
Pharmacy only filled 10 tablets due to reading the instructions wrong. Prescription resent.

## 2023-02-27 ENCOUNTER — Telehealth (INDEPENDENT_AMBULATORY_CARE_PROVIDER_SITE_OTHER): Payer: Medicaid Other | Admitting: Psychiatry

## 2023-02-27 ENCOUNTER — Encounter (HOSPITAL_COMMUNITY): Payer: Self-pay | Admitting: Psychiatry

## 2023-02-27 DIAGNOSIS — F603 Borderline personality disorder: Secondary | ICD-10-CM | POA: Diagnosis not present

## 2023-02-27 DIAGNOSIS — F909 Attention-deficit hyperactivity disorder, unspecified type: Secondary | ICD-10-CM

## 2023-02-27 DIAGNOSIS — F4310 Post-traumatic stress disorder, unspecified: Secondary | ICD-10-CM

## 2023-02-27 DIAGNOSIS — Z720 Tobacco use: Secondary | ICD-10-CM | POA: Diagnosis not present

## 2023-02-27 DIAGNOSIS — F411 Generalized anxiety disorder: Secondary | ICD-10-CM

## 2023-02-27 DIAGNOSIS — Z79899 Other long term (current) drug therapy: Secondary | ICD-10-CM | POA: Diagnosis not present

## 2023-02-27 DIAGNOSIS — F410 Panic disorder [episodic paroxysmal anxiety] without agoraphobia: Secondary | ICD-10-CM

## 2023-02-27 DIAGNOSIS — F331 Major depressive disorder, recurrent, moderate: Secondary | ICD-10-CM

## 2023-02-27 MED ORDER — DESVENLAFAXINE SUCCINATE ER 50 MG PO TB24
50.0000 mg | ORAL_TABLET | Freq: Every day | ORAL | 0 refills | Status: DC
Start: 2023-02-27 — End: 2023-03-06

## 2023-02-27 MED ORDER — ATOMOXETINE HCL 40 MG PO CAPS
ORAL_CAPSULE | ORAL | 2 refills | Status: DC
Start: 2023-02-27 — End: 2023-04-09

## 2023-02-27 NOTE — Patient Instructions (Addendum)
We decreased the Pristiq to 50 mg once daily for 1 week.  Then you will discontinue.  You can go on and start the Strattera (atomoxetine) at 40 mg once daily for 1 week and then you can increase to 80 mg once you are no longer on the Pristiq.  The best couple therapy is the [place] approach and the 7 principles of marriage is a good book but they have flash cards that you can download on the [employer].
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
## 2023-03-03 ENCOUNTER — Encounter (HOSPITAL_COMMUNITY): Payer: Self-pay

## 2023-03-06 ENCOUNTER — Telehealth (HOSPITAL_COMMUNITY): Payer: Self-pay | Admitting: Professional

## 2023-03-06 ENCOUNTER — Encounter (HOSPITAL_COMMUNITY): Payer: Self-pay | Admitting: Psychiatry

## 2023-03-06 ENCOUNTER — Telehealth (INDEPENDENT_AMBULATORY_CARE_PROVIDER_SITE_OTHER): Payer: Medicaid Other | Admitting: Psychiatry

## 2023-03-06 DIAGNOSIS — F15982 Other stimulant use, unspecified with stimulant-induced sleep disorder: Secondary | ICD-10-CM

## 2023-03-06 DIAGNOSIS — F4310 Post-traumatic stress disorder, unspecified: Secondary | ICD-10-CM | POA: Diagnosis not present

## 2023-03-06 DIAGNOSIS — F331 Major depressive disorder, recurrent, moderate: Secondary | ICD-10-CM | POA: Diagnosis not present

## 2023-03-06 DIAGNOSIS — Z79899 Other long term (current) drug therapy: Secondary | ICD-10-CM | POA: Diagnosis not present

## 2023-03-06 DIAGNOSIS — F411 Generalized anxiety disorder: Secondary | ICD-10-CM | POA: Diagnosis not present

## 2023-03-06 DIAGNOSIS — F603 Borderline personality disorder: Secondary | ICD-10-CM | POA: Diagnosis not present

## 2023-03-06 DIAGNOSIS — F410 Panic disorder [episodic paroxysmal anxiety] without agoraphobia: Secondary | ICD-10-CM | POA: Diagnosis not present

## 2023-03-06 MED ORDER — DESVENLAFAXINE SUCCINATE ER 100 MG PO TB24
100.0000 mg | ORAL_TABLET | Freq: Every day | ORAL | 2 refills | Status: DC
Start: 2023-03-06 — End: 2023-05-07

## 2023-03-06 MED ORDER — QUETIAPINE FUMARATE 50 MG PO TABS
ORAL_TABLET | ORAL | 1 refills | Status: DC
Start: 2023-03-06 — End: 2023-04-09
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
## 2023-03-06 NOTE — Patient Instructions (Signed)
We restarted the Pristiq (desvenlafaxine) at 100 mg once daily.  We also increased the Seroquel (quetiapine) to 50 mg 3 times a day with a fourth dose available as needed for anxiety or insomnia.  We continued the Strattera at 40 mg once daily.  I also placed a referral to the partial hospitalization program for more intensive therapy for the time being so please be on the lookout for a phone call from them.

## 2023-03-13 ENCOUNTER — Encounter (HOSPITAL_COMMUNITY): Payer: Self-pay

## 2023-03-13 ENCOUNTER — Encounter (HOSPITAL_COMMUNITY): Payer: Self-pay | Admitting: Psychiatry

## 2023-03-13 ENCOUNTER — Telehealth (INDEPENDENT_AMBULATORY_CARE_PROVIDER_SITE_OTHER): Payer: Medicaid Other | Admitting: Psychiatry

## 2023-03-13 DIAGNOSIS — F411 Generalized anxiety disorder: Secondary | ICD-10-CM | POA: Diagnosis not present

## 2023-03-13 DIAGNOSIS — F603 Borderline personality disorder: Secondary | ICD-10-CM | POA: Diagnosis not present

## 2023-03-13 DIAGNOSIS — Z720 Tobacco use: Secondary | ICD-10-CM

## 2023-03-13 DIAGNOSIS — F410 Panic disorder [episodic paroxysmal anxiety] without agoraphobia: Secondary | ICD-10-CM

## 2023-03-13 DIAGNOSIS — F331 Major depressive disorder, recurrent, moderate: Secondary | ICD-10-CM

## 2023-03-13 DIAGNOSIS — Z79899 Other long term (current) drug therapy: Secondary | ICD-10-CM

## 2023-03-13 DIAGNOSIS — F4310 Post-traumatic stress disorder, unspecified: Secondary | ICD-10-CM

## 2023-03-13 NOTE — Patient Instructions (Addendum)
Here is the website that you can use to register an emotional support animal: [email or web address].certapet.com/emotional-support-animal/?[email or web address]  Remember to keep doing the deep breathing exercises with mindfulness techniques to reduce that chest discomfort when anxious.  We will likely continue to adjust your medication when we meet next.
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
## 2023-03-27 ENCOUNTER — Telehealth (HOSPITAL_COMMUNITY): Payer: Medicaid Other | Admitting: Psychiatry

## 2023-03-30 ENCOUNTER — Ambulatory Visit: Payer: Medicaid Other | Admitting: Family Medicine

## 2023-04-09 ENCOUNTER — Encounter (HOSPITAL_COMMUNITY): Payer: Self-pay | Admitting: Psychiatry

## 2023-04-09 ENCOUNTER — Telehealth (INDEPENDENT_AMBULATORY_CARE_PROVIDER_SITE_OTHER): Payer: Medicaid Other | Admitting: Psychiatry

## 2023-04-09 DIAGNOSIS — Z720 Tobacco use: Secondary | ICD-10-CM

## 2023-04-09 DIAGNOSIS — F603 Borderline personality disorder: Secondary | ICD-10-CM | POA: Diagnosis not present

## 2023-04-09 DIAGNOSIS — F4310 Post-traumatic stress disorder, unspecified: Secondary | ICD-10-CM

## 2023-04-09 DIAGNOSIS — F331 Major depressive disorder, recurrent, moderate: Secondary | ICD-10-CM | POA: Diagnosis not present

## 2023-04-09 DIAGNOSIS — F15982 Other stimulant use, unspecified with stimulant-induced sleep disorder: Secondary | ICD-10-CM | POA: Diagnosis not present

## 2023-04-09 DIAGNOSIS — F410 Panic disorder [episodic paroxysmal anxiety] without agoraphobia: Secondary | ICD-10-CM | POA: Diagnosis not present

## 2023-04-09 DIAGNOSIS — Z79899 Other long term (current) drug therapy: Secondary | ICD-10-CM | POA: Diagnosis not present

## 2023-04-09 DIAGNOSIS — F411 Generalized anxiety disorder: Secondary | ICD-10-CM

## 2023-04-09 MED ORDER — QUETIAPINE FUMARATE 50 MG PO TABS: 50.0000 mg | ORAL_TABLET | Freq: Four times a day (QID) | ORAL | 1 refills | Status: DC

## 2023-04-09 NOTE — Patient Instructions (Signed)
We did not make any medication changes today.  I do still think that a huge improvement will be gained if you were able to get established with the therapist.  Ideally this would be a DBT provider but if you cannot find one CBT would work well.  Keep using the DBT manual that I provided.  Do your best to keep trying to cut back on nicotine and caffeine.
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
## 2023-04-19 ENCOUNTER — Ambulatory Visit: Payer: Medicaid Other | Admitting: Family Medicine

## 2023-04-27 DIAGNOSIS — K0889 Other specified disorders of teeth and supporting structures: Secondary | ICD-10-CM | POA: Diagnosis not present

## 2023-04-28 ENCOUNTER — Encounter (HOSPITAL_COMMUNITY): Payer: Self-pay

## 2023-05-03 ENCOUNTER — Other Ambulatory Visit (HOSPITAL_COMMUNITY): Payer: Self-pay | Admitting: Psychiatry

## 2023-05-03 DIAGNOSIS — G2571 Drug induced akathisia: Secondary | ICD-10-CM

## 2023-05-03 DIAGNOSIS — F410 Panic disorder [episodic paroxysmal anxiety] without agoraphobia: Secondary | ICD-10-CM

## 2023-05-03 MED ORDER — PROPRANOLOL HCL 10 MG PO TABS: 10.0000 mg | ORAL_TABLET | Freq: Two times a day (BID) | ORAL | 2 refills | Status: DC | PRN

## 2023-05-03 NOTE — Progress Notes (Signed)
Patient with likely akathetic reaction with titration of Seroquel but as there have been limited medications that have been effective at curbing anxiety for patient to date we will send in propranolol to reduce symptom burden in the meantime.  Propranolol was previously trialed but ineffective when not on current medication regimen so may have have more efficacy for her generalized anxiety with panic attack now.

## 2023-05-07 ENCOUNTER — Telehealth (HOSPITAL_COMMUNITY): Payer: Medicaid Other | Admitting: Psychiatry

## 2023-05-07 ENCOUNTER — Encounter (HOSPITAL_COMMUNITY): Payer: Self-pay | Admitting: Psychiatry

## 2023-05-07 DIAGNOSIS — F331 Major depressive disorder, recurrent, moderate: Secondary | ICD-10-CM | POA: Diagnosis not present

## 2023-05-07 DIAGNOSIS — F4310 Post-traumatic stress disorder, unspecified: Secondary | ICD-10-CM | POA: Diagnosis not present

## 2023-05-07 DIAGNOSIS — G2571 Drug induced akathisia: Secondary | ICD-10-CM

## 2023-05-07 DIAGNOSIS — F411 Generalized anxiety disorder: Secondary | ICD-10-CM

## 2023-05-07 DIAGNOSIS — F15982 Other stimulant use, unspecified with stimulant-induced sleep disorder: Secondary | ICD-10-CM | POA: Diagnosis not present

## 2023-05-07 DIAGNOSIS — Z79899 Other long term (current) drug therapy: Secondary | ICD-10-CM | POA: Diagnosis not present

## 2023-05-07 DIAGNOSIS — T43505A Adverse effect of unspecified antipsychotics and neuroleptics, initial encounter: Secondary | ICD-10-CM

## 2023-05-07 DIAGNOSIS — Z720 Tobacco use: Secondary | ICD-10-CM | POA: Diagnosis not present

## 2023-05-07 DIAGNOSIS — F410 Panic disorder [episodic paroxysmal anxiety] without agoraphobia: Secondary | ICD-10-CM | POA: Diagnosis not present

## 2023-05-07 DIAGNOSIS — F603 Borderline personality disorder: Secondary | ICD-10-CM | POA: Diagnosis not present

## 2023-05-07 HISTORY — DX: Drug induced akathisia: G25.71

## 2023-05-07 MED ORDER — DESVENLAFAXINE SUCCINATE ER 100 MG PO TB24: 100.0000 mg | ORAL_TABLET | Freq: Every day | ORAL | 2 refills | Status: DC

## 2023-05-07 MED ORDER — QUETIAPINE FUMARATE 50 MG PO TABS: 50.0000 mg | ORAL_TABLET | Freq: Three times a day (TID) | ORAL | 2 refills | Status: DC
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
## 2023-06-04 ENCOUNTER — Telehealth (INDEPENDENT_AMBULATORY_CARE_PROVIDER_SITE_OTHER): Payer: Medicaid Other | Admitting: Psychiatry

## 2023-06-04 ENCOUNTER — Encounter (HOSPITAL_COMMUNITY): Payer: Self-pay | Admitting: Psychiatry

## 2023-06-04 DIAGNOSIS — R4589 Other symptoms and signs involving emotional state: Secondary | ICD-10-CM | POA: Diagnosis not present

## 2023-06-04 DIAGNOSIS — T43505A Adverse effect of unspecified antipsychotics and neuroleptics, initial encounter: Secondary | ICD-10-CM

## 2023-06-04 DIAGNOSIS — F411 Generalized anxiety disorder: Secondary | ICD-10-CM | POA: Diagnosis not present

## 2023-06-04 DIAGNOSIS — F603 Borderline personality disorder: Secondary | ICD-10-CM

## 2023-06-04 DIAGNOSIS — F410 Panic disorder [episodic paroxysmal anxiety] without agoraphobia: Secondary | ICD-10-CM | POA: Diagnosis not present

## 2023-06-04 DIAGNOSIS — G2571 Drug induced akathisia: Secondary | ICD-10-CM | POA: Diagnosis not present

## 2023-06-04 DIAGNOSIS — F331 Major depressive disorder, recurrent, moderate: Secondary | ICD-10-CM

## 2023-06-04 DIAGNOSIS — Z720 Tobacco use: Secondary | ICD-10-CM | POA: Diagnosis not present

## 2023-06-04 DIAGNOSIS — F15982 Other stimulant use, unspecified with stimulant-induced sleep disorder: Secondary | ICD-10-CM

## 2023-06-04 MED ORDER — LAMOTRIGINE 25 MG PO TABS: ORAL_TABLET | ORAL | 0 refills | Status: DC

## 2023-06-04 MED ORDER — LAMOTRIGINE 100 MG PO TABS: 100.0000 mg | ORAL_TABLET | Freq: Every day | ORAL | 1 refills | Status: DC

## 2023-06-04 MED ORDER — QUETIAPINE FUMARATE 50 MG PO TABS: 50.0000 mg | ORAL_TABLET | Freq: Two times a day (BID) | ORAL | 2 refills | Status: DC

## 2023-06-04 NOTE — Patient Instructions (Signed)
 We added lamotrigine  25 mg daily for 2 weeks to your regimen today.  After taking this dose for 2 weeks then increase to 50 mg daily for 2 weeks.  Once you complete that bottle start the 100 mg daily dosing.  Please keep trying to cut back on the caffeine you are consuming.  We also decreased the Seroquel  to 50 mg twice daily and you can adjust when you are taking this dose to see what works best for you.  Keep trying to find a therapist that is in network and keep using the DBT workbook.
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
## 2023-06-19 ENCOUNTER — Encounter (HOSPITAL_COMMUNITY): Payer: Self-pay

## 2023-06-25 ENCOUNTER — Encounter (HOSPITAL_COMMUNITY): Payer: Self-pay

## 2023-07-02 ENCOUNTER — Telehealth (INDEPENDENT_AMBULATORY_CARE_PROVIDER_SITE_OTHER): Payer: Medicaid Other | Admitting: Psychiatry

## 2023-07-02 ENCOUNTER — Encounter (HOSPITAL_COMMUNITY): Payer: Self-pay | Admitting: Psychiatry

## 2023-07-02 DIAGNOSIS — F411 Generalized anxiety disorder: Secondary | ICD-10-CM

## 2023-07-02 DIAGNOSIS — F4310 Post-traumatic stress disorder, unspecified: Secondary | ICD-10-CM | POA: Diagnosis not present

## 2023-07-02 DIAGNOSIS — F603 Borderline personality disorder: Secondary | ICD-10-CM

## 2023-07-02 DIAGNOSIS — F17210 Nicotine dependence, cigarettes, uncomplicated: Secondary | ICD-10-CM

## 2023-07-02 DIAGNOSIS — F331 Major depressive disorder, recurrent, moderate: Secondary | ICD-10-CM | POA: Diagnosis not present

## 2023-07-02 DIAGNOSIS — Z720 Tobacco use: Secondary | ICD-10-CM

## 2023-07-02 DIAGNOSIS — F410 Panic disorder [episodic paroxysmal anxiety] without agoraphobia: Secondary | ICD-10-CM | POA: Diagnosis not present

## 2023-07-02 NOTE — Patient Instructions (Signed)
We will plan on increasing the lamotrigine to 100 mg nightly today but if your physical symptoms worsen please let me know and we will likely discontinue the lamotrigine.  I would also touch base with your primary care provider as I cannot rule out that this is a migraine or viral illness.
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
## 2023-07-12 DIAGNOSIS — R509 Fever, unspecified: Secondary | ICD-10-CM | POA: Diagnosis not present

## 2023-07-12 DIAGNOSIS — R6889 Other general symptoms and signs: Secondary | ICD-10-CM | POA: Diagnosis not present

## 2023-07-12 DIAGNOSIS — Z20822 Contact with and (suspected) exposure to covid-19: Secondary | ICD-10-CM | POA: Diagnosis not present

## 2023-07-18 ENCOUNTER — Encounter (HOSPITAL_COMMUNITY): Payer: Self-pay

## 2023-07-19 DIAGNOSIS — R110 Nausea: Secondary | ICD-10-CM | POA: Diagnosis not present

## 2023-07-19 DIAGNOSIS — R42 Dizziness and giddiness: Secondary | ICD-10-CM | POA: Diagnosis not present

## 2023-07-19 DIAGNOSIS — Z885 Allergy status to narcotic agent status: Secondary | ICD-10-CM | POA: Diagnosis not present

## 2023-07-20 ENCOUNTER — Telehealth (HOSPITAL_COMMUNITY): Payer: Medicaid Other | Admitting: Psychiatry

## 2023-07-20 ENCOUNTER — Encounter (HOSPITAL_COMMUNITY): Payer: Self-pay | Admitting: Psychiatry

## 2023-07-20 DIAGNOSIS — F411 Generalized anxiety disorder: Secondary | ICD-10-CM | POA: Diagnosis not present

## 2023-07-20 DIAGNOSIS — Z720 Tobacco use: Secondary | ICD-10-CM

## 2023-07-20 DIAGNOSIS — R42 Dizziness and giddiness: Secondary | ICD-10-CM | POA: Insufficient documentation

## 2023-07-20 DIAGNOSIS — R4589 Other symptoms and signs involving emotional state: Secondary | ICD-10-CM | POA: Diagnosis not present

## 2023-07-20 DIAGNOSIS — F603 Borderline personality disorder: Secondary | ICD-10-CM

## 2023-07-20 DIAGNOSIS — F410 Panic disorder [episodic paroxysmal anxiety] without agoraphobia: Secondary | ICD-10-CM

## 2023-07-20 DIAGNOSIS — F331 Major depressive disorder, recurrent, moderate: Secondary | ICD-10-CM

## 2023-07-20 DIAGNOSIS — F4310 Post-traumatic stress disorder, unspecified: Secondary | ICD-10-CM

## 2023-07-20 MED ORDER — DESVENLAFAXINE SUCCINATE ER 100 MG PO TB24: 100.0000 mg | ORAL_TABLET | Freq: Every day | ORAL | 2 refills | Status: DC

## 2023-07-20 MED ORDER — LAMOTRIGINE 100 MG PO TABS: 100.0000 mg | ORAL_TABLET | Freq: Every day | ORAL | 2 refills | Status: DC
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
## 2023-07-20 NOTE — Patient Instructions (Addendum)
We did not make any medication changes today.  Follow through with a primary care appointment in March and they should be able to provide some more recommendations for the vertigo.  As we discussed, you can go to this website to find a quit Line and they can mail you smoking cessation resources for free: [email or web address]

## 2023-07-24 ENCOUNTER — Encounter (HOSPITAL_COMMUNITY): Payer: Self-pay | Admitting: Psychiatry

## 2023-08-03 ENCOUNTER — Ambulatory Visit: Payer: Medicaid Other | Admitting: Family Medicine

## 2023-08-06 ENCOUNTER — Encounter: Payer: Self-pay | Admitting: Family Medicine

## 2023-08-17 ENCOUNTER — Encounter (HOSPITAL_COMMUNITY): Payer: Self-pay | Admitting: Psychiatry

## 2023-08-17 ENCOUNTER — Telehealth (HOSPITAL_COMMUNITY): Payer: Medicaid Other | Admitting: Psychiatry

## 2023-08-17 DIAGNOSIS — F410 Panic disorder [episodic paroxysmal anxiety] without agoraphobia: Secondary | ICD-10-CM

## 2023-08-17 DIAGNOSIS — F4310 Post-traumatic stress disorder, unspecified: Secondary | ICD-10-CM | POA: Diagnosis not present

## 2023-08-17 DIAGNOSIS — F17210 Nicotine dependence, cigarettes, uncomplicated: Secondary | ICD-10-CM

## 2023-08-17 DIAGNOSIS — F603 Borderline personality disorder: Secondary | ICD-10-CM | POA: Diagnosis not present

## 2023-08-17 DIAGNOSIS — R4589 Other symptoms and signs involving emotional state: Secondary | ICD-10-CM | POA: Diagnosis not present

## 2023-08-17 DIAGNOSIS — F411 Generalized anxiety disorder: Secondary | ICD-10-CM | POA: Diagnosis not present

## 2023-08-17 DIAGNOSIS — Z720 Tobacco use: Secondary | ICD-10-CM

## 2023-08-17 DIAGNOSIS — F331 Major depressive disorder, recurrent, moderate: Secondary | ICD-10-CM

## 2023-08-17 MED ORDER — LAMOTRIGINE 150 MG PO TABS: 150.0000 mg | ORAL_TABLET | Freq: Every day | ORAL | 2 refills | Status: DC
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
## 2023-08-17 NOTE — Patient Instructions (Signed)
 We increased the lamotrigine to 150 mg nightly today.  Keep practicing the 5 senses technique to ground yourself in the present and it can be helpful to make a list of the things that used to struggle more with to see how much progress you have made.  You got this!

## 2023-08-27 ENCOUNTER — Encounter (HOSPITAL_COMMUNITY): Payer: Self-pay

## 2023-09-14 ENCOUNTER — Telehealth (INDEPENDENT_AMBULATORY_CARE_PROVIDER_SITE_OTHER): Admitting: Psychiatry

## 2023-09-14 ENCOUNTER — Encounter (HOSPITAL_COMMUNITY): Payer: Self-pay | Admitting: Psychiatry

## 2023-09-14 DIAGNOSIS — R4589 Other symptoms and signs involving emotional state: Secondary | ICD-10-CM | POA: Diagnosis not present

## 2023-09-14 DIAGNOSIS — F603 Borderline personality disorder: Secondary | ICD-10-CM

## 2023-09-14 DIAGNOSIS — Z720 Tobacco use: Secondary | ICD-10-CM

## 2023-09-14 DIAGNOSIS — F331 Major depressive disorder, recurrent, moderate: Secondary | ICD-10-CM

## 2023-09-14 DIAGNOSIS — F410 Panic disorder [episodic paroxysmal anxiety] without agoraphobia: Secondary | ICD-10-CM

## 2023-09-14 DIAGNOSIS — F17210 Nicotine dependence, cigarettes, uncomplicated: Secondary | ICD-10-CM | POA: Diagnosis not present

## 2023-09-14 DIAGNOSIS — F411 Generalized anxiety disorder: Secondary | ICD-10-CM

## 2023-09-14 DIAGNOSIS — F4310 Post-traumatic stress disorder, unspecified: Secondary | ICD-10-CM

## 2023-09-14 MED ORDER — LAMOTRIGINE 200 MG PO TABS: 200.0000 mg | ORAL_TABLET | Freq: Every day | ORAL | 2 refills | Status: DC

## 2023-09-14 NOTE — Patient Instructions (Signed)
 We increased the lamotrigine  to 200 mg nightly today.  Keep practicing the techniques we went over today to help with the burnout.  Remember good enough mothering and a 30-70 rule.
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
## 2023-09-30 ENCOUNTER — Encounter (HOSPITAL_COMMUNITY): Payer: Self-pay

## 2023-10-08 ENCOUNTER — Telehealth (HOSPITAL_COMMUNITY): Admitting: Psychiatry

## 2023-10-08 ENCOUNTER — Encounter (HOSPITAL_COMMUNITY): Payer: Self-pay | Admitting: Psychiatry

## 2023-10-08 DIAGNOSIS — F411 Generalized anxiety disorder: Secondary | ICD-10-CM | POA: Diagnosis not present

## 2023-10-08 DIAGNOSIS — F603 Borderline personality disorder: Secondary | ICD-10-CM

## 2023-10-08 DIAGNOSIS — F331 Major depressive disorder, recurrent, moderate: Secondary | ICD-10-CM

## 2023-10-08 DIAGNOSIS — F410 Panic disorder [episodic paroxysmal anxiety] without agoraphobia: Secondary | ICD-10-CM

## 2023-10-08 DIAGNOSIS — N943 Premenstrual tension syndrome: Secondary | ICD-10-CM | POA: Insufficient documentation

## 2023-10-08 DIAGNOSIS — Z720 Tobacco use: Secondary | ICD-10-CM

## 2023-10-08 DIAGNOSIS — R4589 Other symptoms and signs involving emotional state: Secondary | ICD-10-CM

## 2023-10-08 MED ORDER — LAMOTRIGINE 200 MG PO TABS: 200.0000 mg | ORAL_TABLET | Freq: Every day | ORAL | 5 refills | Status: AC

## 2023-10-08 MED ORDER — DESVENLAFAXINE SUCCINATE ER 100 MG PO TB24: 100.0000 mg | ORAL_TABLET | Freq: Every day | ORAL | 5 refills | Status: AC
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
## 2023-10-08 NOTE — Patient Instructions (Signed)
 We did not make any medication changes today.  Try to discuss with your OB provider about hormone options that can help with premenstrual syndrome.  If you elect to not go with hormone treatment let me know and we can try the pulse dosing of the Pristiq  which would be an additional half tablet only in the week leading up to your menses.

## 2023-10-10 ENCOUNTER — Encounter (HOSPITAL_COMMUNITY): Payer: Self-pay

## 2023-10-11 ENCOUNTER — Ambulatory Visit: Admitting: Women's Health

## 2023-10-15 ENCOUNTER — Encounter: Payer: Self-pay | Admitting: Family Medicine

## 2023-10-17 ENCOUNTER — Ambulatory Visit: Admitting: Women's Health

## 2023-10-25 ENCOUNTER — Ambulatory Visit: Admitting: Women's Health

## 2023-10-29 DIAGNOSIS — R519 Headache, unspecified: Secondary | ICD-10-CM | POA: Diagnosis not present

## 2023-10-29 DIAGNOSIS — R0982 Postnasal drip: Secondary | ICD-10-CM | POA: Diagnosis not present

## 2023-10-29 DIAGNOSIS — J069 Acute upper respiratory infection, unspecified: Secondary | ICD-10-CM | POA: Diagnosis not present

## 2023-11-07 ENCOUNTER — Other Ambulatory Visit: Payer: Self-pay | Admitting: Family Medicine

## 2023-11-07 DIAGNOSIS — G43009 Migraine without aura, not intractable, without status migrainosus: Secondary | ICD-10-CM

## 2023-11-23 ENCOUNTER — Telehealth: Payer: Self-pay | Admitting: Family Medicine

## 2023-11-23 NOTE — Telephone Encounter (Signed)
Patient put on wait list.

## 2023-11-23 NOTE — Telephone Encounter (Signed)
 Copied from CRM [id number]. Topic: Appointments - Scheduling Inquiry for Clinic >> Nov 23, 2023  2:39 PM Selinda RAMAN wrote: Reason for CRM: The patient called in stating if there are any cancellations for the afternoon of Wednesday July 2nd please call her as she can make it that day.

## 2023-11-26 ENCOUNTER — Ambulatory Visit: Admitting: Family Medicine

## 2023-11-26 ENCOUNTER — Encounter: Payer: Self-pay | Admitting: Family Medicine

## 2023-11-26 VITALS — BP 122/78 | HR 89 | Temp 98.1°F | Ht 64.0 in | Wt 168.0 lb

## 2023-11-26 DIAGNOSIS — G43009 Migraine without aura, not intractable, without status migrainosus: Secondary | ICD-10-CM | POA: Diagnosis not present

## 2023-11-26 DIAGNOSIS — F411 Generalized anxiety disorder: Secondary | ICD-10-CM

## 2023-11-26 DIAGNOSIS — E559 Vitamin D deficiency, unspecified: Secondary | ICD-10-CM | POA: Insufficient documentation

## 2023-11-26 DIAGNOSIS — E611 Iron deficiency: Secondary | ICD-10-CM | POA: Insufficient documentation

## 2023-11-26 DIAGNOSIS — F603 Borderline personality disorder: Secondary | ICD-10-CM | POA: Diagnosis not present

## 2023-11-26 DIAGNOSIS — F410 Panic disorder [episodic paroxysmal anxiety] without agoraphobia: Secondary | ICD-10-CM | POA: Diagnosis not present

## 2023-11-26 DIAGNOSIS — F4310 Post-traumatic stress disorder, unspecified: Secondary | ICD-10-CM

## 2023-11-26 DIAGNOSIS — E538 Deficiency of other specified B group vitamins: Secondary | ICD-10-CM | POA: Diagnosis not present

## 2023-11-26 DIAGNOSIS — F331 Major depressive disorder, recurrent, moderate: Secondary | ICD-10-CM | POA: Diagnosis not present

## 2023-11-26 MED ORDER — RIZATRIPTAN BENZOATE 10 MG PO TABS: 10.0000 mg | ORAL_TABLET | ORAL | 5 refills | Status: AC | PRN

## 2023-11-26 MED ORDER — NURTEC 75 MG PO TBDP: 75.0000 mg | ORAL_TABLET | ORAL | 6 refills | Status: AC

## 2023-11-26 NOTE — Patient Instructions (Signed)
 [facility]  Phone [phone]  Address [address]. [place], [place] [address]  Hours Open 24/7. No appointment required.

## 2023-11-26 NOTE — Progress Notes (Signed)
 Acute Office Visit  Subjective:     Patient ID: Ann Moore, female    DOB: June 21, 1990, 33 y.o.   MRN: [id number]  Chief Complaint  Patient presents with   Medical Management of Chronic Issues    HPI Patient is in today for a chronic follow up.   She has been seeing Dr. Barbra for management of BPD, anxiety, depression. She last had a appointment with him in May. He is transferring to another office. She has an appt with him for follow up in July but is not able to see him until then. She is very tearful today. She reports that she is so angry all time and that it is really affecting her life. She has been compliant with lamictal  and pristiq . She denies SI or HI, plan, or intent but does state that she can't keep going like this. It's ruining my life. She isn't sure what to do at this point.   She has also had more migraines lately with >15 migraine days a month. Rizatriptan  does typically help with her migraines. She has failed excedrin in the past. She has never been on a preventive.       11/26/2023   12:58 PM 01/01/2023    9:07 AM 10/18/2022   10:34 AM  Depression screen PHQ 2/9  Decreased Interest 2 1 3   Down, Depressed, Hopeless 1 1 3   PHQ - 2 Score 3 2 6   Altered sleeping 1 1 3   Tired, decreased energy 1 1 3   Change in appetite 0 0 3  Feeling bad or failure about yourself  1 0 2  Trouble concentrating 1 1 1   Moving slowly or fidgety/restless 0 0 0  Suicidal thoughts 0 0 0  PHQ-9 Score 7 5 18   Difficult doing work/chores Very difficult Somewhat difficult Extremely dIfficult      11/26/2023   12:59 PM 01/01/2023    9:08 AM 09/29/2022    8:51 AM 08/17/2022    8:50 AM  GAD 7 : Generalized Anxiety Score  Nervous, Anxious, on Edge 2 1 2 3   Control/stop worrying 2 1 2 3   Worry too much - different things 1 1 2 2   Trouble relaxing 1 1 3 2   Restless 1 1 2 1   Easily annoyed or irritable 3 1 3 3   Afraid - awful might happen 1 1 1 2   Total GAD 7 Score 11 7 15 16    Anxiety Difficulty Very difficult Somewhat difficult Very difficult Very difficult     ROS As per HPI.     Objective:    BP 122/78   Pulse 89   Temp 98.1 F (36.7 C) (Temporal)   Ht 5' 4 (1.626 m)   Wt 168 lb (76.2 kg)   SpO2 96%   BMI 28.84 kg/m    Physical Exam Vitals and nursing note reviewed.  Constitutional:      General: She is not in acute distress.    Appearance: She is not ill-appearing, toxic-appearing or diaphoretic.  Pulmonary:     Effort: Pulmonary effort is normal. No respiratory distress.   Musculoskeletal:     Right lower leg: No edema.     Left lower leg: No edema.   Skin:    General: Skin is warm and dry.   Neurological:     Mental Status: She is alert and oriented to person, place, and time. Mental status is at baseline.   Psychiatric:        Attention  and Perception: Attention normal.        Mood and Affect: Affect is tearful.        Speech: Speech normal.        Behavior: Behavior is agitated. Behavior is cooperative.        Thought Content: Thought content normal.     No results found for any visits on 11/26/23.      Assessment & Plan:   Ann Moore was seen today for medical management of chronic issues.  Diagnoses and all orders for this visit:  Migraine without aura and without status migrainosus, not intractable Uncontrolled with >15 migraines a month. Amitriptyline and topamax would interact with her current BH medications. Will try nurtec every other day for prevent and continue rizatriptan  prn for breakthrough migraines.  -     Rimegepant Sulfate (NURTEC) 75 MG TBDP; Take 1 tablet (75 mg total) by mouth every other day. -     rizatriptan  (MAXALT ) 10 MG tablet; Take 1 tablet (10 mg total) by mouth as needed for migraine. May repeat in 2 hours if needed  Folate deficiency Vitamin D  deficiency Iron deficiency Declined lab testing today.   Borderline personality disorder (HCC) Generalized anxiety disorder with panic  attacks Moderate episode of recurrent major depressive disorder (HCC) PTSD (post-traumatic stress disorder) Uncontrolled. Waiting for psychiatrist to officially start at new practice in July. She is able to contract verbally for safety. Recommend [facility] walk in clinic for help.     Return in about 3 months (around 02/26/2024) for chronic follow up.  The patient indicates understanding of these issues and agrees with the plan.  Ann CHRISTELLA Search, FNP

## 2023-12-10 DIAGNOSIS — F331 Major depressive disorder, recurrent, moderate: Secondary | ICD-10-CM | POA: Diagnosis not present

## 2023-12-12 ENCOUNTER — Ambulatory Visit: Admitting: Family Medicine

## 2023-12-19 DIAGNOSIS — F4312 Post-traumatic stress disorder, chronic: Secondary | ICD-10-CM | POA: Diagnosis not present

## 2023-12-19 DIAGNOSIS — F39 Unspecified mood [affective] disorder: Secondary | ICD-10-CM | POA: Diagnosis not present

## 2023-12-24 DIAGNOSIS — F331 Major depressive disorder, recurrent, moderate: Secondary | ICD-10-CM | POA: Diagnosis not present

## 2024-01-08 DIAGNOSIS — F4312 Post-traumatic stress disorder, chronic: Secondary | ICD-10-CM | POA: Diagnosis not present

## 2024-01-08 DIAGNOSIS — F331 Major depressive disorder, recurrent, moderate: Secondary | ICD-10-CM | POA: Diagnosis not present

## 2024-01-29 DIAGNOSIS — F331 Major depressive disorder, recurrent, moderate: Secondary | ICD-10-CM | POA: Diagnosis not present

## 2024-01-29 DIAGNOSIS — F4312 Post-traumatic stress disorder, chronic: Secondary | ICD-10-CM | POA: Diagnosis not present

## 2024-01-29 DIAGNOSIS — F39 Unspecified mood [affective] disorder: Secondary | ICD-10-CM | POA: Diagnosis not present

## 2024-02-01 DIAGNOSIS — F331 Major depressive disorder, recurrent, moderate: Secondary | ICD-10-CM | POA: Diagnosis not present

## 2024-02-01 DIAGNOSIS — F4312 Post-traumatic stress disorder, chronic: Secondary | ICD-10-CM | POA: Diagnosis not present

## 2024-02-13 ENCOUNTER — Encounter: Payer: Self-pay | Admitting: Family Medicine

## 2024-02-14 ENCOUNTER — Ambulatory Visit: Admitting: Family Medicine

## 2024-02-18 DIAGNOSIS — F39 Unspecified mood [affective] disorder: Secondary | ICD-10-CM | POA: Diagnosis not present

## 2024-02-18 DIAGNOSIS — F4312 Post-traumatic stress disorder, chronic: Secondary | ICD-10-CM | POA: Diagnosis not present

## 2024-02-18 DIAGNOSIS — F331 Major depressive disorder, recurrent, moderate: Secondary | ICD-10-CM | POA: Diagnosis not present

## 2024-02-27 ENCOUNTER — Ambulatory Visit: Admitting: Family Medicine

## 2024-03-17 DIAGNOSIS — F331 Major depressive disorder, recurrent, moderate: Secondary | ICD-10-CM | POA: Diagnosis not present

## 2024-03-17 DIAGNOSIS — F4312 Post-traumatic stress disorder, chronic: Secondary | ICD-10-CM | POA: Diagnosis not present

## 2024-03-17 DIAGNOSIS — F39 Unspecified mood [affective] disorder: Secondary | ICD-10-CM | POA: Diagnosis not present

## 2024-03-28 DIAGNOSIS — F39 Unspecified mood [affective] disorder: Secondary | ICD-10-CM | POA: Diagnosis not present

## 2024-03-28 DIAGNOSIS — F603 Borderline personality disorder: Secondary | ICD-10-CM | POA: Diagnosis not present

## 2024-03-28 DIAGNOSIS — F4312 Post-traumatic stress disorder, chronic: Secondary | ICD-10-CM | POA: Diagnosis not present

## 2024-03-31 ENCOUNTER — Ambulatory Visit: Admitting: Family Medicine

## 2024-04-07 DIAGNOSIS — J3489 Other specified disorders of nose and nasal sinuses: Secondary | ICD-10-CM | POA: Diagnosis not present

## 2024-04-07 DIAGNOSIS — R42 Dizziness and giddiness: Secondary | ICD-10-CM | POA: Diagnosis not present

## 2024-04-15 DIAGNOSIS — F603 Borderline personality disorder: Secondary | ICD-10-CM | POA: Diagnosis not present

## 2024-04-15 DIAGNOSIS — F331 Major depressive disorder, recurrent, moderate: Secondary | ICD-10-CM | POA: Diagnosis not present

## 2024-04-15 DIAGNOSIS — F39 Unspecified mood [affective] disorder: Secondary | ICD-10-CM | POA: Diagnosis not present

## 2024-04-15 DIAGNOSIS — F4312 Post-traumatic stress disorder, chronic: Secondary | ICD-10-CM | POA: Diagnosis not present

## 2024-04-28 DIAGNOSIS — F4312 Post-traumatic stress disorder, chronic: Secondary | ICD-10-CM | POA: Diagnosis not present

## 2024-04-28 DIAGNOSIS — F331 Major depressive disorder, recurrent, moderate: Secondary | ICD-10-CM | POA: Diagnosis not present

## 2024-04-28 DIAGNOSIS — F603 Borderline personality disorder: Secondary | ICD-10-CM | POA: Diagnosis not present

## 2024-05-12 DIAGNOSIS — F4312 Post-traumatic stress disorder, chronic: Secondary | ICD-10-CM | POA: Diagnosis not present

## 2024-05-12 DIAGNOSIS — F331 Major depressive disorder, recurrent, moderate: Secondary | ICD-10-CM | POA: Diagnosis not present

## 2024-06-18 ENCOUNTER — Telehealth: Payer: Self-pay | Admitting: *Deleted

## 2024-06-18 DIAGNOSIS — G43909 Migraine, unspecified, not intractable, without status migrainosus: Secondary | ICD-10-CM

## 2024-06-18 DIAGNOSIS — F410 Panic disorder [episodic paroxysmal anxiety] without agoraphobia: Secondary | ICD-10-CM

## 2024-06-18 NOTE — Progress Notes (Signed)
 Complex Care Management Note Care Guide Note  06/18/2024 Name: ANAHY ESH MRN: [id number] DOB: 15-Jan-1991   Complex Care Management Outreach Attempts: An unsuccessful telephone outreach was attempted today to offer the patient information about available complex care management services.  Follow Up Plan:  Additional outreach attempts will be made to offer the patient complex care management information and services.   Encounter Outcome:  No Answer  Harlene Satterfield  [facility]  [facility], [facility] Guide  Direct Dial: [phone]  Fax [phone]

## 2024-06-19 NOTE — Progress Notes (Signed)
 Complex Care Management Note Care Guide Note  06/19/2024 Name: Ann Moore MRN: [id number] DOB: 1990/08/29   Complex Care Management Outreach Attempts: A second unsuccessful outreach was attempted today to offer the patient with information about available complex care management services.  Follow Up Plan:  Additional outreach attempts will be made to offer the patient complex care management information and services.   Encounter Outcome:  No Answer  Harlene Satterfield  [facility]  [facility], [facility] Guide  Direct Dial: [phone]  Fax [phone]

## 2024-06-24 NOTE — Progress Notes (Signed)
 Complex Care Management Note  Care Guide Note 06/24/2024 Name: Ann Moore MRN: [id number] DOB: 1991-01-19  Ann Moore is a 34 y.o. year old female who sees Joesph Annabella HERO, FNP for primary care. I reached out to Ann Moore by phone today to offer complex care management services.  [name] was given information about Complex Care Management services today including:   The Complex Care Management services include support from the care team which includes your Nurse Care Manager, Clinical Social Worker, or Pharmacist.  The Complex Care Management team is here to help remove barriers to the health concerns and goals most important to you. Complex Care Management services are voluntary, and the patient may decline or stop services at any time by request to their care team member.   Complex Care Management Consent Status: Patient agreed to services and verbal consent obtained.   Follow up plan:  Telephone appointment with complex care management team member scheduled for:  07/03/24  Encounter Outcome:  Patient Scheduled  Harlene Satterfield  [facility]  [facility], [facility] Guide  Direct Dial: [phone]  Fax [phone]

## 2024-07-03 ENCOUNTER — Telehealth: Payer: Self-pay | Admitting: *Deleted

## 2024-07-03 ENCOUNTER — Encounter: Payer: Self-pay | Admitting: *Deleted

## 2024-07-03 NOTE — Patient Instructions (Signed)
 Ann Moore - I am sorry I was unable to reach you today for our scheduled appointment. I work with Joesph Annabella HERO, FNP and am calling to support your healthcare needs. Please contact me at [phone] at your earliest convenience. I look forward to speaking with you soon.   Thank you,  Rosina Forte, BSN RN [facility], [facility] RN Care Manager Direct Dial: [phone]  Fax: [phone]

## 2024-08-06 ENCOUNTER — Encounter: Admitting: Obstetrics and Gynecology
